# Patient Record
Sex: Female | Born: 1950
Health system: Southern US, Community
[De-identification: ages and names within clinical notes are randomized; demographics above are authoritative.]

## PROBLEM LIST (undated history)

## (undated) DIAGNOSIS — E785 Hyperlipidemia, unspecified: Secondary | ICD-10-CM

## (undated) DIAGNOSIS — R569 Unspecified convulsions: Secondary | ICD-10-CM

## (undated) DIAGNOSIS — I10 Essential (primary) hypertension: Secondary | ICD-10-CM

## (undated) HISTORY — PX: ABDOMINAL HYSTERECTOMY: SHX81

## (undated) HISTORY — PX: BREAST CYST ASPIRATION: SHX578

---
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
## 1997-06-29 ENCOUNTER — Other Ambulatory Visit: Admission: RE | Admit: 1997-06-29 | Discharge: 1997-06-29 | Payer: Self-pay | Admitting: Obstetrics

## 1997-09-03 ENCOUNTER — Other Ambulatory Visit: Admission: RE | Admit: 1997-09-03 | Discharge: 1997-09-03 | Payer: Self-pay | Admitting: Nephrology

## 1998-01-28 ENCOUNTER — Ambulatory Visit (HOSPITAL_COMMUNITY): Admission: RE | Admit: 1998-01-28 | Discharge: 1998-01-28 | Payer: Self-pay | Admitting: Obstetrics

## 1998-01-28 ENCOUNTER — Encounter: Payer: Self-pay | Admitting: Obstetrics

## 1999-01-31 ENCOUNTER — Ambulatory Visit (HOSPITAL_COMMUNITY): Admission: RE | Admit: 1999-01-31 | Discharge: 1999-01-31 | Payer: Self-pay | Admitting: Nephrology

## 1999-02-17 ENCOUNTER — Other Ambulatory Visit: Admission: RE | Admit: 1999-02-17 | Discharge: 1999-02-17 | Payer: Self-pay | Admitting: Nephrology

## 1999-03-24 ENCOUNTER — Encounter: Payer: Self-pay | Admitting: Emergency Medicine

## 1999-03-24 ENCOUNTER — Emergency Department (HOSPITAL_COMMUNITY): Admission: EM | Admit: 1999-03-24 | Discharge: 1999-03-24 | Payer: Self-pay | Admitting: Emergency Medicine

## 1999-04-22 ENCOUNTER — Encounter: Admission: RE | Admit: 1999-04-22 | Discharge: 1999-05-13 | Payer: Self-pay

## 1999-07-28 ENCOUNTER — Other Ambulatory Visit: Admission: RE | Admit: 1999-07-28 | Discharge: 1999-07-28 | Payer: Self-pay | Admitting: Obstetrics

## 2000-02-01 ENCOUNTER — Encounter: Payer: Self-pay | Admitting: Obstetrics

## 2000-02-01 ENCOUNTER — Ambulatory Visit (HOSPITAL_COMMUNITY): Admission: RE | Admit: 2000-02-01 | Discharge: 2000-02-01 | Payer: Self-pay | Admitting: Obstetrics

## 2000-02-24 ENCOUNTER — Encounter: Payer: Self-pay | Admitting: Nephrology

## 2000-02-24 ENCOUNTER — Ambulatory Visit (HOSPITAL_COMMUNITY): Admission: RE | Admit: 2000-02-24 | Discharge: 2000-02-24 | Payer: Self-pay | Admitting: Nephrology

## 2000-02-27 ENCOUNTER — Encounter: Payer: Self-pay | Admitting: Nephrology

## 2000-02-27 ENCOUNTER — Ambulatory Visit (HOSPITAL_COMMUNITY): Admission: RE | Admit: 2000-02-27 | Discharge: 2000-02-27 | Payer: Self-pay | Admitting: Nephrology

## 2000-03-12 ENCOUNTER — Ambulatory Visit (HOSPITAL_COMMUNITY): Admission: RE | Admit: 2000-03-12 | Discharge: 2000-03-12 | Payer: Self-pay | Admitting: Nephrology

## 2000-03-12 ENCOUNTER — Encounter: Payer: Self-pay | Admitting: Nephrology

## 2001-02-01 ENCOUNTER — Ambulatory Visit (HOSPITAL_COMMUNITY): Admission: RE | Admit: 2001-02-01 | Discharge: 2001-02-01 | Payer: Self-pay | Admitting: Obstetrics

## 2001-02-01 ENCOUNTER — Encounter: Payer: Self-pay | Admitting: Obstetrics

## 2002-02-03 ENCOUNTER — Ambulatory Visit (HOSPITAL_COMMUNITY): Admission: RE | Admit: 2002-02-03 | Discharge: 2002-02-03 | Payer: Self-pay | Admitting: Obstetrics

## 2002-02-03 ENCOUNTER — Encounter: Payer: Self-pay | Admitting: Obstetrics

## 2002-09-22 ENCOUNTER — Other Ambulatory Visit: Admission: RE | Admit: 2002-09-22 | Discharge: 2002-09-22 | Payer: Self-pay | Admitting: Cardiology

## 2003-02-01 ENCOUNTER — Emergency Department (HOSPITAL_COMMUNITY): Admission: EM | Admit: 2003-02-01 | Discharge: 2003-02-01 | Payer: Self-pay | Admitting: Emergency Medicine

## 2003-10-13 ENCOUNTER — Other Ambulatory Visit: Admission: RE | Admit: 2003-10-13 | Discharge: 2003-10-13 | Payer: Self-pay | Admitting: Cardiology

## 2003-10-13 ENCOUNTER — Other Ambulatory Visit: Admission: RE | Admit: 2003-10-13 | Discharge: 2003-10-13 | Payer: Self-pay | Admitting: Nephrology

## 2003-12-17 ENCOUNTER — Ambulatory Visit (HOSPITAL_COMMUNITY): Admission: RE | Admit: 2003-12-17 | Discharge: 2003-12-17 | Payer: Self-pay | Admitting: Obstetrics

## 2004-11-11 ENCOUNTER — Other Ambulatory Visit: Admission: RE | Admit: 2004-11-11 | Discharge: 2004-11-11 | Payer: Self-pay | Admitting: Nephrology

## 2004-12-19 ENCOUNTER — Ambulatory Visit (HOSPITAL_COMMUNITY): Admission: RE | Admit: 2004-12-19 | Discharge: 2004-12-19 | Payer: Self-pay | Admitting: Obstetrics

## 2005-08-02 ENCOUNTER — Encounter: Admission: RE | Admit: 2005-08-02 | Discharge: 2005-08-02 | Payer: Self-pay | Admitting: Nephrology

## 2005-09-07 ENCOUNTER — Encounter: Admission: RE | Admit: 2005-09-07 | Discharge: 2005-09-07 | Payer: Self-pay | Admitting: Nephrology

## 2005-12-08 ENCOUNTER — Other Ambulatory Visit: Admission: RE | Admit: 2005-12-08 | Discharge: 2005-12-08 | Payer: Self-pay | Admitting: Cardiology

## 2005-12-18 ENCOUNTER — Ambulatory Visit (HOSPITAL_COMMUNITY): Admission: RE | Admit: 2005-12-18 | Discharge: 2005-12-18 | Payer: Self-pay | Admitting: Obstetrics

## 2006-12-20 ENCOUNTER — Ambulatory Visit (HOSPITAL_COMMUNITY): Admission: RE | Admit: 2006-12-20 | Discharge: 2006-12-20 | Payer: Self-pay | Admitting: Obstetrics

## 2007-12-23 ENCOUNTER — Ambulatory Visit (HOSPITAL_COMMUNITY): Admission: RE | Admit: 2007-12-23 | Discharge: 2007-12-23 | Payer: Self-pay | Admitting: Obstetrics

## 2008-01-21 ENCOUNTER — Encounter: Admission: RE | Admit: 2008-01-21 | Discharge: 2008-01-21 | Payer: Self-pay | Admitting: Nephrology

## 2008-04-09 ENCOUNTER — Encounter: Admission: RE | Admit: 2008-04-09 | Discharge: 2008-04-21 | Payer: Self-pay | Admitting: Otolaryngology

## 2008-12-23 ENCOUNTER — Ambulatory Visit (HOSPITAL_COMMUNITY): Admission: RE | Admit: 2008-12-23 | Discharge: 2008-12-23 | Payer: Self-pay | Admitting: Obstetrics

## 2009-12-27 ENCOUNTER — Ambulatory Visit (HOSPITAL_COMMUNITY): Admission: RE | Admit: 2009-12-27 | Discharge: 2009-12-27 | Payer: Self-pay | Admitting: Obstetrics

## 2010-12-02 ENCOUNTER — Other Ambulatory Visit (HOSPITAL_COMMUNITY): Payer: Self-pay | Admitting: Obstetrics

## 2010-12-02 DIAGNOSIS — Z1231 Encounter for screening mammogram for malignant neoplasm of breast: Secondary | ICD-10-CM

## 2011-01-02 ENCOUNTER — Ambulatory Visit (HOSPITAL_COMMUNITY)
Admission: RE | Admit: 2011-01-02 | Discharge: 2011-01-02 | Disposition: A | Discharge status: Home / Self Care | Payer: Medicare Other | Source: Ambulatory Visit | Attending: Obstetrics | Admitting: Obstetrics

## 2011-01-02 DIAGNOSIS — Z1231 Encounter for screening mammogram for malignant neoplasm of breast: Secondary | ICD-10-CM | POA: Insufficient documentation

## 2011-05-08 ENCOUNTER — Other Ambulatory Visit: Payer: Self-pay | Admitting: Specialist

## 2011-05-08 DIAGNOSIS — H5340 Unspecified visual field defects: Secondary | ICD-10-CM

## 2011-05-08 LAB — CREATININE, SERUM: Creat: 1.4 mg/dL — ABNORMAL HIGH (ref 0.50–1.10)

## 2011-05-08 LAB — BUN: BUN: 27 mg/dL — ABNORMAL HIGH (ref 6–23)

## 2011-05-09 ENCOUNTER — Ambulatory Visit
Admission: RE | Admit: 2011-05-09 | Discharge: 2011-05-09 | Disposition: A | Discharge status: Home / Self Care | Payer: Medicare Other | Source: Ambulatory Visit | Attending: Specialist | Admitting: Specialist

## 2011-05-09 DIAGNOSIS — H5340 Unspecified visual field defects: Secondary | ICD-10-CM

## 2011-05-09 MED ORDER — GADOBENATE DIMEGLUMINE 529 MG/ML IV SOLN
15.0000 mL | Freq: Once | INTRAVENOUS | Status: AC | PRN
  Administered 2011-05-09: 15 mL via INTRAVENOUS

## 2011-12-21 ENCOUNTER — Other Ambulatory Visit (HOSPITAL_COMMUNITY): Payer: Self-pay | Admitting: Obstetrics

## 2011-12-21 DIAGNOSIS — Z1231 Encounter for screening mammogram for malignant neoplasm of breast: Secondary | ICD-10-CM

## 2012-01-23 ENCOUNTER — Ambulatory Visit (HOSPITAL_COMMUNITY)
Admission: RE | Admit: 2012-01-23 | Discharge: 2012-01-23 | Disposition: A | Discharge status: Home / Self Care | Payer: Medicare Other | Source: Ambulatory Visit | Attending: Obstetrics | Admitting: Obstetrics

## 2012-01-23 DIAGNOSIS — Z1231 Encounter for screening mammogram for malignant neoplasm of breast: Secondary | ICD-10-CM | POA: Insufficient documentation

## 2012-12-16 ENCOUNTER — Other Ambulatory Visit (HOSPITAL_COMMUNITY): Payer: Self-pay | Admitting: Obstetrics

## 2012-12-16 DIAGNOSIS — Z1231 Encounter for screening mammogram for malignant neoplasm of breast: Secondary | ICD-10-CM

## 2013-01-23 ENCOUNTER — Ambulatory Visit (HOSPITAL_COMMUNITY)
Admission: RE | Admit: 2013-01-23 | Discharge: 2013-01-23 | Disposition: A | Discharge status: Home / Self Care | Payer: Medicare Other | Source: Ambulatory Visit | Attending: Obstetrics | Admitting: Obstetrics

## 2013-01-23 DIAGNOSIS — Z1231 Encounter for screening mammogram for malignant neoplasm of breast: Secondary | ICD-10-CM

## 2013-02-01 ENCOUNTER — Emergency Department (HOSPITAL_COMMUNITY)
Admission: EM | Admit: 2013-02-01 | Discharge: 2013-02-01 | Disposition: A | Discharge status: Home / Self Care | Payer: Medicare Other | Attending: Emergency Medicine | Admitting: Emergency Medicine

## 2013-02-01 ENCOUNTER — Encounter (HOSPITAL_COMMUNITY): Payer: Self-pay | Admitting: Emergency Medicine

## 2013-02-01 DIAGNOSIS — R569 Unspecified convulsions: Secondary | ICD-10-CM | POA: Insufficient documentation

## 2013-02-01 DIAGNOSIS — Y929 Unspecified place or not applicable: Secondary | ICD-10-CM | POA: Insufficient documentation

## 2013-02-01 DIAGNOSIS — I10 Essential (primary) hypertension: Secondary | ICD-10-CM | POA: Insufficient documentation

## 2013-02-01 DIAGNOSIS — H1132 Conjunctival hemorrhage, left eye: Secondary | ICD-10-CM

## 2013-02-01 DIAGNOSIS — Y939 Activity, unspecified: Secondary | ICD-10-CM | POA: Insufficient documentation

## 2013-02-01 DIAGNOSIS — S058X9A Other injuries of unspecified eye and orbit, initial encounter: Secondary | ICD-10-CM | POA: Insufficient documentation

## 2013-02-01 DIAGNOSIS — S0502XA Injury of conjunctiva and corneal abrasion without foreign body, left eye, initial encounter: Secondary | ICD-10-CM

## 2013-02-01 DIAGNOSIS — IMO0002 Reserved for concepts with insufficient information to code with codable children: Secondary | ICD-10-CM | POA: Insufficient documentation

## 2013-02-01 DIAGNOSIS — H1189 Other specified disorders of conjunctiva: Secondary | ICD-10-CM | POA: Insufficient documentation

## 2013-02-01 DIAGNOSIS — E785 Hyperlipidemia, unspecified: Secondary | ICD-10-CM | POA: Insufficient documentation

## 2013-02-01 HISTORY — DX: Essential (primary) hypertension: I10

## 2013-02-01 HISTORY — DX: Unspecified convulsions: R56.9

## 2013-02-01 HISTORY — DX: Hyperlipidemia, unspecified: E78.5

## 2013-02-01 MED ORDER — CIPROFLOXACIN HCL 0.3 % OP SOLN
2.0000 [drp] | Freq: Once | OPHTHALMIC | Status: AC
  Administered 2013-02-01: 2 [drp] via OPHTHALMIC
  Filled 2013-02-01: qty 2.5

## 2013-02-01 MED ORDER — TETRACAINE HCL 0.5 % OP SOLN
2.0000 [drp] | Freq: Once | OPHTHALMIC | Status: DC
  Filled 2013-02-01: qty 2

## 2013-02-01 MED ORDER — FLUORESCEIN SODIUM 1 MG OP STRP
1.0000 | ORAL_STRIP | Freq: Once | OPHTHALMIC | Status: DC
  Filled 2013-02-01: qty 1

## 2013-02-01 NOTE — ED Provider Notes (Signed)
CSN: [id number]     Arrival date & time 02/01/13  1831 History  This chart was scribed for non-physician practitioner Michele Mcalpine, PA-C, working with Babette Relic, MD by Zettie Pho, ED Scribe. This patient was seen in room TR07C/TR07C and the patient's care was started at 7:23 PM.    Chief Complaint  Patient presents with  . Eye Injury   The history is provided by the patient and the spouse. No language interpreter was used.   HPI Comments: Deborah Webb is a 62 y.o. female with a history of seizures who presents to the Emergency Department complaining of an injury to the left eye that occurred PTA when she states that she was hit by a metal clothes hanger. She reports associated redness to the left eye. She denies pain to the area or visual disturbance. Patient reports that she does currently have an ophthalmologist.   Past Medical History  Diagnosis Date  . Seizures   . Hypertension   . Hyperlipidemia    History reviewed. No pertinent past surgical history. No family history on file. History  Substance Use Topics  . Smoking status: Never Smoker   . Smokeless tobacco: Not on file  . Alcohol Use: No   OB History   Grav Para Term Preterm Abortions TAB SAB Ect Mult Living                 Review of Systems  A complete 10 system review of systems was obtained and all systems are negative except as noted in the HPI and PMH.   Allergies  Review of patient's allergies indicates no known allergies.  Home Medications  No current outpatient prescriptions on file.  Triage Vitals: BP 122/75  Pulse 72  Temp(Src) 97.9 F (36.6 C) (Oral)  Resp 16  SpO2 98%  Physical Exam  Nursing note and vitals reviewed. Constitutional: She is oriented to person, place, and time. She appears well-developed and well-nourished. No distress.  HENT:  Head: Normocephalic and atraumatic.  Mouth/Throat: Oropharynx is clear and moist.  Eyes: Conjunctivae and EOM are normal. Pupils are equal,  round, and reactive to light.  Left eye subconjunctival hemorrhage noted medially. Eye stained, left corneal abrasion seen. No foreign bodies.   Neck: Normal range of motion. Neck supple.  Cardiovascular: Normal rate, regular rhythm and normal heart sounds.   Pulmonary/Chest: Effort normal and breath sounds normal. No respiratory distress.  Musculoskeletal: Normal range of motion. She exhibits no edema.  Neurological: She is alert and oriented to person, place, and time. No sensory deficit.  Skin: Skin is warm and dry.  Psychiatric: She has a normal mood and affect. Her behavior is normal.    ED Course  Procedures (including critical care time)  DIAGNOSTIC STUDIES: Oxygen Saturation is 98% on room air, normal by my interpretation.    COORDINATION OF CARE: 7:24 PM- Ordered and applied tetracaine drops and a fluorescein strip. Discussed that there is a small scratch on the eye. Will discharge patient with antibiotic eye drops. Advised patient to follow up with her ophthalmologist next week. Discussed treatment plan with patient at bedside and patient verbalized agreement.     Labs Review Labs Reviewed - No data to display Imaging Review No results found.  EKG Interpretation   None       MDM   1. Corneal abrasion, left, initial encounter   2. Subconjunctival hematoma, left    L eye stained, corneal abrasion seen. No FB. Also with subconjunctival hemorrhage.  No hyphema. No vision changes. Well appearing, NAD, normal vitals. Stable for discharge. She has an ophthalmologist who she will followup with next week. Return precautions given. Patient states understanding of treatment care plan and is agreeable.   I personally performed the services described in this documentation, which was scribed in my presence. The recorded information has been reviewed and is accurate.     Illene Labrador, PA-C 02/01/13 [phone]

## 2013-02-01 NOTE — ED Notes (Addendum)
Pt was hit on the L eye by a metal clothes rack.  Pt has redness/blood on left eye.  Pt denies pain and any other injury to face.  Pt denies visual disturbance.

## 2013-02-01 NOTE — Discharge Instructions (Signed)
Use antibiotic eye drops 4 times daily for 5 days. Follow up with your eye doctor, call Monday to schedule an appointment.  Corneal Abrasion The cornea is the clear covering at the front and center of the eye. When looking at the colored portion (iris) of the eye, you are looking through that person's cornea.  This very thin tissue is made up of many layers. The surface layer is a single layer of cells called the corneal epithelium. This is one of the most sensitive tissues in the body. If a scratch or injury causes the corneal epithelium to come off, it is called a corneal abrasion. If the injury extends to the tissues below the epithelium, the condition is called a corneal ulcer.  CAUSES   Scratches.  Trauma.  Foreign body in the eye.  Some people have recurrences of abrasions in the area of the original injury even after they heal. This is called recurrent erosion syndrome. Recurrent erosion syndromes generally improve and go away with time. SYMPTOMS   Eye pain.  Difficulty or inability to keep the injured eye open.  The eye becomes very sensitive to light.  Recurrent erosions tend to happen suddenly, first thing in the morning  usually upon awakening and opening the eyes. DIAGNOSIS  Your eye professional can diagnose a corneal abrasion during an eye exam. Dye is usually placed in the eye using a drop or a small paper strip moistened by the patient's tears. When the eye is examined with a special light, the abrasion shows up clearly because of the dye. TREATMENT   Small abrasions may be treated with antibiotic drops or ointment alone.  Usually a pressure patch is specially applied. Pressure patches prevent the eye from blinking, allowing the corneal epithelium to heal. Because blinking is less, a pressure patch also reduces the amount of pain present in the eye during healing. Most corneal abrasions heal within 2-3 days with no effect on vision. WARNING: Do not drive or operate  machinery while your eye is patched. Your ability to judge distances is impaired.  If abrasion becomes infected and spreads to the deeper tissues of the cornea, a corneal ulcer can result. This is serious because it can cause corneal scarring. Corneal scars interfere with light passing through the cornea, and cause a loss of vision in the involved eye.  If your caregiver has given you a follow-up appointment, it is very important to keep that appointment. Not keeping the appointment could result in a severe eye infection or permanent loss of vision. If there is any problem keeping the appointment, you must call back to this facility for assistance. SEEK MEDICAL CARE IF:   You have pain, light sensitivity and a scratchy feeling in one eye (or both).  Your pressure patch keeps loosening up and you can blink your eye under the patch after treatment.  Any kind of discharge develops from the involved eye after treatment or if the lids stick together in the morning.  You have the same symptoms in the morning as you did with the original abrasion days, weeks or months after the abrasion healed. MAKE SURE YOU:   Understand these instructions.  Will watch your condition.  Will get help right away if you are not doing well or get worse. Document Released: 02/25/2000 Document Revised: 05/22/2011 Document Reviewed: 10/03/2007 [facility] Patient Information 2014 [place].  Subconjunctival Hemorrhage A subconjunctival hemorrhage is a bright red patch covering a portion of the white of the eye. The white  part of the eye is called the sclera, and it is covered by a thin membrane called the conjunctiva. This membrane is clear, except for tiny blood vessels that you can see with the naked eye. When your eye is irritated or inflamed and becomes red, it is because the vessels in the conjunctiva are swollen. Sometimes, a blood vessel in the conjunctiva can break and bleed. When this occurs, the blood  builds up between the conjunctiva and the sclera, and spreads out to create a red area. The red spot may be very small at first. It may then spread to cover a larger part of the surface of the eye, or even all of the visible white part of the eye. In almost all cases, the blood will go away and the eye will become white again. Before completely dissolving, however, the red area may spread. It may also become brownish-yellow in color, before going away. If a lot of blood collects under the conjunctiva, it may look like a bulge on the surface of the eye. This looks scary, but it will also eventually flatten out and go away. Subconjunctival hemorrhages do not cause pain, but if swollen, may cause a feeling of irritation. There is no effect on vision.  CAUSES   The most common cause is mild trauma (rubbing the eye, irritation).  Subconjunctival hemorrhages can happen because of coughing or straining (lifting heavy objects), vomiting, or sneezing.  In some cases, your doctor may want to check your blood pressure. High blood pressure can also cause a sunconjunctival hemorrhage.  Severe trauma or blunt injuries.  Diseases that affect blood clotting (hemophilia, leukemia).  Abnormalities of blood vessels behind the eye (carotid cavernous sinus fistula).  Tumors behind the eye.  Certain drugs (aspirin, coumadin, heparin).  Recent eye surgery. HOME CARE INSTRUCTIONS   Do not worry about the appearance of your eye. You may continue your usual activities.  Often, follow-up is not necessary. SEEK MEDICAL CARE IF:   Your eye becomes painful.  The bleeding does not disappear within 3 weeks.  Bleeding occurs elsewhere, for example, under the skin, in the mouth, or in the other eye.  You have recurring subconjunctival hemorrhages. SEEK IMMEDIATE MEDICAL CARE IF:   Your vision changes or you have difficulty seeing.  You develop severe headache, persistent vomiting, confusion, or abnormal  drowsiness (lethargy).  Your eye seems to bulge or protrude from the eye socket.  You notice the sudden appearance of bruises, or have spontaneous bleeding elsewhere on your body. Document Released: 02/27/2005 Document Revised: 05/22/2011 Document Reviewed: 01/25/2009 [facility] Patient Information 2014 [place].

## 2013-02-02 NOTE — ED Provider Notes (Signed)
Medical screening examination/treatment/procedure(s) were performed by non-physician practitioner and as supervising physician I was immediately available for consultation/collaboration.  Babette Relic, MD 02/02/13 [phone]

## 2013-06-07 ENCOUNTER — Encounter (HOSPITAL_COMMUNITY): Payer: Self-pay | Admitting: Emergency Medicine

## 2013-06-07 ENCOUNTER — Emergency Department (HOSPITAL_COMMUNITY)
Admission: EM | Admit: 2013-06-07 | Discharge: 2013-06-08 | Disposition: A | Discharge status: Home / Self Care | Payer: Medicare Other | Attending: Emergency Medicine | Admitting: Emergency Medicine

## 2013-06-07 DIAGNOSIS — E785 Hyperlipidemia, unspecified: Secondary | ICD-10-CM | POA: Insufficient documentation

## 2013-06-07 DIAGNOSIS — M25572 Pain in left ankle and joints of left foot: Secondary | ICD-10-CM

## 2013-06-07 DIAGNOSIS — Y929 Unspecified place or not applicable: Secondary | ICD-10-CM | POA: Insufficient documentation

## 2013-06-07 DIAGNOSIS — I10 Essential (primary) hypertension: Secondary | ICD-10-CM | POA: Insufficient documentation

## 2013-06-07 DIAGNOSIS — Y9389 Activity, other specified: Secondary | ICD-10-CM | POA: Insufficient documentation

## 2013-06-07 DIAGNOSIS — S8990XA Unspecified injury of unspecified lower leg, initial encounter: Secondary | ICD-10-CM | POA: Insufficient documentation

## 2013-06-07 DIAGNOSIS — S99929A Unspecified injury of unspecified foot, initial encounter: Principal | ICD-10-CM

## 2013-06-07 DIAGNOSIS — S99919A Unspecified injury of unspecified ankle, initial encounter: Principal | ICD-10-CM

## 2013-06-07 DIAGNOSIS — X500XXA Overexertion from strenuous movement or load, initial encounter: Secondary | ICD-10-CM | POA: Insufficient documentation

## 2013-06-07 DIAGNOSIS — Z79899 Other long term (current) drug therapy: Secondary | ICD-10-CM | POA: Insufficient documentation

## 2013-06-07 DIAGNOSIS — G40909 Epilepsy, unspecified, not intractable, without status epilepticus: Secondary | ICD-10-CM | POA: Insufficient documentation

## 2013-06-07 NOTE — ED Notes (Signed)
Patient presents stating she tripped and hurt her left ankle.  Ice given to apply to ankle.  Ankle swollen

## 2013-06-07 NOTE — ED Provider Notes (Signed)
CSN: [id number]     Arrival date & time 06/07/13  2251 History  This chart was scribed for non-physician practitioner, Vernie Murders, PA-C working with Dot Lanes, MD by Frederich Balding, ED scribe. This patient was seen in room TR04C/TR04C and the patient's care was started at 11:56 PM.     Chief Complaint  Patient presents with  . Ankle Pain   The history is provided by the patient. No language interpreter was used.   HPI Comments: Deborah Webb is a 63 y.o. female with a PMH of HTN, seizurs and hyperlipidemia who presents to the Emergency Department complaining of left ankle injury that occurred earlier today around 2:30 PM. Pt tripped and twisted her left ankle. She has sudden onset left ankle pain with associated swelling. No other injuries. No head injury or LOC. Bearing weight and certain movements worsen the pain. Pt has not taken any medications for the pain. Denies abdominal pain, hip pain, numbness, tingling, or loss of sensation. Denies history of ankle fractures or dislocations.    Past Medical History  Diagnosis Date  . Seizures   . Hypertension   . Hyperlipidemia    History reviewed. No pertinent past surgical history. History reviewed. No pertinent family history. History  Substance Use Topics  . Smoking status: Never Smoker   . Smokeless tobacco: Not on file  . Alcohol Use: No   OB History   Grav Para Term Preterm Abortions TAB SAB Ect Mult Living                 Review of Systems  Constitutional: Negative for fatigue.  Cardiovascular: Negative for leg swelling.  Gastrointestinal: Negative for nausea, vomiting and abdominal pain.  Musculoskeletal: Positive for arthralgias (left ankle) and joint swelling. Negative for back pain, gait problem, myalgias and neck pain.  Skin: Negative for color change and wound.  Neurological: Negative for weakness, numbness and headaches.  All other systems reviewed and are negative.   Allergies  Review of patient's  allergies indicates no known allergies.  Home Medications   Current Outpatient Rx  Name  Route  Sig  Dispense  Refill  . atorvastatin (LIPITOR) 10 MG tablet   Oral   Take 10 mg by mouth daily.         . Multiple Vitamins-Minerals (MULTIVITAMIN PO)   Oral   Take 1 tablet by mouth daily.         . phenytoin (DILANTIN) 100 MG ER capsule   Oral   Take 100 mg by mouth 3 (three) times daily.         Marland Kitchen triamterene-hydrochlorothiazide (MAXZIDE-25) 37.5-25 MG per tablet   Oral   Take 1 tablet by mouth daily.          BP 164/97  Pulse 68  Temp(Src) 98.2 F (36.8 C) (Oral)  Resp 20  Ht 5\' 11"  (1.803 m)  Wt 164 lb (74.39 kg)  BMI 22.88 kg/m2  SpO2 100%  Filed Vitals:   06/07/13 2259 06/08/13 0046  BP: 164/97 166/84  Pulse: 68 72  Temp: 98.2 F (36.8 C) 98.4 F (36.9 C)  TempSrc: Oral Oral  Resp: 20 16  Height: 5\' 11"  (1.803 m)   Weight: 164 lb (74.39 kg)   SpO2: 100% 98%    Physical Exam  Nursing note and vitals reviewed. Constitutional: She is oriented to person, place, and time. She appears well-developed and well-nourished. No distress.  HENT:  Head: Normocephalic and atraumatic.  Eyes: EOM are normal.  Neck: Neck supple. No tracheal deviation present.  Cardiovascular: Normal rate.   Dorsalis pedis pulses present and equal bilaterally  Pulmonary/Chest: Effort normal. No respiratory distress.  Musculoskeletal: Normal range of motion. She exhibits edema and tenderness.       Feet:  Tenderness to palpation to the left lateral ankle with mild edema. No wounds, ecchymosis or erythema. No tenderness to palpation to the left foot, calf, knee, or hip. Strength 5/5 in the lower extremities bilaterally. Patient able to circumduct left ankle without limitations.   Neurological: She is alert and oriented to person, place, and time.  Sensation intact in the LE  Skin: Skin is warm and dry.  Psychiatric: She has a normal mood and affect. Her behavior is normal.     ED Course  Procedures (including critical care time)  DIAGNOSTIC STUDIES: Oxygen Saturation is 100% on RA, normal by my interpretation.    COORDINATION OF CARE: 12:00 AM-Discussed treatment plan which includes ibuprofen and xray with pt at bedside and pt agreed to plan.   Labs Review Labs Reviewed - No data to display Imaging Review Dg Ankle Complete Left  06/08/2013   CLINICAL DATA:  Lateral ankle pain  EXAM: LEFT ANKLE COMPLETE - 3+ VIEW  COMPARISON:  None.  FINDINGS: There is periarticular soft tissue swelling. No acute fracture or malalignment. Plantar and dorsal calcaneal spurs. Dorsal ossicle at the talonavicular joint.  IMPRESSION: No acute osseous findings.   Electronically Signed   By: Jorje Guild M.D.   On: 06/08/2013 01:31     EKG Interpretation None      MDM   Deborah Webb is a 63 y.o. female is a 63 y.o. female with a PMH of HTN, seizurs and hyperlipidemia who presents to the Emergency Department complaining of left ankle injury that occurred earlier today around 2:30 PM. Etiology of ankle pain possibly due to sprain vs strain. X-rays negative for fracture or malalignment. Patient neurovascularly intact. Ankle brace applied. Discussed RICE method. BP elevated during ED visit (SBP 160). Encouraged to check BP and follow-up with PCP. Return precautions, discharge instructions, and follow-up was discussed with the patient before discharge.     Rechecks  1:45 AM = Patient able to ambulate however requires some assistance. Patient has been taking HTN medications. Has crutches at home. Husband in ED.    Discharge Medication List as of 06/08/2013  1:56 AM    START taking these medications   Details  HYDROcodone-acetaminophen (NORCO/VICODIN) 5-325 MG per tablet Take 1 tablet by mouth every 4 (four) hours as needed., Starting 06/08/2013, Until Discontinued, Print        Final impressions: 1. Left ankle pain   2. HTN (hypertension)      Denman George   I personally performed the services described in this documentation, which was scribed in my presence. The recorded information has been reviewed and is accurate.        Lucila Maine, PA-C 06/10/13 [phone]

## 2013-06-08 ENCOUNTER — Emergency Department (HOSPITAL_COMMUNITY): Payer: Medicare Other

## 2013-06-08 MED ORDER — IBUPROFEN 400 MG PO TABS
800.0000 mg | ORAL_TABLET | Freq: Once | ORAL | Status: AC
  Administered 2013-06-08: 800 mg via ORAL
  Filled 2013-06-08: qty 2

## 2013-06-08 MED ORDER — HYDROCODONE-ACETAMINOPHEN 5-325 MG PO TABS: 1.0000 | ORAL_TABLET | ORAL | Status: DC | PRN

## 2013-06-08 NOTE — Discharge Instructions (Signed)
Take Ibuprofen for mild-moderate pain - take with food  Take Vicodin as needed for severe pain - Please be careful with this medication.  It can cause drowsiness.  Use caution while driving, operating machinery, drinking alcohol, or any other activities that may impair your physical or mental abilities.   Return to the emergency department if you develop any changing/worsening condition, increased swelling, weakness, loss of sensation or any other concerns (please read additional information regarding your condition below)      Ankle Pain Ankle pain is a common symptom. The bones, cartilage, tendons, and muscles of the ankle joint perform a lot of work each day. The ankle joint holds your body weight and allows you to move around. Ankle pain can occur on either side or back of 1 or both ankles. Ankle pain may be sharp and burning or dull and aching. There may be tenderness, stiffness, redness, or warmth around the ankle. The pain occurs more often when a person walks or puts pressure on the ankle. CAUSES  There are many reasons ankle pain can develop. It is important to work with your caregiver to identify the cause since many conditions can impact the bones, cartilage, muscles, and tendons. Causes for ankle pain include:  Injury, including a break (fracture), sprain, or strain often due to a fall, sports, or a high-impact activity.  Swelling (inflammation) of a tendon (tendonitis).  Achilles tendon rupture.  Ankle instability after repeated sprains and strains.  Poor foot alignment.  Pressure on a nerve (tarsal tunnel syndrome).  Arthritis in the ankle or the lining of the ankle.  Crystal formation in the ankle (gout or pseudogout). DIAGNOSIS  A diagnosis is based on your medical history, your symptoms, results of your physical exam, and results of diagnostic tests. Diagnostic tests may include X-ray exams or a computerized magnetic scan (magnetic resonance imaging, MRI). TREATMENT    Treatment will depend on the cause of your ankle pain and may include:  Keeping pressure off the ankle and limiting activities.  Using crutches or other walking support (a cane or brace).  Using rest, ice, compression, and elevation.  Participating in physical therapy or home exercises.  Wearing shoe inserts or special shoes.  Losing weight.  Taking medications to reduce pain or swelling or receiving an injection.  Undergoing surgery. HOME CARE INSTRUCTIONS   Only take over-the-counter or prescription medicines for pain, discomfort, or fever as directed by your caregiver.  Put ice on the injured area.  Put ice in a plastic bag.  Place a towel between your skin and the bag.  Leave the ice on for 15-20 minutes at a time, 03-04 times a day.  Keep your leg raised (elevated) when possible to lessen swelling.  Avoid activities that cause ankle pain.  Follow specific exercises as directed by your caregiver.  Record how often you have ankle pain, the location of the pain, and what it feels like. This information may be helpful to you and your caregiver.  Ask your caregiver about returning to work or sports and whether you should drive.  Follow up with your caregiver for further examination, therapy, or testing as directed. SEEK MEDICAL CARE IF:   Pain or swelling continues or worsens beyond 1 week.  You have an oral temperature above 102 F (38.9 C).  You are feeling unwell or have chills.  You are having an increasingly difficult time with walking.  You have loss of sensation or other new symptoms.  You have questions or  concerns. MAKE SURE YOU:   Understand these instructions.  Will watch your condition.  Will get help right away if you are not doing well or get worse. Document Released: 08/17/2009 Document Revised: 05/22/2011 Document Reviewed: 08/17/2009 [facility] Patient Information 2014 [place].  RICE: Routine Care for Injuries The routine care  of many injuries includes Rest, Ice, Compression, and Elevation (RICE). HOME CARE INSTRUCTIONS  Rest is needed to allow your body to heal. Routine activities can usually be resumed when comfortable. Injured tendons and bones can take up to 6 weeks to heal. Tendons are the cord-like structures that attach muscle to bone.  Ice following an injury helps keep the swelling down and reduces pain.  Put ice in a plastic bag.  Place a towel between your skin and the bag.  Leave the ice on for 15-20 minutes, 03-04 times a day. Do this while awake, for the first 24 to 48 hours. After that, continue as directed by your caregiver.  Compression helps keep swelling down. It also gives support and helps with discomfort. If an elastic bandage has been applied, it should be removed and reapplied every 3 to 4 hours. It should not be applied tightly, but firmly enough to keep swelling down. Watch fingers or toes for swelling, bluish discoloration, coldness, numbness, or excessive pain. If any of these problems occur, remove the bandage and reapply loosely. Contact your caregiver if these problems continue.  Elevation helps reduce swelling and decreases pain. With extremities, such as the arms, hands, legs, and feet, the injured area should be placed near or above the level of the heart, if possible. SEEK IMMEDIATE MEDICAL CARE IF:  You have persistent pain and swelling.  You develop redness, numbness, or unexpected weakness.  Your symptoms are getting worse rather than improving after several days. These symptoms may indicate that further evaluation or further X-rays are needed. Sometimes, X-rays may not show a small broken bone (fracture) until 1 week or 10 days later. Make a follow-up appointment with your caregiver. Ask when your X-ray results will be ready. Make sure you get your X-ray results. Document Released: 06/11/2000 Document Revised: 05/22/2011 Document Reviewed: 07/29/2010 [facility] Patient  Information 2014 [place], [place].  Emergency Department Resource Guide 1) Find a Doctor and Pay Out of Pocket Although you won't have to find out who is covered by your insurance plan, it is a good idea to ask around and get recommendations. You will then need to call the office and see if the doctor you have chosen will accept you as a new patient and what types of options they offer for patients who are self-pay. Some doctors offer discounts or will set up payment plans for their patients who do not have insurance, but you will need to ask so you aren't surprised when you get to your appointment.  2) Contact Your Local Health Department Not all health departments have doctors that can see patients for sick visits, but many do, so it is worth a call to see if yours does. If you don't know where your local health department is, you can check in your phone book. The CDC also has a tool to help you locate your state's health department, and many state websites also have listings of all of their local health departments.  3) Find a [facility] If your illness is not likely to be very severe or complicated, you may want to try a walk in clinic. These are popping up all over the country  in pharmacies, drugstores, and shopping centers. They're usually staffed by nurse practitioners or physician assistants that have been trained to treat common illnesses and complaints. They're usually fairly quick and inexpensive. However, if you have serious medical issues or chronic medical problems, these are probably not your best option.  No Primary Care Doctor: - Call Health Connect at  [phone] - they can help you locate a primary care doctor that  accepts your insurance, provides certain services, etc. - Physician Referral Service- [phone]  Chronic Pain Problems: Organization         Address  Phone   Notes  [facility]  [phone] Patients need to be referred by their primary care  doctor.   Medication Assistance: Organization         Address  Phone   Notes  [facility] Medication [facility] [address] [place], [place] [address] [phone] --Must be a resident of [facility] -- Must have NO insurance coverage whatsoever (no Medicaid/ Medicare, etc.) -- The pt. MUST have a primary care doctor that directs their care regularly and follows them in the community   MedAssist  [phone]   [employer]  [phone]    Agencies that provide inexpensive medical care: Organization         Address  Phone   Notes  [place]  [phone]   [facility]    [phone]   [facility] [place], [place] [address] [phone]   [place] [address] [place] [phone]   Planned Parenthood    [phone]   [facility]    [phone]   [place] and [place] [address], [place] Phone:  [phone], Fax:  [phone] Hours of Operation:  9 am - 6 pm, M-F.  Also accepts Medicaid/Medicare and self-pay.  [facility] for [place], [address], [place] Phone: [phone], Fax: [phone]. Hours of Operation:  8:30 am - 5:30 pm, M-F.  Also accepts Medicaid and self-pay.  [facility] [place] [address], [place] Phone: [phone]   [place], [place], [place] [phone], Ext. [phone] Mondays & Thursdays: 7-9 AM.  First 15 patients are seen on a first come, first serve basis.    [place] Providers:  Organization         Address  Phone   Notes  [facility] [address], [place] [phone] Also accepts self-pay patients.  [facility] [id number] [place], [place]  [phone]   [place], [address], [place] [phone]   [facility] [address] [place] [phone]   Lucianne Lei [address], [place]   [phone] Only accepts [place] Access [place] patients after they have their name applied to their card.   Self-Pay (no insurance) in [facility]:  Organization         Address  Phone   Notes  Sickle Cell Patients, [facility] [place] [phone]   [facility] [place] [phone]   [facility]gent [place]  [place], [address], [place] [phone]   [facility]/Dr. Osei-Bonsu  [address] [place] or [address], [place] [phone] Phone number for both [place] and [place] locations is the same.  [facility] [address] [place] [phone]   [facility] [address] [place] or [address] [phone]   [facility] [address], [place] [phone], phone; [phone], fax Sees patients 1st and 3rd Saturday of every month.  Must not qualify for public or private insurance (i.e. Medicaid, Medicare, [place] Health Choice, Veterans' Benefits)  Household income should be no more than 200% of the poverty level The clinic cannot treat you if you are pregnant or think you are pregnant  Sexually transmitted diseases are not treated at the clinic.    Dental Care: Organization         Address  Phone  Notes  [facility] Department of [facility] [place] [phone] Accepts children up to age 66 who are enrolled in [place] or [place]; pregnant women with a Medicaid card; and children who have applied for Medicaid or [place] Health Choice, but were declined, whose parents can pay a reduced fee at time of  service.  [facility] Department of [facility]  [address], [place] [phone] Accepts children up to age 20 who are enrolled in [place] or [place]; pregnant women with a Medicaid card; and children who have applied for Medicaid or [place] Health Choice, but were declined, whose parents can pay a reduced fee at time of service.  [facility] Adult Dental Access PROGRAM  [place] [phone] Patients are seen by appointment only. Walk-ins are not accepted. [place] will see patients 37 years of age and older. Monday - Tuesday (8am-5pm) Most Wednesdays (8:30-5pm) $30 per visit, cash only  [facility] Adult [facility] Access PRO[facility]  [address], [facility] [phone] Patients are seen by appointment only. Walk-ins are not accepted. [place] will see patients 41 years of age and older. One Wednesday Evening (Monthly: Volunteer Based).  $30 per visit, cash only  [place]  [phone] for adults; Children under age 51, call Graduate Pediatric Dentistry at [phone]. Children aged 37-14, please call [phone] to request a pediatric application.  Dental services are provided in all areas of dental care including fillings, crowns and bridges, complete and partial dentures, implants, gum treatment, root canals, and extractions. Preventive care is also provided. Treatment is provided to both adults and children. Patients are selected via a lottery and there is often a waiting list.   [facility] [address] [place]  [phone] [email or web address]   [facility] [address] [place], [place] [phone], Ext. [phone] Second and Fourth Thursday of each month, opens at 6:30 AM; Clinic ends at 9 AM.  Patients are seen on a first-come first-served basis, and a limited number are seen during each clinic.   [facility]  [address] Hillard Danker [place],  [place] [phone]   Eligibility Requirements You must have lived in [place], [place], or [place] counties for at least the last three months.   You cannot be eligible for state or federal sponsored [employer], including [employer], [place], or [employer].   You generally  cannot be eligible for healthcare insurance through your employer.    How to apply: Eligibility screenings are held every Tuesday and Wednesday afternoon from 1:00 pm until 4:00 pm. You do not need an appointment for the interview!  [facility] [address] [place], [place]   [place]  [place] Department  [place]  [phone]    Behavioral Health Resources in the Community: Intensive Outpatient Programs Organization         Address  Phone  Notes  [place]. [address], [place], [place] [phone]   [facility] Outpatient [address], [place], [place]   ADS: Alcohol & Drug Svcs [address], [place], [place]   [place] [address],  [place], [place] or [phone]   Substance Abuse Resources Organization         Address  Phone  Notes  Alcohol and Drug Services  [phone]   [place]  [phone]   [place]   Chinita Pester  [phone]   Residential & Outpatient Substance Abuse Program  [phone]   Psychological Services Organization         Address  Phone  Notes  [facility]  [place]  [phone]   [place] [address] [place] or [phone]    Mobile Crisis Teams Organization         Address  Phone  Notes  [facility], Mobile Crisis Care Unit  [phone]   [facility]  [address] [place], [place]   [place] Levels [address] [place] [phone]    Self-Help/Support Groups Organization         Address  Phone             Notes  [place] of [place] - variety of support groups  [place] Call for more information  Narcotics Anonymous (NA), [facility] [address], [employer] [place]  2 meetings at this location   Special educational needs teacher         Address  Phone  Notes  ASAP Residential Treatment [place],    [place]  [phone]   [facility]  [address] [place] [phone], [place], [place]   [place], [place] [phone] Admissions: 8am-3pm M-F  Incentives Substance [place] [address],    [place], [place] [phone]   [facility] [address] [place], [place], [place]   [facility] [address],  [place], [place]   [facility] Programs - Intensive Outpatient [address], [place], [place]   [facility] ([place]  [place], [place] [phone] or [phone]   [facility] Services (RTS) [address], [place], [place] Accepts Medicaid  [facility] [place] [address],  [place] [phone] Substance Abuse/Addiction Treatment   [facility] Organization         Address  Phone  Notes  [facility]  [phone]   Domenic Schwab, PhD [address] [place], [place]   [phone] or [phone]   [place]  [place], [place] [phone]   [facility] [address] [place], [place] [phone] Insurance/Medicaid/sponsorship through [facility] and Families [address] [place], [place] [phone]  [place], [place] [phone]    Dr. Adele Schilder  [phone]   [facility] Dept. 1) [address], [place] 2) [place] 3)  [address], [place] [phone]  [phone]   [place] [phone] or [phone] (After Hours)

## 2013-06-08 NOTE — ED Notes (Signed)
Pt to xray at this time.

## 2013-06-14 NOTE — ED Provider Notes (Signed)
Medical screening examination/treatment/procedure(s) were performed by non-physician practitioner and as supervising physician I was immediately available for consultation/collaboration.   Dot Lanes, MD 06/14/13 [phone]

## 2013-09-08 ENCOUNTER — Other Ambulatory Visit (HOSPITAL_COMMUNITY): Payer: Self-pay | Admitting: Pulmonary Disease

## 2013-09-08 ENCOUNTER — Ambulatory Visit (HOSPITAL_COMMUNITY)
Admission: RE | Admit: 2013-09-08 | Discharge: 2013-09-08 | Disposition: A | Discharge status: Home / Self Care | Payer: Medicare Other | Source: Ambulatory Visit | Attending: Pulmonary Disease | Admitting: Pulmonary Disease

## 2013-09-08 DIAGNOSIS — M1710 Unilateral primary osteoarthritis, unspecified knee: Secondary | ICD-10-CM | POA: Insufficient documentation

## 2013-09-08 DIAGNOSIS — IMO0002 Reserved for concepts with insufficient information to code with codable children: Secondary | ICD-10-CM | POA: Insufficient documentation

## 2013-09-08 DIAGNOSIS — R52 Pain, unspecified: Secondary | ICD-10-CM

## 2013-12-29 ENCOUNTER — Other Ambulatory Visit (HOSPITAL_COMMUNITY): Payer: Self-pay | Admitting: Obstetrics

## 2013-12-29 DIAGNOSIS — Z1231 Encounter for screening mammogram for malignant neoplasm of breast: Secondary | ICD-10-CM

## 2014-01-26 ENCOUNTER — Ambulatory Visit (HOSPITAL_COMMUNITY)
Admission: RE | Admit: 2014-01-26 | Discharge: 2014-01-26 | Disposition: A | Discharge status: Home / Self Care | Payer: Medicare Other | Source: Ambulatory Visit | Attending: Obstetrics | Admitting: Obstetrics

## 2014-01-26 DIAGNOSIS — Z1231 Encounter for screening mammogram for malignant neoplasm of breast: Secondary | ICD-10-CM | POA: Insufficient documentation

## 2015-04-27 ENCOUNTER — Other Ambulatory Visit: Payer: Self-pay | Admitting: Pulmonary Disease

## 2015-04-27 DIAGNOSIS — Z1231 Encounter for screening mammogram for malignant neoplasm of breast: Secondary | ICD-10-CM

## 2015-05-13 ENCOUNTER — Ambulatory Visit
Admission: RE | Admit: 2015-05-13 | Discharge: 2015-05-13 | Disposition: A | Discharge status: Home / Self Care | Payer: Medicare Other | Source: Ambulatory Visit | Attending: Pulmonary Disease | Admitting: Pulmonary Disease

## 2015-05-13 DIAGNOSIS — Z1231 Encounter for screening mammogram for malignant neoplasm of breast: Secondary | ICD-10-CM

## 2015-11-21 ENCOUNTER — Emergency Department (HOSPITAL_COMMUNITY)
Admission: EM | Admit: 2015-11-21 | Discharge: 2015-11-21 | Disposition: A | Discharge status: Home / Self Care | Payer: Medicare Other | Attending: Emergency Medicine | Admitting: Emergency Medicine

## 2015-11-21 ENCOUNTER — Emergency Department (HOSPITAL_COMMUNITY): Payer: Medicare Other

## 2015-11-21 ENCOUNTER — Encounter (HOSPITAL_COMMUNITY): Payer: Self-pay

## 2015-11-21 DIAGNOSIS — S6992XA Unspecified injury of left wrist, hand and finger(s), initial encounter: Secondary | ICD-10-CM | POA: Diagnosis present

## 2015-11-21 DIAGNOSIS — Y999 Unspecified external cause status: Secondary | ICD-10-CM | POA: Insufficient documentation

## 2015-11-21 DIAGNOSIS — W108XXA Fall (on) (from) other stairs and steps, initial encounter: Secondary | ICD-10-CM | POA: Diagnosis not present

## 2015-11-21 DIAGNOSIS — Y939 Activity, unspecified: Secondary | ICD-10-CM | POA: Diagnosis not present

## 2015-11-21 DIAGNOSIS — Y929 Unspecified place or not applicable: Secondary | ICD-10-CM | POA: Insufficient documentation

## 2015-11-21 DIAGNOSIS — I10 Essential (primary) hypertension: Secondary | ICD-10-CM | POA: Insufficient documentation

## 2015-11-21 DIAGNOSIS — S52502A Unspecified fracture of the lower end of left radius, initial encounter for closed fracture: Secondary | ICD-10-CM | POA: Diagnosis not present

## 2015-11-21 DIAGNOSIS — W010XXA Fall on same level from slipping, tripping and stumbling without subsequent striking against object, initial encounter: Secondary | ICD-10-CM

## 2015-11-21 MED ORDER — HYDROCODONE-ACETAMINOPHEN 5-325 MG PO TABS: 1.0000 | ORAL_TABLET | Freq: Four times a day (QID) | ORAL | 0 refills | Status: DC | PRN

## 2015-11-21 MED ORDER — HYDROCODONE-ACETAMINOPHEN 5-325 MG PO TABS
1.0000 | ORAL_TABLET | Freq: Once | ORAL | Status: AC
  Administered 2015-11-21: 1 via ORAL
  Filled 2015-11-21: qty 1

## 2015-11-21 NOTE — Discharge Instructions (Signed)
It was our pleasure to provide your ER care today - we hope that you feel better.  Elevate wrist. Ice/coldpack to sore area.   Take motrin or aleve as need for pain. You may also take hydrocodone as need for pain. No driving for the next 6 hours or when taking hydrocodone. Also, do not take tylenol or acetaminophen containing medication when taking hydrocodone.  Follow up with orthopedist in the coming week - call office tomorrow morning to arrange appointment.  Your blood pressure is high - continue your blood pressure medication, and follow up with your doctor in the coming week for recheck.  Return to ER if worse, severe pain, weak/faint, other concern.

## 2015-11-21 NOTE — ED Provider Notes (Signed)
[name] DEPT Provider Note   CSN: [id number] Arrival date & time: 11/21/15  1752     History   Chief Complaint Chief Complaint  Patient presents with  . Hand Injury    HPI Deborah Webb is a 65 y.o. female.  Patient fell onto outstretched left hand/wrist yesterday. States was a trip and fall. No faintness or dizziness. No syncope. Did not hit head. No headache. Has been ambulatory since. C/o left wrist pain, moderate, constant, persistent, worse w palpation. Is right hand dominant. Skin is intact. Denies numbness/weakness. Patient denies other pain or injury, states otherwise feels well, at baseline.  Hx hypertension, compliant w rx, denies cp, headache, sob or other c/o.    The history is provided by the patient and a relative.  Hand Injury   Pertinent negatives include no fever.    Past Medical History:  Diagnosis Date  . Hyperlipidemia   . Hypertension   . Seizures ([place])     There are no active problems to display for this patient.   History reviewed. No pertinent surgical history.  OB History    No data available       Home Medications    Prior to Admission medications   Medication Sig Start Date End Date Taking? Authorizing Provider  atorvastatin (LIPITOR) 10 MG tablet Take 10 mg by mouth daily.    Historical Provider, MD  HYDROcodone-acetaminophen (NORCO/VICODIN) 5-325 MG per tablet Take 1 tablet by mouth every 4 (four) hours as needed. 06/08/13   Lucila Maine, PA-C  Multiple Vitamins-Minerals (MULTIVITAMIN PO) Take 1 tablet by mouth daily.    Historical Provider, MD  phenytoin (DILANTIN) 100 MG ER capsule Take 100 mg by mouth 3 (three) times daily.    Historical Provider, MD  triamterene-hydrochlorothiazide (MAXZIDE-25) 37.5-25 MG per tablet Take 1 tablet by mouth daily.    Historical Provider, MD    Family History No family history on file.  Social History Social History  Substance Use Topics  . Smoking status: Never Smoker  .  Smokeless tobacco: Never Used  . Alcohol use No     Allergies   Review of patient's allergies indicates no known allergies.   Review of Systems Review of Systems  Constitutional: Negative for fever.  Respiratory: Negative for shortness of breath.   Cardiovascular: Negative for chest pain.  Gastrointestinal: Negative for vomiting.  Genitourinary: Negative for flank pain.  Musculoskeletal: Negative for back pain and neck pain.  Skin: Negative for wound.  Neurological: Negative for weakness, numbness and headaches.     Physical Exam Updated Vital Signs BP 179/98   Pulse 62   Temp 98.6 F (37 C) (Oral)   Resp 20   Ht 5\' 11"  (1.803 m)   Wt 74.4 kg   SpO2 99%   BMI 22.87 kg/m   Physical Exam  Constitutional: She appears well-developed and well-nourished. No distress.  HENT:  Head: Atraumatic.  Eyes: Conjunctivae are normal. No scleral icterus.  Neck: Neck supple. No tracheal deviation present.  Cardiovascular: Normal rate, regular rhythm, normal heart sounds and intact distal pulses.   Pulmonary/Chest: Effort normal. No respiratory distress. She exhibits no tenderness.  Abdominal: Normal appearance. She exhibits no distension.  Genitourinary:  Genitourinary Comments: No cva tenderness  Musculoskeletal:  Mild sts and tenderness left wrist. Radial pulse 2+. Skin is intact. Good rom at left shoulder and left elbow without pain. No other focal bony tenderness on bil ext exam. CTLS spine, non tender, aligned, no step off.  Neurological: She is alert.  Skin: Skin is warm and dry. No rash noted.  Psychiatric: She has a normal mood and affect.  Nursing note and vitals reviewed.    ED Treatments / Results  Labs (all labs ordered are listed, but only abnormal results are displayed) Labs Reviewed - No data to display  EKG  EKG Interpretation None       Radiology Dg Wrist Complete Left  Result Date: 11/21/2015 CLINICAL DATA:  Fall from porch last night with left  wrist pain and swelling. Initial encounter. EXAM: LEFT WRIST - COMPLETE 3+ VIEW COMPARISON:  None. FINDINGS: Transverse nondisplaced distal radial metaphysis fracture with mild radial sided buckling/ impaction. There is regional soft tissue swelling. Mild irregularity of the ulnar styloid without convincing fracture. Normal radiocarpal and distal radial ulnar alignment. IMPRESSION: Nondisplaced distal radial metaphysis fracture. Electronically Signed   By: Monte Fantasia M.D.   On: 11/21/2015 19:14   Dg Hand Complete Left  Result Date: 11/21/2015 CLINICAL DATA:  Left wrist pain and swelling after fall from porch last night. Initial encounter. EXAM: LEFT HAND - COMPLETE 3+ VIEW COMPARISON:  None. FINDINGS: Nondisplaced distal radial metaphysis fracture as described on dedicated wrist exam. No fracture or malalignment at the hand. 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 IMPRESSION: Nondisplaced distal radial metaphysis fracture. Electronically Signed   By: Monte Fantasia M.D.   On: 11/21/2015 19:12    Procedures Procedures (including critical care time)  Medications Ordered in ED Medications  HYDROcodone-acetaminophen (NORCO/VICODIN) 5-325 MG per tablet 1 tablet (1 tablet Oral Given 11/21/15 1955)     Initial Impression / Assessment and Plan / ED Course  I have reviewed the triage vital signs and the nursing notes.  Pertinent labs & imaging results that were available during my care of the patient were reviewed by me and considered in my medical decision making (see chart for details).  Clinical Course   Xrays.   Discussed xrays w pt.   Sugar tong splint by ortho tech.  Radial pulse 2+. No numbness.   Patient has ride, no meds pta. Hydrocodone po.  Patient comfortable, and appears stable for d/c.  Hx htn, took meds today, and has adequate at home.  No headache. No cp or sob.     Final Clinical Impressions(s) / ED Diagnoses   Final diagnoses:    None    New Prescriptions New Prescriptions   No medications on file     Lajean Saver, MD 11/21/15 2136

## 2015-11-21 NOTE — ED Triage Notes (Signed)
Patient complains of left hand/wrist pain after falling on step yesterday, swelling noted to hand, no obvious deformity. Positive distal pulse and ROM

## 2015-12-01 LAB — LAB REPORT - SCANNED

## 2016-01-06 ENCOUNTER — Ambulatory Visit (INDEPENDENT_AMBULATORY_CARE_PROVIDER_SITE_OTHER): Payer: Self-pay

## 2016-01-06 ENCOUNTER — Ambulatory Visit (INDEPENDENT_AMBULATORY_CARE_PROVIDER_SITE_OTHER): Payer: Medicare Other | Admitting: Physician Assistant

## 2016-01-06 ENCOUNTER — Ambulatory Visit (INDEPENDENT_AMBULATORY_CARE_PROVIDER_SITE_OTHER): Payer: Medicare Other

## 2016-01-06 ENCOUNTER — Encounter (INDEPENDENT_AMBULATORY_CARE_PROVIDER_SITE_OTHER): Payer: Self-pay | Admitting: Physician Assistant

## 2016-01-06 DIAGNOSIS — S52552D Other extraarticular fracture of lower end of left radius, subsequent encounter for closed fracture with routine healing: Secondary | ICD-10-CM | POA: Diagnosis not present

## 2016-01-06 DIAGNOSIS — S52552A Other extraarticular fracture of lower end of left radius, initial encounter for closed fracture: Secondary | ICD-10-CM

## 2016-01-06 NOTE — Progress Notes (Signed)
   Office Visit Note   Patient: Deborah Webb           Date of Birth: 1950/04/18           MRN: [id number] Visit Date: 01/06/2016              Requested by: Vincente Liberty, MD [address] [place], [place] [address] PCP: Leola Brazil, MD   Assessment & Plan: Visit Diagnoses:  1. Oth extrartic fracture of lower end of left radius, init     Plan: splint for comfort. No heavy lifting for another 2 weeks > 15 lbs.  Follow-Up Instructions: Return if symptoms worsen or fail to improve.   Orders:  Orders Placed This Encounter  Procedures  . XR Wrist Complete Left   No orders of the defined types were placed in this encounter.     Procedures: No procedures performed   Clinical Data: No additional findings.   Subjective: Chief Complaint  Patient presents with  . Left Wrist - Follow-up    Patient here today almost 7 weeks s/p left distal radius fracture. She is wearing velcro wrist splint. She states she is doing better, just still some tenderness.     Review of Systems  All other systems reviewed and are negative.    Objective: Vital Signs: There were no vitals taken for this visit.  Physical Exam  Constitutional: She appears well-developed and well-nourished.    Left Hand Exam   Tenderness  The patient is experiencing tenderness in the radial area.   Range of Motion   Wrist  Extension: normal  Flexion: normal   Other  Erythema: absent Pulse: present  Comments:  Lacks last 5 degrees with supination, has full pronation . Full motor and sensation of hand.       Specialty Comments:  No specialty comments available.  Imaging: Xr Wrist Complete Left  Result Date: 01/06/2016 Left wrist 3 views sow the fracture to be healing well with good consolidation.fracture remains non displaced. No other fractures seen.    PMFS History: There are no active problems to display for this patient.  Past Medical History:    Diagnosis Date  . Hyperlipidemia   . Hypertension   . Seizures ([place])     No family history on file.  No past surgical history on file. Social History   Occupational History  . Not on file.   Social History Main Topics  . Smoking status: Never Smoker  . Smokeless tobacco: Never Used  . Alcohol use No  . Drug use: No  . Sexual activity: Not on file

## 2016-05-15 DIAGNOSIS — M159 Polyosteoarthritis, unspecified: Secondary | ICD-10-CM | POA: Diagnosis not present

## 2016-05-15 DIAGNOSIS — I119 Hypertensive heart disease without heart failure: Secondary | ICD-10-CM | POA: Diagnosis not present

## 2016-05-15 DIAGNOSIS — E7800 Pure hypercholesterolemia, unspecified: Secondary | ICD-10-CM | POA: Diagnosis not present

## 2016-05-15 DIAGNOSIS — R350 Frequency of micturition: Secondary | ICD-10-CM | POA: Diagnosis not present

## 2016-05-15 DIAGNOSIS — R569 Unspecified convulsions: Secondary | ICD-10-CM | POA: Diagnosis not present

## 2016-05-15 DIAGNOSIS — G309 Alzheimer's disease, unspecified: Secondary | ICD-10-CM | POA: Diagnosis not present

## 2016-05-15 DIAGNOSIS — Z6822 Body mass index (BMI) 22.0-22.9, adult: Secondary | ICD-10-CM | POA: Diagnosis not present

## 2016-05-15 DIAGNOSIS — Z9181 History of falling: Secondary | ICD-10-CM | POA: Diagnosis not present

## 2016-05-15 DIAGNOSIS — M2550 Pain in unspecified joint: Secondary | ICD-10-CM | POA: Diagnosis not present

## 2016-06-21 ENCOUNTER — Other Ambulatory Visit: Payer: Self-pay | Admitting: Pulmonary Disease

## 2016-06-21 DIAGNOSIS — Z1231 Encounter for screening mammogram for malignant neoplasm of breast: Secondary | ICD-10-CM

## 2016-07-06 DIAGNOSIS — Z0001 Encounter for general adult medical examination with abnormal findings: Secondary | ICD-10-CM | POA: Diagnosis not present

## 2016-07-06 DIAGNOSIS — E7800 Pure hypercholesterolemia, unspecified: Secondary | ICD-10-CM | POA: Diagnosis not present

## 2016-07-06 DIAGNOSIS — Z6822 Body mass index (BMI) 22.0-22.9, adult: Secondary | ICD-10-CM | POA: Diagnosis not present

## 2016-07-06 DIAGNOSIS — M159 Polyosteoarthritis, unspecified: Secondary | ICD-10-CM | POA: Diagnosis not present

## 2016-07-06 DIAGNOSIS — R350 Frequency of micturition: Secondary | ICD-10-CM | POA: Diagnosis not present

## 2016-07-06 DIAGNOSIS — M2550 Pain in unspecified joint: Secondary | ICD-10-CM | POA: Diagnosis not present

## 2016-07-06 DIAGNOSIS — R569 Unspecified convulsions: Secondary | ICD-10-CM | POA: Diagnosis not present

## 2016-07-06 DIAGNOSIS — Z79899 Other long term (current) drug therapy: Secondary | ICD-10-CM | POA: Diagnosis not present

## 2016-07-06 DIAGNOSIS — G309 Alzheimer's disease, unspecified: Secondary | ICD-10-CM | POA: Diagnosis not present

## 2016-07-06 DIAGNOSIS — Z9181 History of falling: Secondary | ICD-10-CM | POA: Diagnosis not present

## 2016-07-06 DIAGNOSIS — E559 Vitamin D deficiency, unspecified: Secondary | ICD-10-CM | POA: Diagnosis not present

## 2016-07-06 DIAGNOSIS — I119 Hypertensive heart disease without heart failure: Secondary | ICD-10-CM | POA: Diagnosis not present

## 2016-07-12 ENCOUNTER — Ambulatory Visit
Admission: RE | Admit: 2016-07-12 | Discharge: 2016-07-12 | Disposition: A | Discharge status: Home / Self Care | Payer: PPO | Source: Ambulatory Visit | Attending: Pulmonary Disease | Admitting: Pulmonary Disease

## 2016-07-12 DIAGNOSIS — Z1231 Encounter for screening mammogram for malignant neoplasm of breast: Secondary | ICD-10-CM

## 2016-08-19 ENCOUNTER — Observation Stay (HOSPITAL_COMMUNITY)
Admission: EM | Admit: 2016-08-19 | Discharge: 2016-08-21 | Disposition: A | Discharge status: Home / Self Care | Payer: PPO | Attending: Internal Medicine | Admitting: Internal Medicine

## 2016-08-19 ENCOUNTER — Encounter (HOSPITAL_COMMUNITY): Payer: Self-pay | Admitting: *Deleted

## 2016-08-19 ENCOUNTER — Observation Stay (HOSPITAL_COMMUNITY): Payer: PPO

## 2016-08-19 DIAGNOSIS — R262 Difficulty in walking, not elsewhere classified: Secondary | ICD-10-CM | POA: Diagnosis not present

## 2016-08-19 DIAGNOSIS — I6529 Occlusion and stenosis of unspecified carotid artery: Secondary | ICD-10-CM | POA: Diagnosis not present

## 2016-08-19 DIAGNOSIS — G40909 Epilepsy, unspecified, not intractable, without status epilepticus: Secondary | ICD-10-CM | POA: Diagnosis not present

## 2016-08-19 DIAGNOSIS — Z7982 Long term (current) use of aspirin: Secondary | ICD-10-CM | POA: Insufficient documentation

## 2016-08-19 DIAGNOSIS — T420X1A Poisoning by hydantoin derivatives, accidental (unintentional), initial encounter: Secondary | ICD-10-CM | POA: Diagnosis not present

## 2016-08-19 DIAGNOSIS — R011 Cardiac murmur, unspecified: Secondary | ICD-10-CM | POA: Diagnosis not present

## 2016-08-19 DIAGNOSIS — F0390 Unspecified dementia without behavioral disturbance: Secondary | ICD-10-CM | POA: Insufficient documentation

## 2016-08-19 DIAGNOSIS — R531 Weakness: Secondary | ICD-10-CM | POA: Diagnosis not present

## 2016-08-19 DIAGNOSIS — T420X5A Adverse effect of hydantoin derivatives, initial encounter: Secondary | ICD-10-CM | POA: Diagnosis not present

## 2016-08-19 DIAGNOSIS — I10 Essential (primary) hypertension: Secondary | ICD-10-CM | POA: Diagnosis not present

## 2016-08-19 DIAGNOSIS — T540X1A Toxic effect of phenol and phenol homologues, accidental (unintentional), initial encounter: Secondary | ICD-10-CM | POA: Diagnosis not present

## 2016-08-19 DIAGNOSIS — R221 Localized swelling, mass and lump, neck: Secondary | ICD-10-CM

## 2016-08-19 DIAGNOSIS — E785 Hyperlipidemia, unspecified: Secondary | ICD-10-CM | POA: Insufficient documentation

## 2016-08-19 DIAGNOSIS — I712 Thoracic aortic aneurysm, without rupture: Secondary | ICD-10-CM | POA: Insufficient documentation

## 2016-08-19 DIAGNOSIS — I7121 Aneurysm of the ascending aorta, without rupture: Secondary | ICD-10-CM | POA: Diagnosis present

## 2016-08-19 DIAGNOSIS — E876 Hypokalemia: Secondary | ICD-10-CM | POA: Diagnosis not present

## 2016-08-19 DIAGNOSIS — I729 Aneurysm of unspecified site: Secondary | ICD-10-CM | POA: Diagnosis not present

## 2016-08-19 LAB — CBC
HCT: 41.2 % (ref 36.0–46.0)
HEMOGLOBIN: 13.9 g/dL (ref 12.0–15.0)
MCH: 30.9 pg (ref 26.0–34.0)
MCHC: 33.7 g/dL (ref 30.0–36.0)
MCV: 91.6 fL (ref 78.0–100.0)
PLATELETS: 197 10*3/uL (ref 150–400)
RBC: 4.5 MIL/uL (ref 3.87–5.11)
RDW: 13.3 % (ref 11.5–15.5)
WBC: 6.1 10*3/uL (ref 4.0–10.5)

## 2016-08-19 LAB — HEPATIC FUNCTION PANEL
ALK PHOS: 67 U/L (ref 38–126)
ALT: 18 U/L (ref 14–54)
AST: 19 U/L (ref 15–41)
Albumin: 3.8 g/dL (ref 3.5–5.0)
BILIRUBIN DIRECT: 0.1 mg/dL (ref 0.1–0.5)
BILIRUBIN INDIRECT: 0.5 mg/dL (ref 0.3–0.9)
BILIRUBIN TOTAL: 0.6 mg/dL (ref 0.3–1.2)
Total Protein: 6.7 g/dL (ref 6.5–8.1)

## 2016-08-19 LAB — URINALYSIS, ROUTINE W REFLEX MICROSCOPIC
Bilirubin Urine: NEGATIVE
GLUCOSE, UA: NEGATIVE mg/dL
KETONES UR: NEGATIVE mg/dL
LEUKOCYTES UA: NEGATIVE
Nitrite: NEGATIVE
PROTEIN: NEGATIVE mg/dL
Specific Gravity, Urine: 1.01 (ref 1.005–1.030)
pH: 7 (ref 5.0–8.0)

## 2016-08-19 LAB — BASIC METABOLIC PANEL
ANION GAP: 11 (ref 5–15)
BUN: 18 mg/dL (ref 6–20)
CALCIUM: 9.2 mg/dL (ref 8.9–10.3)
CHLORIDE: 95 mmol/L — AB (ref 101–111)
CO2: 29 mmol/L (ref 22–32)
CREATININE: 1.06 mg/dL — AB (ref 0.44–1.00)
GFR calc non Af Amer: 53 mL/min — ABNORMAL LOW (ref >60)
Glucose, Bld: 101 mg/dL — ABNORMAL HIGH (ref 65–99)
Potassium: 2.9 mmol/L — ABNORMAL LOW (ref 3.5–5.1)
SODIUM: 135 mmol/L (ref 135–145)

## 2016-08-19 LAB — PHENYTOIN LEVEL, TOTAL: Phenytoin Lvl: 34.9 ug/mL (ref 10.0–20.0)

## 2016-08-19 LAB — CBG MONITORING, ED: Glucose-Capillary: 121 mg/dL — ABNORMAL HIGH (ref 65–99)

## 2016-08-19 LAB — MAGNESIUM: Magnesium: 2.2 mg/dL (ref 1.7–2.4)

## 2016-08-19 MED ORDER — HYDROCODONE-ACETAMINOPHEN 5-325 MG PO TABS
1.0000 | ORAL_TABLET | ORAL | Status: DC | PRN
Start: 2016-08-19 — End: 2016-08-21

## 2016-08-19 MED ORDER — ONDANSETRON HCL 4 MG/2ML IJ SOLN: 4.0000 mg | Freq: Four times a day (QID) | INTRAMUSCULAR | Status: DC | PRN

## 2016-08-19 MED ORDER — POTASSIUM CHLORIDE CRYS ER 20 MEQ PO TBCR
40.0000 meq | EXTENDED_RELEASE_TABLET | Freq: Once | ORAL | Status: AC
  Administered 2016-08-19: 40 meq via ORAL
  Filled 2016-08-19: qty 2

## 2016-08-19 MED ORDER — ACETAMINOPHEN 650 MG RE SUPP
650.0000 mg | Freq: Four times a day (QID) | RECTAL | Status: DC | PRN
Start: 2016-08-19 — End: 2016-08-21

## 2016-08-19 MED ORDER — TRIAMTERENE-HCTZ 37.5-25 MG PO TABS
1.0000 | ORAL_TABLET | Freq: Once | ORAL | Status: AC
  Administered 2016-08-19: 1 via ORAL
  Filled 2016-08-19: qty 1

## 2016-08-19 MED ORDER — ACETAMINOPHEN 325 MG PO TABS: 650.0000 mg | ORAL_TABLET | Freq: Four times a day (QID) | ORAL | Status: DC | PRN

## 2016-08-19 MED ORDER — DONEPEZIL HCL 5 MG PO TABS
5.0000 mg | ORAL_TABLET | Freq: Once | ORAL | Status: AC
  Administered 2016-08-19: 5 mg via ORAL
  Filled 2016-08-19: qty 1

## 2016-08-19 MED ORDER — DONEPEZIL HCL 5 MG PO TABS: 5.0000 mg | ORAL_TABLET | Freq: Once | ORAL | Status: DC

## 2016-08-19 MED ORDER — LOSARTAN POTASSIUM 50 MG PO TABS
50.0000 mg | ORAL_TABLET | Freq: Once | ORAL | Status: AC
  Administered 2016-08-19: 50 mg via ORAL
  Filled 2016-08-19: qty 1

## 2016-08-19 MED ORDER — ASPIRIN EC 81 MG PO TBEC
81.0000 mg | DELAYED_RELEASE_TABLET | Freq: Every day | ORAL | Status: DC
  Administered 2016-08-20 – 2016-08-21 (×2): 81 mg via ORAL
  Filled 2016-08-19 (×2): qty 1

## 2016-08-19 MED ORDER — SODIUM CHLORIDE 0.9 % IV SOLN
INTRAVENOUS | Status: DC
Start: 2016-08-19 — End: 2016-08-20

## 2016-08-19 MED ORDER — ONDANSETRON HCL 4 MG PO TABS: 4.0000 mg | ORAL_TABLET | Freq: Four times a day (QID) | ORAL | Status: DC | PRN

## 2016-08-19 MED ORDER — DONEPEZIL HCL 5 MG PO TABS: 5.0000 mg | ORAL_TABLET | Freq: Every day | ORAL | Status: DC

## 2016-08-19 MED ORDER — TRIAMTERENE-HCTZ 37.5-25 MG PO TABS: 1.0000 | ORAL_TABLET | Freq: Every day | ORAL | Status: DC

## 2016-08-19 MED ORDER — LOSARTAN POTASSIUM 50 MG PO TABS: 50.0000 mg | ORAL_TABLET | Freq: Every day | ORAL | Status: DC

## 2016-08-19 MED ORDER — SODIUM CHLORIDE 0.9% FLUSH
3.0000 mL | Freq: Two times a day (BID) | INTRAVENOUS | Status: DC
  Administered 2016-08-20: 3 mL via INTRAVENOUS

## 2016-08-19 MED ORDER — IOPAMIDOL (ISOVUE-370) INJECTION 76%
INTRAVENOUS | Status: AC
  Administered 2016-08-19: 50 mL
  Filled 2016-08-19: qty 50

## 2016-08-19 MED ORDER — LOSARTAN POTASSIUM 50 MG PO TABS: 50.0000 mg | ORAL_TABLET | Freq: Once | ORAL | Status: DC

## 2016-08-19 MED ORDER — ATORVASTATIN CALCIUM 10 MG PO TABS
10.0000 mg | ORAL_TABLET | Freq: Every day | ORAL | Status: DC
  Administered 2016-08-20 – 2016-08-21 (×2): 10 mg via ORAL
  Filled 2016-08-19 (×2): qty 1

## 2016-08-19 NOTE — ED Provider Notes (Signed)
[facility] DEPT Provider Note   CSN: [id number] Arrival date & time: 08/19/16  1156     History   Chief Complaint Chief Complaint  Patient presents with  . Weakness    HPI Deborah Webb is a 66 y.o. female.  HPI  Patient is a 66 year old female with past medical history significant for HTN, HLD, seizure disorder on Dilantin, who presents to the emergency department for a one-week history of generalized weakness and fatigue. Denies recent falls or head trauma. Associated with intermittent episodes of lightheadedness with standing. No syncopal episodes. Associated with increased urinary urgency and frequency, denies dysuria. Denies fever, chills, headache, change in vision, slurring of speech, chest pain, shortness of breath, weakness or numbness, vertigo. Nothing improves or worsens her generalized weakness. Increased dose of Dilantin over the past couple of months from 3 times a day to 4 times a day. No other recent medication changes.  Past Medical History:  Diagnosis Date  . Hyperlipidemia   . Hypertension   . Seizures [facility])     Patient Active Problem List   Diagnosis Date Noted  . Dilantin toxicity 08/19/2016  . Hypokalemia 08/19/2016    Past Surgical History:  Procedure Laterality Date  . BREAST CYST ASPIRATION Right     OB History    No data available       Home Medications    Prior to Admission medications   Medication Sig Start Date End Date Taking? Authorizing Provider  aspirin EC 81 MG tablet Take 81 mg by mouth daily.   Yes [provider]  atorvastatin (LIPITOR) 10 MG tablet Take 10 mg by mouth every morning.    Yes [provider]  donepezil (ARICEPT) 5 MG tablet Take 5 mg by mouth at bedtime.   Yes [provider]  losartan (COZAAR) 50 MG tablet Take 50 mg by mouth daily.   Yes [provider]  Multiple Vitamins-Minerals (MULTIVITAMIN PO) Take 1 tablet by mouth every Monday.    Yes [provider]    phenytoin (DILANTIN) 100 MG ER capsule Take 100 mg by mouth as directed. Take 1 tablet 4 times daily on T, Th, and Sat. Take 1 tablet 3 times daily on M, W, and F.   Yes [provider]  triamterene-hydrochlorothiazide (MAXZIDE-25) 37.5-25 MG per tablet Take 1 tablet by mouth daily.   Yes [provider]  HYDROcodone-acetaminophen (NORCO/VICODIN) 5-325 MG tablet Take 1 tablet by mouth every 6 (six) hours as needed for moderate pain. Patient not taking: Reported on 01/06/2016 11/21/15   Lajean Saver, MD    Family History Family History  Problem Relation Age of Onset  . Breast cancer Mother     Social History Social History  Substance Use Topics  . Smoking status: Never Smoker  . Smokeless tobacco: Never Used  . Alcohol use No     Allergies   Patient has no known allergies.   Review of Systems Review of Systems  Constitutional: Positive for fatigue. Negative for appetite change and fever.  HENT: Negative for trouble swallowing.   Eyes: Negative for visual disturbance.  Respiratory: Negative for cough, chest tightness and shortness of breath.   Cardiovascular: Negative for chest pain.  Gastrointestinal: Negative for abdominal pain, blood in stool, diarrhea and vomiting.  Endocrine: Positive for polyuria.  Genitourinary: Positive for frequency and urgency. Negative for dysuria, flank pain and hematuria.  Musculoskeletal: Negative for back pain.  Skin: Negative for rash.  Neurological: Positive for weakness (  Generalized). Negative for dizziness, seizures, speech difficulty, light-headedness and headaches.  Psychiatric/Behavioral: Negative for confusion.     Physical Exam Updated Vital Signs BP (!) 188/101   Pulse (!) 59   Temp 98.1 F (36.7 C) (Oral)   Resp 16   SpO2 95%   Physical Exam  Constitutional: She is oriented to person, place, and time. She appears well-developed and well-nourished. No distress.  HENT:  Head: Atraumatic.  Mouth/Throat:  Oropharynx is clear and moist.  Eyes: Conjunctivae and EOM are normal. Pupils are equal, round, and reactive to light.  Neck: Normal range of motion. Neck supple. No JVD present.  Cardiovascular: Normal rate, regular rhythm, normal heart sounds and intact distal pulses.   No murmur heard. Pulmonary/Chest: Effort normal and breath sounds normal. No respiratory distress.  Abdominal: She exhibits no distension and no mass. There is no tenderness. There is no guarding.  Musculoskeletal: Normal range of motion. She exhibits no edema.  Neurological: She is alert and oriented to person, place, and time.  Skin: Skin is warm. Capillary refill takes less than 2 seconds. No pallor.  Psychiatric: She has a normal mood and affect.     ED Treatments / Results  Labs (all labs ordered are listed, but only abnormal results are displayed) Labs Reviewed  BASIC METABOLIC PANEL - Abnormal; Notable for the following:       Result Value   Potassium 2.9 (*)    Chloride 95 (*)    Glucose, Bld 101 (*)    Creatinine, Ser 1.06 (*)    GFR calc non Af Amer 53 (*)    All other components within normal limits  URINALYSIS, ROUTINE W REFLEX MICROSCOPIC - Abnormal; Notable for the following:    Color, Urine STRAW (*)    Hgb urine dipstick SMALL (*)    Bacteria, UA RARE (*)    Squamous Epithelial / LPF 0-5 (*)    All other components within normal limits  PHENYTOIN LEVEL, TOTAL - Abnormal; Notable for the following:    Phenytoin Lvl 34.9 (*)    All other components within normal limits  CBG MONITORING, ED - Abnormal; Notable for the following:    Glucose-Capillary 121 (*)    All other components within normal limits  CBC  HEPATIC FUNCTION PANEL  MAGNESIUM    EKG  EKG Interpretation None       Radiology No results found.  Procedures Procedures (including critical care time)  Medications Ordered in ED Medications  potassium chloride SA (K-DUR,KLOR-CON) CR tablet 40 mEq (not administered)    donepezil (ARICEPT) tablet 5 mg (not administered)  losartan (COZAAR) tablet 50 mg (not administered)  triamterene-hydrochlorothiazide (MAXZIDE-25) 37.5-25 MG per tablet 1 tablet (not administered)     Initial Impression / Assessment and Plan / ED Course  I have reviewed the triage vital signs and the nursing notes.  Pertinent labs & imaging results that were available during my care of the patient were reviewed by me and considered in my medical decision making (see chart for details).    Patient is a 66 year old female with past medical history significant for seizure disorder on Dilantin, who presents to the emergency department with a one-week history of generalized weakness. No longer able to angulate secondary to weakness. Arrival to distress, not ill appearing. Afebrile. Nonfocal neurologic exam.  Differential includes Dilantin toxicity, infectious process, electrolyte abnormality.  Lab work remarkable for no leukocytosis. Potassium 2.9, likely secondary to HCTZ use. Creatinine at baseline. Hemoglobin at baseline. Glucose 101.  UA shows no signs of infectious process. EKG showed normal sinus rhythm, normal intervals, no signs of acute ischemia, no chamber enlargement. Dilantin level 35.  Weakness most likely secondary to Dilantin toxicity. Patient admitted for further management. Patient stable for the floor. We'll hold Dilantin.    Final Clinical Impressions(s) / ED Diagnoses   Final diagnoses:  Accidental phenytoin poisoning, initial encounter    New Prescriptions New Prescriptions   No medications on file     Nathaniel Man, MD 08/19/16 Jeananne Rama    Pattricia Boss, MD 08/20/16 [phone]

## 2016-08-19 NOTE — ED Notes (Signed)
Speaking with husband, she has only taken Lipitor and Dilantin today.

## 2016-08-19 NOTE — ED Notes (Signed)
Patient to CT.

## 2016-08-19 NOTE — ED Notes (Signed)
Lab called with critical lab: Dilantin 34.9.  Nathaniel Man, MD made aware.

## 2016-08-19 NOTE — ED Notes (Signed)
Spoke to Pharmacy, Aricept and Cozaar given already today.

## 2016-08-19 NOTE — ED Triage Notes (Signed)
Pt reports generalized weakness and not feeling well x 1 week. Denies n/v or any specific pain. No acute distress is noted at triage. No neuro deficits noted.

## 2016-08-19 NOTE — H&P (Signed)
Deborah Webb KPT:[id number] DOB: December 26, 1950 DOA: 08/19/2016     PCP: Vincente Liberty, MD   Outpatient Specialists: none Patient coming from:  home Lives   With family   Chief Complaint: Unable to walk  HPI: Deborah Webb is a 66 y.o. female with medical history significant of seizure disorder, HLD, HTN, dementia    Presented with generalized feeling weak and unable to ambulate for past one week having no nausea and vomiting or any specific discomfort. Family reports decreased PO intake. She is a picky eater, poor appetite. He has not been following no head trauma she's been having occasional episodes of lightheadedness when she stands up but has not syncopized. No fevers or chills no headache no chest pain or shortness of breath denies a neurological is complains patient reports that her Dilantin has been titrated up from 3-4 times a day her primary care provider. Family thinks it was last adjusted in May when she was told to go up from 3 to 4 tablets a day. Patient have been taking her own medication and is uncertain how many times she took I. Husband and paietn both worry it is possible she took it too many times She has not had a seizure for the past 2 years.    Regarding pertinent Chronic problems: hx of dementia on Aricept.  No hx of DM or CAD  IN ER:  Temp (24hrs), Avg:98.1 F (36.7 C), Min:98.1 F (36.7 C), Max:98.1 F (36.7 C)      on arrival  ED Triage Vitals [08/19/16 1204]  Enc Vitals Group     BP (!) 160/113     Pulse Rate 71     Resp 18     Temp 98.1 F (36.7 C)     Temp Source Oral     SpO2 100 %     Weight      Height      Head Circumference      Peak Flow      Pain Score      Pain Loc      Pain Edu?      Excl. in [place]?    sat 99% HR 61 BP 190/99 Na 135 K 2.9 Cr 1.06 WBC 6.1 Hg 13.9  Dilantin 34.9  Following Medications were ordered in ER: Medications  potassium chloride SA (K-DUR,KLOR-CON) CR tablet 40 mEq (not administered)      Hospitalist was called for admission for dilantin Toxicity  Review of Systems:    Pertinent positives include: fatigue,   Constitutional:  No weight loss, night sweats, Fevers, chills, weight loss  HEENT:  No headaches, Difficulty swallowing,Tooth/dental problems,Sore throat,  No sneezing, itching, ear ache, nasal congestion, post nasal drip,  Cardio-vascular:  No chest pain, Orthopnea, PND, anasarca, dizziness, palpitations.no Bilateral lower extremity swelling  GI:  No heartburn, indigestion, abdominal pain, nausea, vomiting, diarrhea, change in bowel habits, loss of appetite, melena, blood in stool, hematemesis Resp:  no shortness of breath at rest. No dyspnea on exertion, No excess mucus, no productive cough, No non-productive cough, No coughing up of blood.No change in color of mucus.No wheezing. Skin:  no rash or lesions. No jaundice GU:  no dysuria, change in color of urine, no urgency or frequency. No straining to urinate.  No flank pain.  Musculoskeletal:  No joint pain or no joint swelling. No decreased range of motion. No back pain.  Psych:  No change in mood or affect. No depression or anxiety. No memory  loss.  Neuro: no localizing neurological complaints, no tingling, no weakness, no double vision, no gait abnormality, no slurred speech, no confusion  As per HPI otherwise 10 point review of systems negative.   Past Medical History: Past Medical History:  Diagnosis Date  . Hyperlipidemia   . Hypertension   . Seizures ([place])    Past Surgical History:  Procedure Laterality Date  . BREAST CYST ASPIRATION Right      Social History:  Ambulatory   independently      reports that she has never smoked. She has never used smokeless tobacco. She reports that she does not drink alcohol or use drugs.  Allergies:  No Known Allergies     Family History:  Family History  Problem Relation Age of Onset  . Breast cancer Mother     Medications: Prior to  Admission medications   Medication Sig Start Date End Date Taking? Authorizing Provider  aspirin EC 81 MG tablet Take 81 mg by mouth daily.   Yes [provider]  atorvastatin (LIPITOR) 10 MG tablet Take 10 mg by mouth every morning.    Yes [provider]  donepezil (ARICEPT) 5 MG tablet Take 5 mg by mouth at bedtime.   Yes [provider]  losartan (COZAAR) 50 MG tablet Take 50 mg by mouth daily.   Yes [provider]  Multiple Vitamins-Minerals (MULTIVITAMIN PO) Take 1 tablet by mouth every Monday.    Yes [provider]  phenytoin (DILANTIN) 100 MG ER capsule Take 100 mg by mouth as directed. Take 1 tablet 4 times daily on T, Th, and Sat. Take 1 tablet 3 times daily on M, W, and F.   Yes [provider]  triamterene-hydrochlorothiazide (MAXZIDE-25) 37.5-25 MG per tablet Take 1 tablet by mouth daily.   Yes [provider]  HYDROcodone-acetaminophen (NORCO/VICODIN) 5-325 MG tablet Take 1 tablet by mouth every 6 (six) hours as needed for moderate pain. Patient not taking: Reported on 01/06/2016 11/21/15   Lajean Saver, MD    Physical Exam: Patient Vitals for the past 24 hrs:  BP Temp Temp src Pulse Resp SpO2  08/19/16 1745 (!) 190/99 - - 61 - 99 %  08/19/16 1715 (!) 177/98 - - (!) 59 - 96 %  08/19/16 1645 (!) 185/96 - - (!) 57 - 100 %  08/19/16 1619 (!) 181/96 - - (!) 58 16 98 %  08/19/16 1600 (!) 145/90 - - (!) 59 - 100 %  08/19/16 1204 (!) 160/113 98.1 F (36.7 C) Oral 71 18 100 %    1. General:  in No Acute distress 2. Psychological: Alert and Oriented 3. Head/ENT:    Dry Mucous Membranes                          Head Non traumatic, neck supple                            Poor Dentition                          Pulsating mass of the neck on the right measuring 2 cm 4. SKIN:   decreased Skin turgor,  Skin clean Dry and intact no rash 5. Heart: Regular rate and rhythm systolic Murmur, Rub or gallop 6. Lungs:  no wheezes  or crackles   7. Abdomen: Soft, non-tender, Non distended 8.  Lower extremities: no clubbing, cyanosis, or edema 9. Neurologically Grossly intact, moving all 4 extremities equally  10. MSK: Normal range of motion   body mass index is unknown because there is no height or weight on file.  Labs on Admission:   Labs on Admission: I have personally reviewed following labs and imaging studies  CBC:  Recent Labs Lab 08/19/16 1205  WBC 6.1  HGB 13.9  HCT 41.2  MCV 91.6  PLT 191   Basic Metabolic Panel:  Recent Labs Lab 08/19/16 1205  NA 135  K 2.9*  CL 95*  CO2 29  GLUCOSE 101*  BUN 18  CREATININE 1.06*  CALCIUM 9.2   GFR: CrCl cannot be calculated (Unknown ideal weight.). Liver Function Tests: No results for input(s): AST, ALT, ALKPHOS, BILITOT, PROT, ALBUMIN in the last 168 hours. No results for input(s): LIPASE, AMYLASE in the last 168 hours. No results for input(s): AMMONIA in the last 168 hours. Coagulation Profile: No results for input(s): INR, PROTIME in the last 168 hours. Cardiac Enzymes: No results for input(s): CKTOTAL, CKMB, CKMBINDEX, TROPONINI in the last 168 hours. BNP (last 3 results) No results for input(s): PROBNP in the last 8760 hours. HbA1C: No results for input(s): HGBA1C in the last 72 hours. CBG:  Recent Labs Lab 08/19/16 1214  GLUCAP 121*   Lipid Profile: No results for input(s): CHOL, HDL, LDLCALC, TRIG, CHOLHDL, LDLDIRECT in the last 72 hours. Thyroid Function Tests: No results for input(s): TSH, T4TOTAL, FREET4, T3FREE, THYROIDAB in the last 72 hours. Anemia Panel: No results for input(s): VITAMINB12, FOLATE, FERRITIN, TIBC, IRON, RETICCTPCT in the last 72 hours. Urine analysis:    Component Value Date/Time   COLORURINE STRAW (A) 08/19/2016 1615   APPEARANCEUR CLEAR 08/19/2016 1615   LABSPEC 1.010 08/19/2016 1615   PHURINE 7.0 08/19/2016 1615   GLUCOSEU NEGATIVE 08/19/2016 1615   HGBUR SMALL (A) 08/19/2016 1615    BILIRUBINUR NEGATIVE 08/19/2016 1615   KETONESUR NEGATIVE 08/19/2016 1615   PROTEINUR NEGATIVE 08/19/2016 1615   NITRITE NEGATIVE 08/19/2016 1615   LEUKOCYTESUR NEGATIVE 08/19/2016 1615   Sepsis Labs: @LABRCNTIP (procalcitonin:4,lacticidven:4) )No results found for this or any previous visit (from the past 240 hour(s)).     UA no evidence of UTI     No results found for: HGBA1C  CrCl cannot be calculated (Unknown ideal weight.).  BNP (last 3 results) No results for input(s): PROBNP in the last 8760 hours.   ECG REPORT  Independently reviewed Rate: 72  Rhythm:  ST&T Change: No acute ischemic changes   QTC 446 QRS 84  There were no vitals filed for this visit.   Cultures: No results found for: [place], [place] [address], [place], REPTSTATUS   Radiological Exams on Admission: No results found.  Chart has been reviewed    Assessment/Plan  66 y.o. female with medical history significant of seizure disorder, HLD, HTN  Admitted for suspected dilantin toxicity incidentally found to have pulsating right neck mass    Present on Admission: . Dilantin toxicity - discussed with neurology per their recomendations  hold dilantin dose per pharmacy repeat level in AM   . Dementia - expect some degree of sundowning while hospitalized Cardiac murmur - obtain cho Neck pulsating mass - obtain CTA of the neck to evaluate for aneurysm or thrombosis.  Hypokalemia - replace and check magnesium level  Other plan as per orders.  DVT prophylaxis:  SCD   Code Status:  FULL CODE   as per patient    Family Communication:  Family at  Bedside  plan of care was discussed with   Husband,    Disposition Plan:   To home once workup is complete and patient is stable                      Would benefit from PT/OT eval prior to DC  ordered                       Nutrition  consulted                          Consults called: none    Admission status:    obs   Level of care    tele          I  have spent a total of 66 min on this admission  extra time was spent to discuss case with consultants (neurology)  [place] 08/19/2016, 8:43 PM    [facility]  Pager [phone]   after 2 AM please page floor coverage PA If 7AM-7PM, please contact the day team taking care of the patient  [email or web address]  Password [email or web address]

## 2016-08-20 ENCOUNTER — Observation Stay (HOSPITAL_BASED_OUTPATIENT_CLINIC_OR_DEPARTMENT_OTHER): Payer: PPO

## 2016-08-20 DIAGNOSIS — I360 Nonrheumatic tricuspid (valve) stenosis: Secondary | ICD-10-CM

## 2016-08-20 DIAGNOSIS — T420X1A Poisoning by hydantoin derivatives, accidental (unintentional), initial encounter: Secondary | ICD-10-CM

## 2016-08-20 LAB — ECHOCARDIOGRAM COMPLETE
AOASC: 34 cm
CHL CUP DOP CALC LVOT VTI: 18.7 cm
CHL CUP MV DEC (S): 215
E/e' ratio: 4.3
EWDT: 215 ms
FS: 23 % — AB (ref 28–44)
IV/PV OW: 1.08
LA ID, A-P, ES: 35 mm
LA vol index: 18 mL/m2
LADIAMINDEX: 1.77 cm/m2
LAVOL: 35.7 mL
LAVOLA4C: 29.4 mL
LEFT ATRIUM END SYS DIAM: 35 mm
LV E/e' medial: 4.3
LV E/e'average: 4.3
LV PW d: 12 mm — AB (ref 0.6–1.1)
LV e' LATERAL: 9.14 cm/s
LVOT SV: 65 mL
LVOT area: 3.46 cm2
LVOT peak grad rest: 5 mmHg
LVOT peak vel: 117 cm/s
LVOTD: 21 mm
MV pk E vel: 39.3 m/s
MVPKAVEL: 67.4 m/s
RV LATERAL S' VELOCITY: 10.9 cm/s
RV sys press: 26 mmHg
Reg peak vel: 242 cm/s
TAPSE: 14.2 mm
TDI e' lateral: 9.14
TDI e' medial: 4.03
TRMAXVEL: 242 cm/s
Weight: 2753.6 oz

## 2016-08-20 LAB — COMPREHENSIVE METABOLIC PANEL
ALT: 20 U/L (ref 14–54)
AST: 21 U/L (ref 15–41)
Albumin: 3.8 g/dL (ref 3.5–5.0)
Alkaline Phosphatase: 65 U/L (ref 38–126)
Anion gap: 8 (ref 5–15)
BUN: 12 mg/dL (ref 6–20)
CHLORIDE: 98 mmol/L — AB (ref 101–111)
CO2: 32 mmol/L (ref 22–32)
CREATININE: 0.95 mg/dL (ref 0.44–1.00)
Calcium: 8.8 mg/dL — ABNORMAL LOW (ref 8.9–10.3)
GFR calc Af Amer: >60 mL/min (ref >60)
Glucose, Bld: 106 mg/dL — ABNORMAL HIGH (ref 65–99)
Potassium: 2.8 mmol/L — ABNORMAL LOW (ref 3.5–5.1)
Sodium: 138 mmol/L (ref 135–145)
Total Bilirubin: 0.7 mg/dL (ref 0.3–1.2)
Total Protein: 6.6 g/dL (ref 6.5–8.1)

## 2016-08-20 LAB — CBC
HCT: 39.5 % (ref 36.0–46.0)
Hemoglobin: 13.4 g/dL (ref 12.0–15.0)
MCH: 30.9 pg (ref 26.0–34.0)
MCHC: 33.9 g/dL (ref 30.0–36.0)
MCV: 91.2 fL (ref 78.0–100.0)
PLATELETS: 186 10*3/uL (ref 150–400)
RBC: 4.33 MIL/uL (ref 3.87–5.11)
RDW: 13.2 % (ref 11.5–15.5)
WBC: 6.9 10*3/uL (ref 4.0–10.5)

## 2016-08-20 LAB — TSH: TSH: 3.616 u[IU]/mL (ref 0.350–4.500)

## 2016-08-20 LAB — MAGNESIUM: Magnesium: 2.2 mg/dL (ref 1.7–2.4)

## 2016-08-20 LAB — PREALBUMIN: Prealbumin: 22.2 mg/dL (ref 18–38)

## 2016-08-20 LAB — PHENYTOIN LEVEL, TOTAL: Phenytoin Lvl: 31.3 ug/mL (ref 10.0–20.0)

## 2016-08-20 LAB — PHOSPHORUS: Phosphorus: 3.9 mg/dL (ref 2.5–4.6)

## 2016-08-20 MED ORDER — POTASSIUM CHLORIDE CRYS ER 20 MEQ PO TBCR
40.0000 meq | EXTENDED_RELEASE_TABLET | ORAL | Status: AC
  Administered 2016-08-20 (×2): 40 meq via ORAL

## 2016-08-20 NOTE — Progress Notes (Signed)
MEDICATION RELATED CONSULT NOTE - INITIAL   Pharmacy Consult for Dilantin Indication: seizures  No Known Allergies  Patient Measurements: Weight: 172 lb 1.6 oz (78.1 kg)  Vital Signs: Temp: 98 F (36.7 C) (06/10 0623) Temp Source: Oral (06/10 0623) BP: 180/99 (06/10 0623) Pulse Rate: 65 (06/10 0623) Intake/Output from previous day: No intake/output data recorded. Intake/Output from this shift: No intake/output data recorded.  Labs:  Recent Labs  08/19/16 1205 08/19/16 1918 08/20/16 0327  WBC 6.1  --  6.9  HGB 13.9  --  13.4  HCT 41.2  --  39.5  PLT 197  --  186  CREATININE 1.06*  --  0.95  MG  --  2.2 2.2  PHOS  --   --  3.9  ALBUMIN  --  3.8 3.8  PROT  --  6.7 6.6  AST  --  19 21  ALT  --  18 20  ALKPHOS  --  67 65  BILITOT  --  0.6 0.7  BILIDIR  --  0.1  --   IBILI  --  0.5  --    CrCl cannot be calculated (Unknown ideal weight.).   Microbiology: No results found for this or any previous visit (from the past 720 hour(s)).  Medical History: Past Medical History:  Diagnosis Date  . Hyperlipidemia   . Hypertension   . Seizures ([place])     Medications:  Prescriptions Prior to Admission  Medication Sig Dispense Refill Last Dose  . aspirin EC 81 MG tablet Take 81 mg by mouth daily.   08/18/2016 at Unknown time  . atorvastatin (LIPITOR) 10 MG tablet Take 10 mg by mouth every morning.    08/19/2016 at Unknown time  . donepezil (ARICEPT) 5 MG tablet Take 5 mg by mouth at bedtime.   08/18/2016 at Unknown time  . losartan (COZAAR) 50 MG tablet Take 50 mg by mouth daily.   08/18/2016 at Unknown time  . Multiple Vitamins-Minerals (MULTIVITAMIN PO) Take 1 tablet by mouth every Monday.    08/14/2016  . phenytoin (DILANTIN) 100 MG ER capsule Take 100 mg by mouth as directed. Take 1 tablet 4 times daily on T, Th, and Sat. Take 1 tablet 3 times daily on M, W, and F.   08/19/2016 at Unknown time  . triamterene-hydrochlorothiazide (MAXZIDE-25) 37.5-25 MG per tablet Take 1 tablet by  mouth daily.   08/18/2016 at Unknown time  . HYDROcodone-acetaminophen (NORCO/VICODIN) 5-325 MG tablet Take 1 tablet by mouth every 6 (six) hours as needed for moderate pain. (Patient not taking: Reported on 01/06/2016) 20 tablet 0 Not Taking    Assessment: 66 y.o. female admitted with weakness, found to have elevated phenytoin level.  Adjusted total phenytoin level 40.5  Patient on a very complicated Dilantin regimen, taking 1 tab TID on MWF,  1 tab QID on TTSat, and none on Sundays, per medication history.   This morning's phenytoin level was 31.3 (supratherapeutic) and albumin was 3.8 (no need for level adjustment).   Goal of Therapy:  Total phenytoin level 10-20  Plan:  F/U AM Dilantin level and CMet  Once level back within therapeutic range, will resume Dilantin 300 mg once daily to avoid confusion and for ease of administration, as Dilantin doses up to 400 mg can be given as a single daily dose.  Suggest rechecking level in 5-7 days.  If total phenytoin level low (when adjusted for albumin level) and a dose increase warranted, recommend increasing dose by no more than  50 mg/day due to phenytoin's nonlinear kinetics.  Belia Heman, PharmD PGY1 Pharmacy Resident [phone] (Pager) 08/20/2016 7:56 AM

## 2016-08-20 NOTE — Progress Notes (Signed)
[facility] Progress Note  Patient: Deborah Webb TDV:[id number]   PCP: Vincente Liberty, MD DOB: 07/07/1950   DOA: 08/19/2016   DOS: 08/20/2016   Date of Service: the patient was seen and examined on 08/20/2016  Subjective: Continues to have generalized weakness. No nausea no vomiting. No dizziness reported. Slow to respond. No chest pain and abdominal pain. Tolerating oral diet.  Brief hospital course: Pt. with PMH of seizure, HLD, HTN, dementia; admitted on 08/19/2016, presented with complaint of difficulty walking, was found to have Dilantin toxicity. Currently further plan is close monitoring.  Assessment and Plan: 1. Phenantoin toxicity. Patient presented with difficulty walking as well as dizziness and lightheadedness. Also appears to have more confused on the baseline. Further workup showed that she has a significantly elevated phenytoin level. Level consistent with presentation with ataxia. Currently going down. No evidence of suicidal ideation or intention drug overdose. Recently 1 month ago her dose was increased as her levels were low. Last seizure was reported 2 years ago. Plan is to monitor her Dilantin level and initiate at a single daily dose once stable.  2. History of seizure. Patient has been taking Dilantin for many years. Patient reports that she has never been tried on any other seizure medication. Recommend neurologist to consider other medication to avoid potential overdose situation like this. Last seizure was reported 2 years ago, may not need adjustment of Dilantin level if remains asymptomatic.  3. History of dementia. Continue to monitor.  4. Positive neck mass. Superficial carotid artery no evidence of aneurysm or thrombosis.  5. Severe hypokalemia. Potassium 2.8. Likely contributing to patient's generalized weakness. No evidence of GI loss. We will replace and recheck.  6. Cardiac murmur. Echo performed. Preserved EF, no acute  abnormalities. No further workup.  Diet: Cardiac diet DVT Prophylaxis: subcutaneous Heparin  Advance goals of care discussion: Full code  Family Communication: family was present at bedside, at the time of interview. The pt provided permission to discuss medical plan with the family. Opportunity was given to ask question and all questions were answered satisfactorily.   Disposition:  Discharge to home with home health.  Consultants: none Procedures: none  Antibiotics: Anti-infectives    None       Objective: Physical Exam: Vitals:   08/20/16 0108 08/20/16 0623 08/20/16 0900 08/20/16 1500  BP: (!) 156/83 (!) 180/99 (!) 177/99 (!) 190/94  Pulse: (!) 58 65 68 70  Resp: 16 16 18 18   Temp: 97.5 F (36.4 C) 98 F (36.7 C) 98.4 F (36.9 C) 98.6 F (37 C)  TempSrc: Oral Oral Oral   SpO2: 98% 98% 98% 100%  Weight:        Intake/Output Summary (Last 24 hours) at 08/20/16 1801 Last data filed at 08/20/16 1736  Gross per 24 hour  Intake              240 ml  Output                0 ml  Net              240 ml   Filed Weights   08/19/16 2153  Weight: 78.1 kg (172 lb 1.6 oz)   General: Alert, Awake and Oriented to Time, Place and Person. Appear in mild distress, affect appropriate Eyes: PERRL, Conjunctiva normal ENT: Oral Mucosa clear moist. Neck: no JVD, no Abnormal Mass Or lumps Cardiovascular: S1 and S2 Present, no Murmur, Peripheral Pulses Present Respiratory: normal respiratory effort, Bilateral Air  entry equal and Decreased, no use of accessory muscle, Clear to Auscultation, no Crackles, no wheezes Abdomen: Bowel Sound present, Soft and no tenderness, no hernia Skin: no redness, no Rash, no induration Extremities: no Pedal edema, no calf tenderness Neurologic: Grossly no focal neuro deficit. Bilaterally Equal motor strength  Data Reviewed: CBC:  Recent Labs Lab 08/19/16 1205 08/20/16 0327  WBC 6.1 6.9  HGB 13.9 13.4  HCT 41.2 39.5  MCV 91.6 91.2  PLT 197  694   Basic Metabolic Panel:  Recent Labs Lab 08/19/16 1205 08/19/16 1918 08/20/16 0327  NA 135  --  138  K 2.9*  --  2.8*  CL 95*  --  98*  CO2 29  --  32  GLUCOSE 101*  --  106*  BUN 18  --  12  CREATININE 1.06*  --  0.95  CALCIUM 9.2  --  8.8*  MG  --  2.2 2.2  PHOS  --   --  3.9    Liver Function Tests:  Recent Labs Lab 08/19/16 1918 08/20/16 0327  AST 19 21  ALT 18 20  ALKPHOS 67 65  BILITOT 0.6 0.7  PROT 6.7 6.6  ALBUMIN 3.8 3.8   No results for input(s): LIPASE, AMYLASE in the last 168 hours. No results for input(s): AMMONIA in the last 168 hours. Coagulation Profile: No results for input(s): INR, PROTIME in the last 168 hours. Cardiac Enzymes: No results for input(s): CKTOTAL, CKMB, CKMBINDEX, TROPONINI in the last 168 hours. BNP (last 3 results) No results for input(s): PROBNP in the last 8760 hours. CBG:  Recent Labs Lab 08/19/16 1214  GLUCAP 121*   Studies: Ct Angio Neck W Or Wo Contrast  Result Date: 08/19/2016 CLINICAL DATA:  Initial evaluation for generalized weakness with fatigue, inability to walk. Pulsatile right neck mass. EXAM: CT ANGIOGRAPHY NECK TECHNIQUE: Multidetector CT imaging of the neck was performed using the standard protocol during bolus administration of intravenous contrast. Multiplanar CT image reconstructions and MIPs were obtained to evaluate the vascular anatomy. Carotid stenosis measurements (when applicable) are obtained utilizing NASCET criteria, using the distal internal carotid diameter as the denominator. CONTRAST:  50 cc of Isovue 370. COMPARISON:  None available. FINDINGS: Aortic arch: Mild aneurysmal dilatation of the visualized ascending aorta up to 4 cm in diameter. Mild plaque at the undersurface of the aortic arch. Visualized aorta otherwise unremarkable. No significant atheromatous plaque or flow-limiting stenosis about the origin of the great vessels. Visualized subclavian artery use are widely patent. Right carotid  system: Proximal right common carotid artery is tortuous, coursing superficially with a sharp bend, positioned approximately 7 mm deep to the skin (series 7, image 165). Query this as source of the pulsatile neck mass. Right common carotid artery widely patent distally to the bifurcation. No significant atheromatous plaque about the right bifurcation. Right ICA widely patent distally to the skullbase without stenosis, dissection, aneurysm, or other abnormality. Right external carotid artery and its branches within normal limits. Left carotid system: Left common carotid artery widely patent from its origin to the bifurcation without flow-limiting stenosis. Mild eccentric calcified plaque about the proximal left ICA without stenosis. Left ICA widely patent distally to the skullbase without stenosis, dissection, aneurysm, or other vascular abnormality. Left external carotid artery and its branches within normal limits. Vertebral arteries: Both of the vertebral arteries arise from the subclavian arteries. Right vertebral artery slightly dominant. Vertebral arteries widely patent within the neck without stenosis, dissection, or occlusion. Smooth atheromatous plaque noted  within the left vertebral artery as it crosses the dural margin with relatively mild narrowing. Atheromatous irregularity noted within the left V4 segment distally. Skeleton: No acute osseus abnormality. Mild height loss at the T4 vertebral body appears chronic in nature. No worrisome lytic or blastic osseous lesions. Moderate multilevel degenerative spondylolysis noted within cervical spine, greatest at C4-5 and C5-6. Other neck: No acute soft tissue abnormality within the neck. No adenopathy. Salivary glands are normal. 12 mm hypodense left thyroid nodule noted, of doubtful significance. Thyroid otherwise unremarkable. Upper chest: Visualized upper chest within normal limits. Partially visualized lungs are clear. IMPRESSION: 1. No aneurysm or other  vascular abnormality identified within the neck. The proximal right common carotid artery is tortuous, coursing superficially to lie 7 mm deep to the skin at the anterior aspect of the lower right neck. Query this as palpable abnormality of concern. 2. No hemodynamically significant or critical stenosis within the neck. 3. Mild diffuse vascular tortuosity, suggestive of underlying hypertension. 4. Aneurysmal dilatation of the ascending aorta up to 4 cm in maximal diameter. Recommend annual imaging followup by CTA or MRA. This recommendation follows 2010 ACCF/AHA/AATS/ACR/ASA/SCA/SCAI/SIR/STS/SVM Guidelines for the Diagnosis and Management of Patients with Thoracic Aortic Disease. Circulation. 2010; 121: Y637-C588 Electronically Signed   By: Jeannine Boga M.D.   On: 08/19/2016 22:04    Scheduled Meds: . aspirin EC  81 mg Oral Daily  . atorvastatin  10 mg Oral Daily  . sodium chloride flush  3 mL Intravenous Q12H   Continuous Infusions: PRN Meds: acetaminophen **OR** acetaminophen, HYDROcodone-acetaminophen, ondansetron **OR** ondansetron (ZOFRAN) IV  Time spent: 35 minutes  Author: Berle Mull, MD [facility] Pager: [phone] 08/20/2016 6:01 PM  If 7PM-7AM, please contact night-coverage at [email or web address], password [facility]

## 2016-08-20 NOTE — Progress Notes (Signed)
  Echocardiogram 2D Echocardiogram has been performed.  Deborah Webb 08/20/2016, 2:53 PM

## 2016-08-20 NOTE — Progress Notes (Signed)
MEDICATION RELATED CONSULT NOTE - INITIAL   Pharmacy Consult for Dilantin Indication: seizures  No Known Allergies  Patient Measurements: Weight: 172 lb 1.6 oz (78.1 kg)  Vital Signs: Temp: 97.5 F (36.4 C) (06/10 0108) Temp Source: Oral (06/10 0108) BP: 156/83 (06/10 0108) Pulse Rate: 58 (06/10 0108) Intake/Output from previous day: No intake/output data recorded. Intake/Output from this shift: No intake/output data recorded.  Labs:  Recent Labs  08/19/16 1205 08/19/16 1918  WBC 6.1  --   HGB 13.9  --   HCT 41.2  --   PLT 197  --   CREATININE 1.06*  --   MG  --  2.2  ALBUMIN  --  3.8  PROT  --  6.7  AST  --  19  ALT  --  18  ALKPHOS  --  67  BILITOT  --  0.6  BILIDIR  --  0.1  IBILI  --  0.5   CrCl cannot be calculated (Unknown ideal weight.).   Microbiology: No results found for this or any previous visit (from the past 720 hour(s)).  Medical History: Past Medical History:  Diagnosis Date  . Hyperlipidemia   . Hypertension   . Seizures ([place])     Medications:  Prescriptions Prior to Admission  Medication Sig Dispense Refill Last Dose  . aspirin EC 81 MG tablet Take 81 mg by mouth daily.   08/18/2016 at Unknown time  . atorvastatin (LIPITOR) 10 MG tablet Take 10 mg by mouth every morning.    08/19/2016 at Unknown time  . donepezil (ARICEPT) 5 MG tablet Take 5 mg by mouth at bedtime.   08/18/2016 at Unknown time  . losartan (COZAAR) 50 MG tablet Take 50 mg by mouth daily.   08/18/2016 at Unknown time  . Multiple Vitamins-Minerals (MULTIVITAMIN PO) Take 1 tablet by mouth every Monday.    08/14/2016  . phenytoin (DILANTIN) 100 MG ER capsule Take 100 mg by mouth as directed. Take 1 tablet 4 times daily on T, Th, and Sat. Take 1 tablet 3 times daily on M, W, and F.   08/19/2016 at Unknown time  . triamterene-hydrochlorothiazide (MAXZIDE-25) 37.5-25 MG per tablet Take 1 tablet by mouth daily.   08/18/2016 at Unknown time  . HYDROcodone-acetaminophen (NORCO/VICODIN) 5-325  MG tablet Take 1 tablet by mouth every 6 (six) hours as needed for moderate pain. (Patient not taking: Reported on 01/06/2016) 20 tablet 0 Not Taking    Assessment: 66 y.o. female admitted with weakness, found to have elevated phenytoin level.  Adjusted total phenytoin level 40.5  Patient on a very complicated Dilantin regimen, taking 1 tab TID on MWF,  1 tab QID on TTSat, and none on Sundays, per medication history.   Goal of Therapy:  Total phenytoin level 10-20  Plan:  F/U AM Dilantin level and CMet  Once level back within therapeutic range, will resume Dilantin 300 mg once daily to avoid confusion and for ease of administration, as Dilantin doses up to 400 mg can be given as a single daily dose.  Suggest rechecking level in 5-7 days.  If total phenytoin level low (when adjusted for albumin level) and a dose increase warranted, recommend increasing dose by no more than 50 mg/day due to phenytoin's nonlinear kinetics.   Deborah Webb, [place] 08/20/2016,1:12 AM

## 2016-08-20 NOTE — Progress Notes (Signed)
OT Cancellation Note  Patient Details Name: Deborah Webb MRN: [id number] DOB: 1951/03/11   Cancelled Treatment:    Reason Eval/Treat Not Completed: Patient at procedure or test/ unavailable. Pt off unit for testing on OT arrival. Will check back as able for OT evaluation. Thank you for this referral!  Deborah Herrlich, MS OTR/L  Pager: Deborah Webb 08/20/2016, 2:36 PM

## 2016-08-20 NOTE — Evaluation (Signed)
Physical Therapy Evaluation Patient Details Name: Deborah Webb MRN: [id number] DOB: 11-20-1950 Today's Date: 08/20/2016   History of Present Illness  Deborah Webb is a 66 y.o. female with medical history significant of seizure disorder, HLD, HTN, dementia.   Patient was admitted 08/19/16 with fatigue, weakness, and inability to walk.  Patient with Dilantin toxicity.  Clinical Impression  Patient presents with problems listed below.  Will benefit from acute PT to maximize functional mobility prior to discharge home with husband.  Patient with general weakness and decreased balance impacting gait.  Recommend HHPT for f/u at d/c.  Recommend patient use RW for balance/safety at home.    Follow Up Recommendations Home health PT;Supervision/Assistance - 24 hour    Equipment Recommendations  Rolling walker with 5" wheels;3in1 (PT)    Recommendations for Other Services       Precautions / Restrictions Precautions Precautions: Fall Restrictions Weight Bearing Restrictions: No      Mobility  Bed Mobility Overal bed mobility: Modified Independent             General bed mobility comments: Increased time required.  Transfers Overall transfer level: Needs assistance Equipment used: Rolling walker (2 wheeled) Transfers: Sit to/from Stand Sit to Stand: Min guard         General transfer comment: Verbal cues for hand placement.  Assist for safety.  Ambulation/Gait Ambulation/Gait assistance: Min guard Ambulation Distance (Feet): 84 Feet Assistive device: Rolling walker (2 wheeled) Gait Pattern/deviations: Step-to pattern;Decreased step length - right;Decreased stride length;Drifts right/left Gait velocity: decreased Gait velocity interpretation: Below normal speed for age/gender General Gait Details: Verbal cues for safe use of RW.  Patient uses step-to pattern with short step with RLE.  Attempted to have patient take longer step with Rt LE - unable to change.   Patient reports she has walked "like that" since she fractured her ankle and had surgery years ago.  Patient standing too far from RW - cues to step into RW.  Stairs            Wheelchair Mobility    Modified Rankin (Stroke Patients Only)       Balance Overall balance assessment: Needs assistance Sitting-balance support: No upper extremity supported;Feet supported Sitting balance-Leahy Scale: Good     Standing balance support: No upper extremity supported Standing balance-Leahy Scale: Fair Standing balance comment: Patient able to maintain static standing balance.  Requires UE support during dynamic activities.                             Pertinent Vitals/Pain Pain Assessment: No/denies pain    Home Living Family/patient expects to be discharged to:: Private residence Living Arrangements: Spouse/significant other Available Help at Discharge: Family;Available 24 hours/day Type of Home: House Home Access: Stairs to enter Entrance Stairs-Rails: Psychiatric nurse of Steps: 3 Home Layout: One level Home Equipment: None      Prior Function Level of Independence: Independent         Comments: Does not drive     Hand Dominance        Extremity/Trunk Assessment   Upper Extremity Assessment Upper Extremity Assessment: Overall WFL for tasks assessed    Lower Extremity Assessment Lower Extremity Assessment: Generalized weakness       Communication   Communication: No difficulties  Cognition Arousal/Alertness: Awake/alert Behavior During Therapy: Restless;Anxious Overall Cognitive Status: History of cognitive impairments - at baseline  General Comments: Patient very talkative - tangential.  Easily distracted from task.  Difficulty providing orientation information, requiring increased time.  Required assist from husband to provide Home Living information.      General Comments       Exercises     Assessment/Plan    PT Assessment Patient needs continued PT services  PT Problem List Decreased strength;Decreased balance;Decreased mobility;Decreased coordination;Decreased cognition;Decreased knowledge of use of DME;Decreased safety awareness       PT Treatment Interventions DME instruction;Gait training;Stair training;Functional mobility training;Therapeutic activities;Therapeutic exercise;Balance training;Cognitive remediation;Patient/family education    PT Goals (Current goals can be found in the Care Plan section)  Acute Rehab PT Goals Patient Stated Goal: To go home PT Goal Formulation: With patient/family Time For Goal Achievement: 08/27/16 Potential to Achieve Goals: Good    Frequency Min 3X/week   Barriers to discharge        Co-evaluation               AM-PAC PT "6 Clicks" Daily Activity  Outcome Measure Difficulty turning over in bed (including adjusting bedclothes, sheets and blankets)?: None Difficulty moving from lying on back to sitting on the side of the bed? : A Little Difficulty sitting down on and standing up from a chair with arms (e.g., wheelchair, bedside commode, etc,.)?: None Help needed moving to and from a bed to chair (including a wheelchair)?: A Little Help needed walking in hospital room?: A Little Help needed climbing 3-5 steps with a railing? : A Lot 6 Click Score: 19    End of Session Equipment Utilized During Treatment: Gait belt Activity Tolerance: Patient tolerated treatment well Patient left: in chair;with call bell/phone within reach;with family/visitor present;with chair alarm set (Chair alarm pad, but no box. Will talk with RN) Nurse Communication: Mobility status (Chair alarm needs box.) PT Visit Diagnosis: Unsteadiness on feet (R26.81);Other abnormalities of gait and mobility (R26.89);Muscle weakness (generalized) (M62.81)    Time: 5300-5110 PT Time Calculation (min) (ACUTE ONLY): 37 min   Charges:    PT Evaluation $PT Eval Moderate Complexity: 1 Procedure PT Treatments $Gait Training: 8-22 mins   PT G Codes:   PT G-Codes **NOT FOR INPATIENT CLASS** Functional Assessment Tool Used: AM-PAC 6 Clicks Basic Mobility Functional Limitation: Mobility: Walking and moving around Mobility: Walking and Moving Around Current Status (Y1117): At least 20 percent but less than 40 percent impaired, limited or restricted Mobility: Walking and Moving Around Goal Status [id number]): At least 1 percent but less than 20 percent impaired, limited or restricted    Carita Pian. Sanjuana Kava, [facility] Acute Rehab Services Pager [place] 08/20/2016, 6:49 PM

## 2016-08-21 DIAGNOSIS — T420X1A Poisoning by hydantoin derivatives, accidental (unintentional), initial encounter: Secondary | ICD-10-CM | POA: Diagnosis not present

## 2016-08-21 LAB — BASIC METABOLIC PANEL
Anion gap: 10 (ref 5–15)
BUN: 20 mg/dL (ref 6–20)
CALCIUM: 8.5 mg/dL — AB (ref 8.9–10.3)
CO2: 26 mmol/L (ref 22–32)
CREATININE: 1.15 mg/dL — AB (ref 0.44–1.00)
Chloride: 98 mmol/L — ABNORMAL LOW (ref 101–111)
GFR, EST AFRICAN AMERICAN: 56 mL/min — AB (ref >60)
GFR, EST NON AFRICAN AMERICAN: 48 mL/min — AB (ref >60)
Glucose, Bld: 100 mg/dL — ABNORMAL HIGH (ref 65–99)
Potassium: 4.1 mmol/L (ref 3.5–5.1)
SODIUM: 134 mmol/L — AB (ref 135–145)

## 2016-08-21 LAB — HEMOGLOBIN A1C
HEMOGLOBIN A1C: 5.4 % (ref 4.8–5.6)
MEAN PLASMA GLUCOSE: 108 mg/dL

## 2016-08-21 LAB — COMPREHENSIVE METABOLIC PANEL
ALT: 18 U/L (ref 14–54)
ANION GAP: 9 (ref 5–15)
AST: 17 U/L (ref 15–41)
Albumin: 3.4 g/dL — ABNORMAL LOW (ref 3.5–5.0)
Alkaline Phosphatase: 62 U/L (ref 38–126)
BUN: 21 mg/dL — ABNORMAL HIGH (ref 6–20)
CHLORIDE: 98 mmol/L — AB (ref 101–111)
CO2: 30 mmol/L (ref 22–32)
Calcium: 8.5 mg/dL — ABNORMAL LOW (ref 8.9–10.3)
Creatinine, Ser: 1.01 mg/dL — ABNORMAL HIGH (ref 0.44–1.00)
GFR, EST NON AFRICAN AMERICAN: 57 mL/min — AB (ref >60)
Glucose, Bld: 96 mg/dL (ref 65–99)
POTASSIUM: 2.7 mmol/L — AB (ref 3.5–5.1)
SODIUM: 137 mmol/L (ref 135–145)
Total Bilirubin: 0.4 mg/dL (ref 0.3–1.2)
Total Protein: 6.1 g/dL — ABNORMAL LOW (ref 6.5–8.1)

## 2016-08-21 LAB — PHENYTOIN LEVEL, TOTAL: PHENYTOIN LVL: 26.5 ug/mL — AB (ref 10.0–20.0)

## 2016-08-21 MED ORDER — POTASSIUM CHLORIDE CRYS ER 20 MEQ PO TBCR
40.0000 meq | EXTENDED_RELEASE_TABLET | Freq: Once | ORAL | Status: AC
  Administered 2016-08-21: 40 meq via ORAL
  Filled 2016-08-21: qty 2

## 2016-08-21 MED ORDER — POTASSIUM CHLORIDE CRYS ER 20 MEQ PO TBCR
40.0000 meq | EXTENDED_RELEASE_TABLET | ORAL | Status: AC
  Administered 2016-08-21 (×2): 40 meq via ORAL
  Filled 2016-08-21: qty 2

## 2016-08-21 MED ORDER — POTASSIUM PHOSPHATES 15 MMOLE/5ML IV SOLN
30.0000 meq | Freq: Once | INTRAVENOUS | Status: AC
  Administered 2016-08-21: 30 meq via INTRAVENOUS
  Filled 2016-08-21: qty 6.82

## 2016-08-21 MED ORDER — PHENYTOIN SODIUM EXTENDED 300 MG PO CAPS: 300.0000 mg | ORAL_CAPSULE | Freq: Every day | ORAL | 0 refills | Status: DC

## 2016-08-21 NOTE — Progress Notes (Signed)
OT Cancellation Note  Patient Details Name: Deborah Webb MRN: [id number] DOB: Sep 19, 1950   Cancelled Treatment:    Reason Eval/Treat Not Completed: Medical issues which prohibited therapy. Low potassium. K+ repletion began. Pt currently eating. Will return later.   [place], OT/L  [phone] 08/21/2016 08/21/2016, 8:59 AM

## 2016-08-21 NOTE — Progress Notes (Signed)
Nutrition Brief Note  RD consulted to assess nutritional status/ needs.   Wt Readings from Last 15 Encounters:  08/20/16 172 lb 1.6 oz (78.1 kg)  11/21/15 164 lb (74.4 kg)  06/07/13 164 lb (74.4 kg)   66 y.o. female with medical history significant of seizure disorder, HLD, HTN  Admitted for suspected dilantin toxicity incidentally found to have pulsating right neck mass  Spoke with pt and husband, who report that pt's appetite has returned. Pt generally consumes 2-3 meals per day, however, often requires encouragement to eat. Per pt husband, pt is very task oriented and often forgets to eat when undertaking other tasks such as household chores. He estimates pt has lost about 10# over the past 6 months, but this is not consistent with wt hx.   Pt husband's primary concern today is ensuring pt will eat due to intermittent pain from teeth related to dental caries. Pt was consuming lunch tray of vegetable blend, mashed potatoes, and pot roast without difficulty. Pt reports it is painful if she consumes hard textured, crispy foods. Discussed with pt husband and pt to consume cooked vegetables as well as tender meats (chopped or mined as needed) with extra sauces and gravies.   Nutrition-Focused physical exam completed. Findings are no fat depletion, no muscle depletion, and no edema.   Body mass index is 24 kg/m. Patient meets criteria for normal weight range based on current BMI.   Current diet order is Heart Healthy, patient is consuming approximately 100% of meals at this time. Labs and medications reviewed.   No nutrition interventions warranted at this time. If nutrition issues arise, please consult RD.   Analucia Hush A. Jimmye Norman, RD, LDN, CDE Pager: [phone] After hours Pager: [phone]

## 2016-08-21 NOTE — Progress Notes (Signed)
Physical Therapy Treatment Patient Details Name: Deborah Webb MRN: [id number] DOB: 1950-07-14 Today's Date: 08/21/2016    History of Present Illness Deborah Webb is a 66 y.o. female with medical history significant of seizure disorder, HLD, HTN, dementia.   Patient was admitted 08/19/16 with fatigue, weakness, and inability to walk.  Patient with Dilantin toxicity.    PT Comments    Pt remains to have to cognitive and functional impairments. Spouse present for stair negotiation and ambulation due to poor carryover from patient with sequencing. Pt functioning at min guard/minA level. Pt remains at increased falls risk and con't to demo impaired sequencing, memory, and delayed processing. Acute PT to con't to follow.    Follow Up Recommendations  Home health PT;Supervision/Assistance - 24 hour     Equipment Recommendations  Rolling walker with 5" wheels;3in1 (PT)    Recommendations for Other Services       Precautions / Restrictions Precautions Precautions: Fall Precaution Comments: delayed processing Restrictions Weight Bearing Restrictions: No    Mobility  Bed Mobility               General bed mobility comments: pt up in chair  Transfers Overall transfer level: Needs assistance Equipment used: Rolling walker (2 wheeled) Transfers: Sit to/from [facility] Sit to Stand: Min assist Stand pivot transfers: Min assist       General transfer comment: pt completed std pvt transfer with minA to/from [facility]  Ambulation/Gait Ambulation/Gait assistance: Min assist Ambulation Distance (Feet): 120 Feet Assistive device: Rolling walker (2 wheeled) Gait Pattern/deviations: Step-through pattern;Decreased stride length;Scissoring;Decreased stance time - left Gait velocity: dec Gait velocity interpretation: Below normal speed for age/gender General Gait Details: tactile cues at R hip to increase step length, tactile cues at walker to maintain forward  momentum to improve fluidity of gait pattern. mild ataxia   Stairs Stairs: Yes   Stair Management: One rail Right (and left HHA) Number of Stairs: 2 (x2 trials) General stair comments: max directional v/c's for safe sequencing. "up with the good, down with the bad"  pt with poor caryover. spouse present and good recall  Wheelchair Mobility    Modified Rankin (Stroke Patients Only)       Balance           Standing balance support: No upper extremity supported Standing balance-Leahy Scale: Fair Standing balance comment: pt able to complete pericare in standing without LOB                            Cognition Arousal/Alertness: Awake/alert Behavior During Therapy: WFL for tasks assessed/performed Overall Cognitive Status: Impaired/Different from baseline Area of Impairment: Attention;Memory;Following commands;Safety/judgement;Awareness;Problem solving                   Current Attention Level: Sustained Memory: Decreased short-term memory (husband suspects onset of dementia) Following Commands: Follows one step commands with increased time Safety/Judgement: Decreased awareness of safety;Decreased awareness of deficits Awareness: Emergent Problem Solving: Slow processing;Difficulty sequencing;Requires verbal cues;Requires tactile cues General Comments: requires max verbal cues for sequencing transfers, walker, and stairs      Exercises      General Comments        Pertinent Vitals/Pain Pain Assessment: No/denies pain    Home Living                      Prior Function  PT Goals (current goals can now be found in the care plan section) Acute Rehab PT Goals Patient Stated Goal: go home Progress towards PT goals: Progressing toward goals    Frequency    Min 3X/week      PT Plan Current plan remains appropriate    Co-evaluation              AM-PAC PT "6 Clicks" Daily Activity  Outcome Measure  Difficulty  turning over in bed (including adjusting bedclothes, sheets and blankets)?: None Difficulty moving from lying on back to sitting on the side of the bed? : A Little Difficulty sitting down on and standing up from a chair with arms (e.g., wheelchair, bedside commode, etc,.)?: A Little Help needed moving to and from a bed to chair (including a wheelchair)?: A Little Help needed walking in hospital room?: A Little Help needed climbing 3-5 steps with a railing? : A Lot 6 Click Score: 18    End of Session Equipment Utilized During Treatment: Gait belt Activity Tolerance: Patient tolerated treatment well Patient left: in chair;with call bell/phone within reach;with family/visitor present Nurse Communication: Mobility status PT Visit Diagnosis: Unsteadiness on feet (R26.81);Other abnormalities of gait and mobility (R26.89);Muscle weakness (generalized) (M62.81)     Time: 1610-9604 PT Time Calculation (min) (ACUTE ONLY): 18 min  Charges:  $Gait Training: 8-22 mins                    G Codes:      Kittie Plater, PT, DPT Pager #: [phone] Office #: [phone]    [place] 08/21/2016, 4:14 PM

## 2016-08-21 NOTE — Significant Event (Signed)
CRITICAL VALUE ALERT  Critical Value:  2.7 potassium  Date & Time Notied:  08/21/16 0510  Provider Notified: T. Opyd MD [phone]  Orders Received/Actions taken: administer po potassium and administer iv piggyback

## 2016-08-21 NOTE — Progress Notes (Signed)
Occupational Therapy Evaluation Patient Details Name: DEVINA BEZOLD MRN: [id number] DOB: [birth year] Today's Date: 08/21/2016    History of Present Illness Othel CHARLIEE KRENZ is a 66 y.o. female with medical history significant of seizure disorder, HLD, HTN, dementia.   Patient was admitted 08/19/16 with fatigue, weakness, and inability to walk.  Patient with Dilantin toxicity.   Clinical Impression   PTA, pt independent with mobility and ADL without an assistive device. Pt currently requires min A with mobility and ADL and feel pt will benefit form HHOT to return to PLOF. Husband present at end of session and will be able to provide 24/7 S. Will follow acutely to address established goals and facilitate safe DC home.    Follow Up Recommendations  Home health OT;Supervision/Assistance - 24 hour    Equipment Recommendations  3 in 1 bedside commode;Other (comment) (RW)    Recommendations for Other Services       Precautions / Restrictions Precautions Precautions: Fall Restrictions Weight Bearing Restrictions: No      Mobility Bed Mobility Overal bed mobility: Modified Independent             General bed mobility comments: Increased time required.  Transfers Overall transfer level: Needs assistance Equipment used: Rolling walker (2 wheeled) Transfers: Sit to/from Stand Sit to Stand: Min guard         General transfer comment: Verbal cues for hand placement.  Assist for safety.    Balance Overall balance assessment: Needs assistance Sitting-balance support: No upper extremity supported;Feet supported Sitting balance-Leahy Scale: Good     Standing balance support: No upper extremity supported Standing balance-Leahy Scale: Fair Standing balance comment: Patient able to maintain static standing balance.  Requires UE support during ADL                           ADL either performed or assessed with clinical judgement   ADL Overall ADL's : Needs  assistance/impaired Eating/Feeding: Set up   Grooming: Set up;Standing   Upper Body Bathing: Set up;Standing   Lower Body Bathing: Set up;Supervison/ safety;Sit to/from stand   Upper Body Dressing : Set up;Sitting   Lower Body Dressing: Set up;Supervision/safety;Sit to/from stand   Toilet Transfer: Minimal Therapist, art Details (indicate cue type and reason): uncontrolled descent Toileting- Clothing Manipulation and Hygiene: Set up;Supervision/safety;Sit to/from stand       Functional mobility during ADLs: Min guard;Cueing for safety;Rolling walker (uncontrolled descent) General ADL Comments: Will benefit from 3 in 1; discussed home safety and equipment recommendations with husband     Vision Baseline Vision/History: Wears glasses       Perception     Praxis      Pertinent Vitals/Pain Pain Assessment: No/denies pain     Hand Dominance Right   Extremity/Trunk Assessment Upper Extremity Assessment Upper Extremity Assessment: Overall WFL for tasks assessed   Lower Extremity Assessment Lower Extremity Assessment: Defer to PT evaluation   Cervical / Trunk Assessment Cervical / Trunk Assessment: Normal   Communication Communication Communication: No difficulties   Cognition Arousal/Alertness: Awake/alert Behavior During Therapy: Restless;Anxious Overall Cognitive Status: History of cognitive impairments - at baseline                                     General Comments       Exercises     Shoulder Instructions  Home Living Family/patient expects to be discharged to:: Private residence Living Arrangements: Spouse/significant other Available Help at Discharge: Family;Available 24 hours/day Type of Home: House Home Access: Stairs to enter [employer] of Steps: 3 Entrance Stairs-Rails: Right;Left Home Layout: One level     Bathroom Shower/Tub: Corporate investment banker:  Standard Bathroom Accessibility: Yes How Accessible: Accessible via walker Home Equipment: None          Prior Functioning/Environment Level of Independence: Independent        Comments: Does not drive        OT Problem List: Decreased strength;Decreased activity tolerance;Impaired balance (sitting and/or standing);Decreased cognition;Decreased safety awareness;Decreased knowledge of use of DME or AE      OT Treatment/Interventions: Self-care/ADL training;Therapeutic exercise;DME and/or AE instruction;Therapeutic activities;Cognitive remediation/compensation;Patient/family education;Balance training    OT Goals(Current goals can be found in the care plan section) Acute Rehab OT Goals Patient Stated Goal: To go home OT Goal Formulation: With patient Time For Goal Achievement: 09/04/16 Potential to Achieve Goals: Good ADL Goals Pt Will Perform Lower Body Bathing: with modified independence;sit to/from stand Pt Will Perform Lower Body Dressing: with modified independence;sit to/from stand Pt Will Transfer to Toilet: with modified independence;ambulating;regular height toilet Pt Will Perform Toileting - Clothing Manipulation and hygiene: with modified independence;sit to/from stand Pt Will Perform Tub/Shower Transfer: Tub transfer;with supervision;ambulating;3 in 1;with caregiver independent in assisting  OT Frequency: Min 2X/week   Barriers to D/C:            Co-evaluation              AM-PAC PT "6 Clicks" Daily Activity     Outcome Measure Help from another person eating meals?: A Little Help from another person taking care of personal grooming?: None Help from another person toileting, which includes using toliet, bedpan, or urinal?: A Little Help from another person bathing (including washing, rinsing, drying)?: A Little Help from another person to put on and taking off regular upper body clothing?: None Help from another person to put on and taking off regular  lower body clothing?: A Little 6 Click Score: 20   End of Session Equipment Utilized During Treatment: Gait belt;Rolling walker Nurse Communication: Mobility status  Activity Tolerance: Patient tolerated treatment well Patient left: in chair;with call bell/phone within reach;with family/visitor present  OT Visit Diagnosis: Unsteadiness on feet (R26.81);Muscle weakness (generalized) (M62.81);Other symptoms and signs involving cognitive function                Time: 0935-1009 OT Time Calculation (min): 34 min Charges:  OT General Charges $OT Visit: 1 Procedure OT Evaluation $OT Eval Moderate Complexity: 1 Procedure OT Treatments $Self Care/Home Management : 8-22 mins G-Codes: OT G-codes **NOT FOR INPATIENT CLASS** Functional Assessment Tool Used: Clinical judgement Functional Limitation: Self care Self Care Current Status (W2956): At least 1 percent but less than 20 percent impaired, limited or restricted Self Care Goal Status (O1308): At least 1 percent but less than 20 percent impaired, limited or restricted   [facility], OT/L  [phone] 08/21/2016  Tyress Loden,[place] 08/21/2016, 10:19 AM

## 2016-08-21 NOTE — Care Management Note (Signed)
Case Management Note  Patient Details  Name: Deborah Webb MRN: [id number] Date of Birth: April 22, 1950  Subjective/Objective:   Pt admitted with Dilantin toxicity. She is from home with her spouse.                  Action/Plan: Plan is for patient to d/c home with orders for [facility] services. CM met with the patient and her husband and provided a list of [place] agencies. She selected [facility]. Santiago Glad with [facility] notified and accepted the referral. Pt has PCP that sometimes does not sign HH orders. CM called Dr Kilpatrick's office and he is willing to sign HH orders for the patient. Santiago Glad updated.  Pt with orders for rolling walker and 3 in 1. Pt does not want the 3 in 1. Santiago Glad with [facility] will have the walker delivered to the patients room. Patients husband to provide transportation home.   Expected Discharge Date:                  Expected Discharge Plan:  [place]  In-House Referral:     Discharge planning Services  CM Consult  Post Acute Care Choice:  Durable Medical Equipment, Home Health Choice offered to:  Patient, Spouse  DME Arranged:  3-N-1, Walker rolling (pt refused 3 in 1) DME Agency:  [place]:  RN, PT, OT [facility] Agency:  [place]  Status of Service:  Completed, signed off  If discussed at [place] of Stay Meetings, dates discussed:    Additional Comments:  Pollie Friar, RN 08/21/2016, 1:23 PM

## 2016-08-22 DIAGNOSIS — R269 Unspecified abnormalities of gait and mobility: Secondary | ICD-10-CM | POA: Diagnosis not present

## 2016-08-23 ENCOUNTER — Other Ambulatory Visit (HOSPITAL_COMMUNITY)
Admission: AD | Admit: 2016-08-23 | Discharge: 2016-08-23 | Disposition: A | Discharge status: Home / Self Care | Payer: PPO | Source: Skilled Nursing Facility | Attending: Pulmonary Disease | Admitting: Pulmonary Disease

## 2016-08-23 DIAGNOSIS — G40909 Epilepsy, unspecified, not intractable, without status epilepticus: Secondary | ICD-10-CM | POA: Insufficient documentation

## 2016-08-23 DIAGNOSIS — E785 Hyperlipidemia, unspecified: Secondary | ICD-10-CM | POA: Diagnosis not present

## 2016-08-23 DIAGNOSIS — T420X1D Poisoning by hydantoin derivatives, accidental (unintentional), subsequent encounter: Secondary | ICD-10-CM | POA: Diagnosis not present

## 2016-08-23 DIAGNOSIS — R531 Weakness: Secondary | ICD-10-CM | POA: Diagnosis not present

## 2016-08-23 DIAGNOSIS — F0390 Unspecified dementia without behavioral disturbance: Secondary | ICD-10-CM | POA: Diagnosis not present

## 2016-08-23 DIAGNOSIS — I10 Essential (primary) hypertension: Secondary | ICD-10-CM | POA: Diagnosis not present

## 2016-08-23 DIAGNOSIS — Z7982 Long term (current) use of aspirin: Secondary | ICD-10-CM | POA: Diagnosis not present

## 2016-08-23 LAB — PHENYTOIN LEVEL, TOTAL: Phenytoin Lvl: 14.3 ug/mL (ref 10.0–20.0)

## 2016-08-25 ENCOUNTER — Encounter (HOSPITAL_COMMUNITY): Payer: Self-pay

## 2016-08-25 ENCOUNTER — Emergency Department (HOSPITAL_COMMUNITY)
Admission: EM | Admit: 2016-08-25 | Discharge: 2016-08-25 | Disposition: A | Discharge status: Home / Self Care | Payer: PPO | Attending: Emergency Medicine | Admitting: Emergency Medicine

## 2016-08-25 DIAGNOSIS — Z79899 Other long term (current) drug therapy: Secondary | ICD-10-CM | POA: Diagnosis not present

## 2016-08-25 DIAGNOSIS — I10 Essential (primary) hypertension: Secondary | ICD-10-CM | POA: Diagnosis not present

## 2016-08-25 DIAGNOSIS — E876 Hypokalemia: Secondary | ICD-10-CM | POA: Insufficient documentation

## 2016-08-25 DIAGNOSIS — Z7982 Long term (current) use of aspirin: Secondary | ICD-10-CM | POA: Diagnosis not present

## 2016-08-25 LAB — I-STAT CHEM 8, ED
BUN: 25 mg/dL — ABNORMAL HIGH (ref 6–20)
CREATININE: 0.9 mg/dL (ref 0.44–1.00)
Calcium, Ion: 1.03 mmol/L — ABNORMAL LOW (ref 1.15–1.40)
Chloride: 101 mmol/L (ref 101–111)
Glucose, Bld: 87 mg/dL (ref 65–99)
HEMATOCRIT: 35 % — AB (ref 36.0–46.0)
HEMOGLOBIN: 11.9 g/dL — AB (ref 12.0–15.0)
Potassium: 3 mmol/L — ABNORMAL LOW (ref 3.5–5.1)
SODIUM: 137 mmol/L (ref 135–145)
TCO2: 26 mmol/L (ref 0–100)

## 2016-08-25 LAB — I-STAT BETA HCG BLOOD, ED (MC, WL, AP ONLY)

## 2016-08-25 MED ORDER — POTASSIUM CHLORIDE CRYS ER 20 MEQ PO TBCR
40.0000 meq | EXTENDED_RELEASE_TABLET | Freq: Once | ORAL | Status: AC
  Administered 2016-08-25: 40 meq via ORAL
  Filled 2016-08-25: qty 2

## 2016-08-25 NOTE — Discharge Instructions (Signed)
Take your medications as prescribed. Keep your scheduled appointment with Dr. Katherine Roan next week. Your blood potassium today was slightly low at 3.0. You're given potassium while here. Your blood pressure was high at 188/86. It should be rechecked at Dr. Oralia Rud office next week

## 2016-08-25 NOTE — ED Triage Notes (Signed)
Pt reports her home health nurse told her to come to the ER today due to high blood pressure. Pts BP was 155/100 and 152/102 at home. Pt states she feels fine, denies any pain. A&OX4, speaking clear complete sentences. BP at triage 174/101

## 2016-08-25 NOTE — ED Provider Notes (Signed)
[facility] DEPT Provider Note   CSN: [id number] Arrival date & time: 08/25/16  1239    Level V caveat dementia History   Chief Complaint Chief Complaint  Patient presents with  . Hypertension    HPI DEBORRA PHEGLEY is a 66 y.o. female.Patient was noted to have elevated blood pressure taken by home health nurse at home today. Patient denies any complaint and is asymptomatic. Looks normal to her husband. No treatment prior to coming here. She had recently missed several doses of losartan however had started losartan back 3 days ago.  HPI  Past Medical History:  Diagnosis Date  . Hyperlipidemia   . Hypertension   . Seizures [facility])     Patient Active Problem List   Diagnosis Date Noted  . Dilantin toxicity 08/19/2016  . Hypokalemia 08/19/2016  . Dementia 08/19/2016  . Aneurysm of ascending aorta ([place]) 08/19/2016    Past Surgical History:  Procedure Laterality Date  . BREAST CYST ASPIRATION Right     OB History    No data available       Home Medications    Prior to Admission medications   Medication Sig Start Date End Date Taking? Authorizing Provider  aspirin EC 81 MG tablet Take 81 mg by mouth daily.    [provider]  atorvastatin (LIPITOR) 10 MG tablet Take 10 mg by mouth every morning.     [provider]  donepezil (ARICEPT) 5 MG tablet Take 5 mg by mouth at bedtime.    [provider]  HYDROcodone-acetaminophen (NORCO/VICODIN) 5-325 MG tablet Take 1 tablet by mouth every 6 (six) hours as needed for moderate pain. Patient not taking: Reported on 01/06/2016 11/21/15   Lajean Saver, MD  losartan (COZAAR) 50 MG tablet Take 50 mg by mouth daily.    [provider]  Multiple Vitamins-Minerals (MULTIVITAMIN PO) Take 1 tablet by mouth every Monday.     [provider]  phenytoin (DILANTIN) 300 MG ER capsule Take 1 capsule (300 mg total) by mouth daily. Only start taking from Thursday, 08/24/2016 or per physician.  08/24/16   Lavina Hamman, MD  triamterene-hydrochlorothiazide (MAXZIDE-25) 37.5-25 MG per tablet Take 1 tablet by mouth daily.    [provider]    Family History Family History  Problem Relation Age of Onset  . Breast cancer Mother     Social History Social History  Substance Use Topics  . Smoking status: Never Smoker  . Smokeless tobacco: Never Used  . Alcohol use No     Allergies   Patient has no known allergies.   Review of Systems Review of Systems  Unable to perform ROS: Dementia     Physical Exam Updated Vital Signs BP (!) 184/100 (BP Location: Left Arm)   Pulse (!) 56   Temp 98.5 F (36.9 C) (Oral)   Resp 16   SpO2 100%   Physical Exam  Constitutional: She appears well-developed and well-nourished. No distress.  HENT:  Head: Normocephalic and atraumatic.  Eyes: Conjunctivae are normal. Pupils are equal, round, and reactive to light.  Neck: Neck supple. No tracheal deviation present. No thyromegaly present.  Cardiovascular: Regular rhythm.   No murmur heard. Mildly bradycardic  Pulmonary/Chest: Effort normal and breath sounds normal.  Abdominal: Soft. Bowel sounds are normal. She exhibits no distension. There is no tenderness.  Musculoskeletal: Normal range of motion. She exhibits no edema or tenderness.  Neurological: She is alert. Coordination normal.  Follow some commands moves all extremities  Skin:  Skin is warm and dry. No rash noted.  Psychiatric: She has a normal mood and affect.  Nursing note and vitals reviewed.    ED Treatments / Results  Labs (all labs ordered are listed, but only abnormal results are displayed) Labs Reviewed  I-STAT CHEM 8, ED    EKG  EKG Interpretation None       Radiology No results found.  Procedures Procedures (including critical care time)  Medications Ordered in ED Medications - No data to display  Results for orders placed or performed during the hospital encounter of 08/25/16    I-Stat Beta hCG blood, ED ([facility], [facility], [facility] only)  Result Value Ref Range   I-stat hCG, quantitative <5.0 <5 mIU/mL   Comment 3          I-stat chem 8, ed  Result Value Ref Range   Sodium 137 135 - 145 mmol/L   Potassium 3.0 (L) 3.5 - 5.1 mmol/L   Chloride 101 101 - 111 mmol/L   BUN 25 (H) 6 - 20 mg/dL   Creatinine, Ser 0.90 0.44 - 1.00 mg/dL   Glucose, Bld 87 65 - 99 mg/dL   Calcium, Ion 1.03 (L) 1.15 - 1.40 mmol/L   TCO2 26 0 - 100 mmol/L   Hemoglobin 11.9 (L) 12.0 - 15.0 g/dL   HCT 35.0 (L) 36.0 - 46.0 %   Ct Angio Neck W Or Wo Contrast  Result Date: 08/19/2016 CLINICAL DATA:  Initial evaluation for generalized weakness with fatigue, inability to walk. Pulsatile right neck mass. EXAM: CT ANGIOGRAPHY NECK TECHNIQUE: Multidetector CT imaging of the neck was performed using the standard protocol during bolus administration of intravenous contrast. Multiplanar CT image reconstructions and MIPs were obtained to evaluate the vascular anatomy. Carotid stenosis measurements (when applicable) are obtained utilizing NASCET criteria, using the distal internal carotid diameter as the denominator. CONTRAST:  50 cc of Isovue 370. COMPARISON:  None available. FINDINGS: Aortic arch: Mild aneurysmal dilatation of the visualized ascending aorta up to 4 cm in diameter. Mild plaque at the undersurface of the aortic arch. Visualized aorta otherwise unremarkable. No significant atheromatous plaque or flow-limiting stenosis about the origin of the great vessels. Visualized subclavian artery use are widely patent. Right carotid system: Proximal right common carotid artery is tortuous, coursing superficially with a sharp bend, positioned approximately 7 mm deep to the skin (series 7, image 165). Query this as source of the pulsatile neck mass. Right common carotid artery widely patent distally to the bifurcation. No significant atheromatous plaque about the right bifurcation. Right ICA widely patent distally to the  skullbase without stenosis, dissection, aneurysm, or other abnormality. Right external carotid artery and its branches within normal limits. Left carotid system: Left common carotid artery widely patent from its origin to the bifurcation without flow-limiting stenosis. Mild eccentric calcified plaque about the proximal left ICA without stenosis. Left ICA widely patent distally to the skullbase without stenosis, dissection, aneurysm, or other vascular abnormality. Left external carotid artery and its branches within normal limits. Vertebral arteries: Both of the vertebral arteries arise from the subclavian arteries. Right vertebral artery slightly dominant. Vertebral arteries widely patent within the neck without stenosis, dissection, or occlusion. Smooth atheromatous plaque noted within the left vertebral artery as it crosses the dural margin with relatively mild narrowing. Atheromatous irregularity noted within the left V4 segment distally. Skeleton: No acute osseus abnormality. Mild height loss at the T4 vertebral body appears chronic in nature. No worrisome lytic or blastic osseous lesions. Moderate multilevel  degenerative spondylolysis noted within cervical spine, greatest at C4-5 and C5-6. Other neck: No acute soft tissue abnormality within the neck. No adenopathy. Salivary glands are normal. 12 mm hypodense left thyroid nodule noted, of doubtful significance. Thyroid otherwise unremarkable. Upper chest: Visualized upper chest within normal limits. Partially visualized lungs are clear. IMPRESSION: 1. No aneurysm or other vascular abnormality identified within the neck. The proximal right common carotid artery is tortuous, coursing superficially to lie 7 mm deep to the skin at the anterior aspect of the lower right neck. Query this as palpable abnormality of concern. 2. No hemodynamically significant or critical stenosis within the neck. 3. Mild diffuse vascular tortuosity, suggestive of underlying hypertension.  4. Aneurysmal dilatation of the ascending aorta up to 4 cm in maximal diameter. Recommend annual imaging followup by CTA or MRA. This recommendation follows 2010 ACCF/AHA/AATS/ACR/ASA/SCA/SCAI/SIR/STS/SVM Guidelines for the Diagnosis and Management of Patients with Thoracic Aortic Disease. Circulation. 2010; 121: T771-H657 Electronically Signed   By: Jeannine Boga M.D.   On: 08/19/2016 22:04   Initial Impression / Assessment and Plan / ED Course  I have reviewed the triage vital signs and the nursing notes.  Pertinent labs & imaging results that were available during my care of the patient were reviewed by me and considered in my medical decision making (see chart for details).     6:20 PM patient remains asymptomatic. Looks well. Plan she'll receive potassium chloride 40 mEq by mouth prior to discharge. She is encouraged to keep her scheduled plan with her PMD Dr. Katherine Roan next week your blood pressure recheck . No further intervention needed Final Clinical Impressions(s) / ED Diagnoses  Diagnosis #1 hypertension #2 hypokalemia Final diagnoses:  None    New Prescriptions New Prescriptions   No medications on file     Orlie Dakin, MD 08/25/16 [id number]

## 2016-08-27 NOTE — Discharge Summary (Signed)
[facility] Discharge Summary   Patient: Deborah Webb OXB:[id number]   PCP: Vincente Liberty, MD DOB: 1950/04/07   Date of admission: 08/19/2016   Date of discharge: 08/21/2016     Discharge Diagnoses:  Active Problems:   Dilantin toxicity   Hypokalemia   Dementia   Aneurysm of ascending aorta ([place])   Admitted From: home Disposition:  Home with home health  Recommendations for Outpatient Follow-up:  1. Please follow up with PCP in 1-2 weeks   Follow-up Information    Vincente Liberty, MD. Schedule an appointment as soon as possible for a visit in 1 week(s).   Specialty:  Pulmonary Disease Why:  need to draw dilantin level on wednesday Contact information: [place] [address] [phone]        neurology. Schedule an appointment as soon as possible for a visit in 1 month(s).   Why:  need follow up with neurologist in 1 month         Diet recommendation: cardiac diet  Activity: The patient is advised to gradually reintroduce usual activities.  Discharge Condition: good  Code Status: full code  History of present illness: As per the H and P dictated on admission, "Deborah Webb is a 66 y.o. female with medical history significant of seizure disorder, HLD, HTN, dementia    Presented with generalized feeling weak and unable to ambulate for past one week having no nausea and vomiting or any specific discomfort. Family reports decreased PO intake. She is a picky eater, poor appetite. He has not been following no head trauma she's been having occasional episodes of lightheadedness when she stands up but has not syncopized. No fevers or chills no headache no chest pain or shortness of breath denies a neurological is complains patient reports that her Dilantin has been titrated up from 3-4 times a day her primary care provider. Family thinks it was last adjusted in May when she was told to go up from 3 to 4 tablets a day. Patient  have been taking her own medication and is uncertain how many times she took I. Husband and paietn both worry it is possible she took it too many times She has not had a seizure for the past 2 years. "  Hospital Course:  Summary of her active problems in the hospital is as following. 1. Phenantoin toxicity. Patient presented with difficulty walking as well as dizziness and lightheadedness. Also appears to have more confused on the baseline. Further workup showed that she has a significantly elevated phenytoin level. Level consistent with presentation with ataxia. Currently going down. No evidence of suicidal ideation or intention drug overdose. Recently 1 month ago her dose was increased as her levels were low. Last seizure was reported 2 years ago. Plan is to hold dilantin till Thursday and resume at simplified level of 300 mg once a day and recheck with PCP. Recommended to recheck levels on Wednesday. .  2. History of seizure. Patient has been taking Dilantin for many years. Patient reports that she has never been tried on any other seizure medication. Recommend neurologist to consider other medication to avoid potential overdose situation like this. Last seizure was reported 2 years ago, may not need adjustment of Dilantin level if remains asymptomatic.  3. History of dementia. Continue to monitor.  4. Positive neck mass. Superficial carotid artery no evidence of aneurysm or thrombosis.  5. Severe hypokalemia. Replaced   6. Cardiac murmur. Echo performed. Preserved EF, no acute abnormalities.  No further workup.  All other chronic medical condition were stable during the hospitalization.  Patient was seen by physical therapy, who recommended home health, which was arranged by Education officer, museum and case Freight forwarder. On the day of the discharge the patient's vitals were stable , and no other acute medical condition were reported by patient. the patient was felt safe to be  discharge at home with family.  Procedures and Results:  none   Consultations:  none  DISCHARGE MEDICATION: Discharge Medication List as of 08/21/2016  3:50 PM    CONTINUE these medications which have CHANGED   Details  phenytoin (DILANTIN) 300 MG ER capsule Take 1 capsule (300 mg total) by mouth daily. Only start taking from Thursday, 08/24/2016 or per physician., Starting Thu 08/24/2016, Normal      CONTINUE these medications which have NOT CHANGED   Details  aspirin EC 81 MG tablet Take 81 mg by mouth daily., Historical Med    atorvastatin (LIPITOR) 10 MG tablet Take 10 mg by mouth every morning. , Historical Med    donepezil (ARICEPT) 5 MG tablet Take 5 mg by mouth at bedtime., Historical Med    HYDROcodone-acetaminophen (NORCO/VICODIN) 5-325 MG tablet Take 1 tablet by mouth every 6 (six) hours as needed for moderate pain., Starting Sun 11/21/2015, Print    losartan (COZAAR) 50 MG tablet Take 50 mg by mouth daily., Historical Med    Multiple Vitamins-Minerals (MULTIVITAMIN PO) Take 1 tablet by mouth every Monday. , Historical Med    triamterene-hydrochlorothiazide (MAXZIDE-25) 37.5-25 MG per tablet Take 1 tablet by mouth daily., Historical Med       No Known Allergies Discharge Instructions    Diet - low sodium heart healthy    Complete by:  As directed    Discharge instructions    Complete by:  As directed    It is important that you read following instructions as well as go over your medication list with RN to help you understand your care after this hospitalization.  Discharge Instructions: Please follow-up with PCP in one week  Please request your primary care physician to go over all Hospital Tests and Procedure/Radiological results at the follow up,  Please get all Hospital records sent to your PCP by signing hospital release before you go home.   Do not drive, operating heavy machinery, perform activities at heights, swimming or participation in water  activities or provide baby sitting services; until you have been seen by Primary Care Physician or a Neurologist and advised to do so again. Do not take more than prescribed Pain, Sleep and Anxiety Medications. You were cared for by a hospitalist during your hospital stay. If you have any questions about your discharge medications or the care you received while you were in the hospital after you are discharged, you can call the unit and ask to speak with the hospitalist on call if the hospitalist that took care of you is not available.  Once you are discharged, your primary care physician will handle any further medical issues. Please note that NO REFILLS for any discharge medications will be authorized once you are discharged, as it is imperative that you return to your primary care physician (or establish a relationship with a primary care physician if you do not have one) for your aftercare needs so that they can reassess your need for medications and monitor your lab values. You Must read complete instructions/literature along with all the possible adverse reactions/side effects for all the Medicines you take  and that have been prescribed to you. Take any new Medicines after you have completely understood and accept all the possible adverse reactions/side effects. Wear Seat belts while driving. If you have smoked or chewed Tobacco in the last 2 yrs please stop smoking and/or stop any Recreational drug use.   Increase activity slowly    Complete by:  As directed      Discharge Exam: Filed Weights   08/19/16 2153 08/20/16 2100  Weight: 78.1 kg (172 lb 1.6 oz) 78.1 kg (172 lb 1.6 oz)   Vitals:   08/21/16 1019 08/21/16 1438  BP: (!) 163/100 (!) 160/80  Pulse: 73 65  Resp: 18 18  Temp: 98.7 F (37.1 C) 98.3 F (36.8 C)   General: Appear in no distress, no Rash; Oral Mucosa moist. Cardiovascular: S1 and S2 Present, no Murmur, no JVD Respiratory: Bilateral Air entry present and Clear to  Auscultation, no Crackles, no wheezes Abdomen: Bowel Sound present, Soft and no tenderness Extremities: no Pedal edema, no calf tenderness Neurology: Grossly no focal neuro deficit.  The results of significant diagnostics from this hospitalization (including imaging, microbiology, ancillary and laboratory) are listed below for reference.    Significant Diagnostic Studies: Ct Angio Neck W Or Wo Contrast  Result Date: 08/19/2016 CLINICAL DATA:  Initial evaluation for generalized weakness with fatigue, inability to walk. Pulsatile right neck mass. EXAM: CT ANGIOGRAPHY NECK TECHNIQUE: Multidetector CT imaging of the neck was performed using the standard protocol during bolus administration of intravenous contrast. Multiplanar CT image reconstructions and MIPs were obtained to evaluate the vascular anatomy. Carotid stenosis measurements (when applicable) are obtained utilizing NASCET criteria, using the distal internal carotid diameter as the denominator. CONTRAST:  50 cc of Isovue 370. COMPARISON:  None available. FINDINGS: Aortic arch: Mild aneurysmal dilatation of the visualized ascending aorta up to 4 cm in diameter. Mild plaque at the undersurface of the aortic arch. Visualized aorta otherwise unremarkable. No significant atheromatous plaque or flow-limiting stenosis about the origin of the great vessels. Visualized subclavian artery use are widely patent. Right carotid system: Proximal right common carotid artery is tortuous, coursing superficially with a sharp bend, positioned approximately 7 mm deep to the skin (series 7, image 165). Query this as source of the pulsatile neck mass. Right common carotid artery widely patent distally to the bifurcation. No significant atheromatous plaque about the right bifurcation. Right ICA widely patent distally to the skullbase without stenosis, dissection, aneurysm, or other abnormality. Right external carotid artery and its branches within normal limits. Left carotid  system: Left common carotid artery widely patent from its origin to the bifurcation without flow-limiting stenosis. Mild eccentric calcified plaque about the proximal left ICA without stenosis. Left ICA widely patent distally to the skullbase without stenosis, dissection, aneurysm, or other vascular abnormality. Left external carotid artery and its branches within normal limits. Vertebral arteries: Both of the vertebral arteries arise from the subclavian arteries. Right vertebral artery slightly dominant. Vertebral arteries widely patent within the neck without stenosis, dissection, or occlusion. Smooth atheromatous plaque noted within the left vertebral artery as it crosses the dural margin with relatively mild narrowing. Atheromatous irregularity noted within the left V4 segment distally. Skeleton: No acute osseus abnormality. Mild height loss at the T4 vertebral body appears chronic in nature. No worrisome lytic or blastic osseous lesions. Moderate multilevel degenerative spondylolysis noted within cervical spine, greatest at C4-5 and C5-6. Other neck: No acute soft tissue abnormality within the neck. No adenopathy. Salivary glands are normal. 12 mm  hypodense left thyroid nodule noted, of doubtful significance. Thyroid otherwise unremarkable. Upper chest: Visualized upper chest within normal limits. Partially visualized lungs are clear. IMPRESSION: 1. No aneurysm or other vascular abnormality identified within the neck. The proximal right common carotid artery is tortuous, coursing superficially to lie 7 mm deep to the skin at the anterior aspect of the lower right neck. Query this as palpable abnormality of concern. 2. No hemodynamically significant or critical stenosis within the neck. 3. Mild diffuse vascular tortuosity, suggestive of underlying hypertension. 4. Aneurysmal dilatation of the ascending aorta up to 4 cm in maximal diameter. Recommend annual imaging followup by CTA or MRA. This recommendation  follows 2010 ACCF/AHA/AATS/ACR/ASA/SCA/SCAI/SIR/STS/SVM Guidelines for the Diagnosis and Management of Patients with Thoracic Aortic Disease. Circulation. 2010; 121: O878-M767 Electronically Signed   By: Jeannine Boga M.D.   On: 08/19/2016 22:04    Microbiology: No results found for this or any previous visit (from the past 240 hour(s)).   Labs: CBC:  Recent Labs Lab 08/25/16 1811  HGB 11.9*  HCT 20.9*   Basic Metabolic Panel:  Recent Labs Lab 08/21/16 0417 08/21/16 1410 08/25/16 1811  NA 137 134* 137  K 2.7* 4.1 3.0*  CL 98* 98* 101  CO2 30 26  --   GLUCOSE 96 100* 87  BUN 21* 20 25*  CREATININE 1.01* 1.15* 0.90  CALCIUM 8.5* 8.5*  --    Liver Function Tests:  Recent Labs Lab 08/21/16 0417  AST 17  ALT 18  ALKPHOS 62  BILITOT 0.4  PROT 6.1*  ALBUMIN 3.4*   No results for input(s): LIPASE, AMYLASE in the last 168 hours. No results for input(s): AMMONIA in the last 168 hours. Cardiac Enzymes: No results for input(s): CKTOTAL, CKMB, CKMBINDEX, TROPONINI in the last 168 hours. BNP (last 3 results) No results for input(s): BNP in the last 8760 hours. CBG: No results for input(s): GLUCAP in the last 168 hours. Time spent: 35 minutes  Signed:  Berle Mull  [facility] 08/21/2016 , 5:19 PM

## 2016-08-29 ENCOUNTER — Telehealth: Payer: Self-pay | Admitting: Neurology

## 2016-08-29 ENCOUNTER — Encounter: Payer: Self-pay | Admitting: Neurology

## 2016-08-29 ENCOUNTER — Ambulatory Visit (INDEPENDENT_AMBULATORY_CARE_PROVIDER_SITE_OTHER): Payer: PPO | Admitting: Neurology

## 2016-08-29 VITALS — BP 146/99 | HR 88 | Ht 71.0 in | Wt 165.8 lb

## 2016-08-29 DIAGNOSIS — I7121 Aneurysm of the ascending aorta, without rupture: Secondary | ICD-10-CM

## 2016-08-29 DIAGNOSIS — I712 Thoracic aortic aneurysm, without rupture: Secondary | ICD-10-CM

## 2016-08-29 DIAGNOSIS — R569 Unspecified convulsions: Secondary | ICD-10-CM

## 2016-08-29 DIAGNOSIS — F0390 Unspecified dementia without behavioral disturbance: Secondary | ICD-10-CM | POA: Diagnosis not present

## 2016-08-29 DIAGNOSIS — T420X1A Poisoning by hydantoin derivatives, accidental (unintentional), initial encounter: Secondary | ICD-10-CM

## 2016-08-29 MED ORDER — LAMOTRIGINE 100 MG PO TABS: 100.0000 mg | ORAL_TABLET | Freq: Every day | ORAL | 4 refills | Status: DC

## 2016-08-29 MED ORDER — LAMOTRIGINE 25 MG PO TABS: ORAL_TABLET | ORAL | 0 refills | Status: DC

## 2016-08-29 NOTE — Telephone Encounter (Signed)
Left patient a message notifying her that the MRI has been canceled.  She does need to proceed with the EEG and start the new medication sent to the pharmacy.  Left our number to call back with any questions.

## 2016-08-29 NOTE — Telephone Encounter (Signed)
I ordered MRI during her office visit, I was able to find her MRI films and report in 2013, extensive left encephalomalacia, will cancel MRI brain
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
## 2016-08-29 NOTE — Patient Instructions (Addendum)
Tapering down Dilantin  Titrating lamotrigine to 100 mg twice a day

## 2016-08-29 NOTE — Telephone Encounter (Signed)
Noted, thank you

## 2016-08-30 ENCOUNTER — Telehealth: Payer: Self-pay | Admitting: Neurology

## 2016-08-30 NOTE — Telephone Encounter (Signed)
Called lamotrigine 25mg , with titration instructions, to [facility] (spoke to [place]).  They will get the prescription ready for the patient.  Returned call to patient to let them know this has been handled.

## 2016-08-30 NOTE — Telephone Encounter (Signed)
Pt.'s  Husband stopped by the lobby to state that his wife's 25 mg Lamictal was not at [facility] the 100 mg dose was. Best call back is [phone]

## 2016-08-31 DIAGNOSIS — R350 Frequency of micturition: Secondary | ICD-10-CM | POA: Diagnosis not present

## 2016-08-31 DIAGNOSIS — E559 Vitamin D deficiency, unspecified: Secondary | ICD-10-CM | POA: Diagnosis not present

## 2016-08-31 DIAGNOSIS — Z79899 Other long term (current) drug therapy: Secondary | ICD-10-CM | POA: Diagnosis not present

## 2016-08-31 DIAGNOSIS — G309 Alzheimer's disease, unspecified: Secondary | ICD-10-CM | POA: Diagnosis not present

## 2016-08-31 DIAGNOSIS — R569 Unspecified convulsions: Secondary | ICD-10-CM | POA: Diagnosis not present

## 2016-08-31 DIAGNOSIS — Z9181 History of falling: Secondary | ICD-10-CM | POA: Diagnosis not present

## 2016-08-31 DIAGNOSIS — I119 Hypertensive heart disease without heart failure: Secondary | ICD-10-CM | POA: Diagnosis not present

## 2016-08-31 DIAGNOSIS — M2550 Pain in unspecified joint: Secondary | ICD-10-CM | POA: Diagnosis not present

## 2016-08-31 DIAGNOSIS — Z6823 Body mass index (BMI) 23.0-23.9, adult: Secondary | ICD-10-CM | POA: Diagnosis not present

## 2016-08-31 DIAGNOSIS — T420X1A Poisoning by hydantoin derivatives, accidental (unintentional), initial encounter: Secondary | ICD-10-CM | POA: Diagnosis not present

## 2016-08-31 DIAGNOSIS — E7800 Pure hypercholesterolemia, unspecified: Secondary | ICD-10-CM | POA: Diagnosis not present

## 2016-08-31 DIAGNOSIS — M159 Polyosteoarthritis, unspecified: Secondary | ICD-10-CM | POA: Diagnosis not present

## 2016-09-01 DIAGNOSIS — G40909 Epilepsy, unspecified, not intractable, without status epilepticus: Secondary | ICD-10-CM | POA: Diagnosis not present

## 2016-09-01 DIAGNOSIS — Z7982 Long term (current) use of aspirin: Secondary | ICD-10-CM | POA: Diagnosis not present

## 2016-09-01 DIAGNOSIS — F0390 Unspecified dementia without behavioral disturbance: Secondary | ICD-10-CM | POA: Diagnosis not present

## 2016-09-01 DIAGNOSIS — R531 Weakness: Secondary | ICD-10-CM | POA: Diagnosis not present

## 2016-09-01 DIAGNOSIS — T420X1D Poisoning by hydantoin derivatives, accidental (unintentional), subsequent encounter: Secondary | ICD-10-CM | POA: Diagnosis not present

## 2016-09-01 DIAGNOSIS — E785 Hyperlipidemia, unspecified: Secondary | ICD-10-CM | POA: Diagnosis not present

## 2016-09-01 DIAGNOSIS — I10 Essential (primary) hypertension: Secondary | ICD-10-CM | POA: Diagnosis not present

## 2016-09-04 ENCOUNTER — Encounter (HOSPITAL_COMMUNITY): Payer: Self-pay

## 2016-09-04 ENCOUNTER — Emergency Department (HOSPITAL_COMMUNITY)
Admission: EM | Admit: 2016-09-04 | Discharge: 2016-09-04 | Disposition: A | Discharge status: Home / Self Care | Payer: PPO | Attending: Emergency Medicine | Admitting: Emergency Medicine

## 2016-09-04 DIAGNOSIS — T420X1D Poisoning by hydantoin derivatives, accidental (unintentional), subsequent encounter: Secondary | ICD-10-CM | POA: Diagnosis not present

## 2016-09-04 DIAGNOSIS — R531 Weakness: Secondary | ICD-10-CM | POA: Diagnosis not present

## 2016-09-04 DIAGNOSIS — G40909 Epilepsy, unspecified, not intractable, without status epilepticus: Secondary | ICD-10-CM | POA: Diagnosis not present

## 2016-09-04 DIAGNOSIS — E785 Hyperlipidemia, unspecified: Secondary | ICD-10-CM | POA: Diagnosis not present

## 2016-09-04 DIAGNOSIS — I10 Essential (primary) hypertension: Secondary | ICD-10-CM | POA: Diagnosis not present

## 2016-09-04 DIAGNOSIS — F0390 Unspecified dementia without behavioral disturbance: Secondary | ICD-10-CM | POA: Diagnosis not present

## 2016-09-04 DIAGNOSIS — Z7982 Long term (current) use of aspirin: Secondary | ICD-10-CM | POA: Insufficient documentation

## 2016-09-04 DIAGNOSIS — Z79899 Other long term (current) drug therapy: Secondary | ICD-10-CM | POA: Diagnosis not present

## 2016-09-04 NOTE — Discharge Instructions (Signed)
Please call your PCP to discuss any further medications changes Measure your blood pressure once daily at the same time and write it down. You will then review this with your primary care doctor. Return for worsening symptoms, including chest pain, difficulty breathing, confusion, intractable vomiting or any other symptoms concerning to you.

## 2016-09-04 NOTE — ED Triage Notes (Signed)
Pt reports her home health nurse told her to come to the ED for continued HTN. Pt seen here last week for same. Pt has no complaints, denies headaches. Alert and oriented.

## 2016-09-04 NOTE — ED Provider Notes (Signed)
[facility] DEPT Provider Note   CSN: [id number] Arrival date & time: 09/04/16  1503   By signing my name below, I, Eunice Blase, attest that this documentation has been prepared under the direction and in the presence of Oleta Mouse, Eugenia Mcalpine, MD. Electronically signed, Eunice Blase, ED Scribe. 09/04/16. 7:08 PM.   History   Chief Complaint Chief Complaint  Patient presents with  . Hypertension   The history is provided by the patient, medical records and the spouse. No language interpreter was used.  Hypertension  This is a chronic problem. The current episode started 6 to 12 hours ago. The problem occurs rarely. The problem has been gradually improving. Pertinent negatives include no chest pain, no abdominal pain, no headaches and no shortness of breath.    Deborah Webb is a 66 y.o. female with h/o HTN and HLD presenting to the Emergency Department asymptomatically concerning high blood pressure readings measured at home earlier today. Some increased urinary frequency reported by husband. Pt unsure what the abnormal blood pressure reading was; she states a nurse measured it for her and recommended that she report to [facility] ED. She states she is compliant with with all prescribed medications at home. She was evaluated by her PCP 4 days ago for HTN with no abnormal findings or medication changes on that evaluation. Pt recently seen in [facility] ED for similar with no medication changes noted during the evaluation. Pt prescribed a new seizure medication recently. No headache, confusion, N/V, chest pain, SOB, dysuria, numbness, weakness or pain anywhere.  Past Medical History:  Diagnosis Date  . Hyperlipidemia   . Hypertension   . Seizures [facility])     Patient Active Problem List   Diagnosis Date Noted  . Dilantin toxicity 08/19/2016  . Hypokalemia 08/19/2016  . Dementia 08/19/2016  . Aneurysm of ascending aorta ([place]) 08/19/2016    Past Surgical History:  Procedure Laterality Date  .  BRAIN SURGERY  1968  . BREAST CYST ASPIRATION Right     OB History    No data available       Home Medications    Prior to Admission medications   Medication Sig Start Date End Date Taking? Authorizing Provider  aspirin EC 81 MG tablet Take 81 mg by mouth daily.    [provider]  atorvastatin (LIPITOR) 10 MG tablet Take 10 mg by mouth every morning.     [provider]  donepezil (ARICEPT) 5 MG tablet Take 5 mg by mouth at bedtime.    [provider]  HYDROcodone-acetaminophen (NORCO/VICODIN) 5-325 MG tablet Take 1 tablet by mouth every 6 (six) hours as needed for moderate pain. 11/21/15   Lajean Saver, MD  lamoTRIgine (LAMICTAL) 100 MG tablet Take 1 tablet (100 mg total) by mouth daily. 08/29/16   Marcial Pacas, MD  lamoTRIgine (LAMICTAL) 25 MG tablet 1 tablet twice a day for the first week 2 tablets twice a day for the second week 3 tablets twice a day for the third week 4 tablets twice a day for the fourth week  For total of 140 tablets  After finish titration with small dose of lamotrigine 25 mg, change to lamotrigine 100 mg twice a day 08/29/16   Marcial Pacas, MD  losartan (COZAAR) 50 MG tablet Take 50 mg by mouth daily.    [provider]  Multiple Vitamins-Minerals (MULTIVITAMIN PO) Take 1 tablet by mouth every Monday.     [provider]  phenytoin (DILANTIN) 300 MG ER capsule Take  1 capsule (300 mg total) by mouth daily. Only start taking from Thursday, 08/24/2016 or per physician. 08/24/16   Lavina Hamman, MD  triamterene-hydrochlorothiazide (MAXZIDE-25) 37.5-25 MG per tablet Take 1 tablet by mouth daily.    [provider]    Family History Family History  Problem Relation Age of Onset  . Breast cancer Mother   . Other Father        unsure of medical history    Social History Social History  Substance Use Topics  . Smoking status: Never Smoker  . Smokeless tobacco: Never Used  . Alcohol use No      Allergies   Patient has no known allergies.   Review of Systems Review of Systems  Respiratory: Negative for shortness of breath.   Cardiovascular: Negative for chest pain.  Gastrointestinal: Negative for abdominal pain.  Neurological: Negative for dizziness and headaches.  Psychiatric/Behavioral: Negative for confusion.  All other systems reviewed and are negative.    Physical Exam Updated Vital Signs BP (!) 181/110 (BP Location: Left Arm)   Pulse (!) 54   Temp 98 F (36.7 C) (Oral)   Resp 16   SpO2 100%   Physical Exam Physical Exam  Nursing note and vitals reviewed. Constitutional: Well developed, well nourished, non-toxic, and in no acute distress Head: Normocephalic and atraumatic.  Mouth/Throat: Oropharynx is clear and moist.  Neck: Normal range of motion. Neck supple.  Cardiovascular: Normal rate and regular rhythm.   Pulmonary/Chest: Effort normal and breath sounds normal.  Abdominal: Soft. There is no tenderness. There is no rebound and no guarding.  Musculoskeletal: Normal range of motion.  Skin: Skin is warm and dry.  Psychiatric: Cooperative  Neurological:  Alert, oriented to person. Memory grossly in tact. Fluent speech. No dysarthria or aphasia.  Cranial nerves: Pupils are symmetric, and reactive to light. EOMI without nystagmus. No gaze deviation. Facial muscles symmetric with activation. Sensation to light touch over face in tact bilaterally. Hearing grossly in tact. Palate elevates symmetrically. Head turn and shoulder shrug are intact. Tongue midline.  Reflexes defered.  Muscle bulk and tone normal. No pronator drift. Moves all extremities symmetrically. Sensation to light touch is in tact throughout in bilateral upper and lower extremities. Coordination reveals no dysmetria with finger to nose. Gait is narrow-based and steady. Non-ataxic.   ED Treatments / Results  DIAGNOSTIC STUDIES: Oxygen Saturation is 100% on RA, NL by my interpretation.     COORDINATION OF CARE: 7:03 PM-Discussed next steps with pt. Pt verbalized understanding and is agreeable with the plan. Will order repeat blood pressure check and reassess.   Labs (all labs ordered are listed, but only abnormal results are displayed) Labs Reviewed - No data to display  EKG  EKG Interpretation None       Radiology No results found.  Procedures Procedures (including critical care time)  Medications Ordered in ED Medications - No data to display   Initial Impression / Assessment and Plan / ED Course  I have reviewed the triage vital signs and the nursing notes.  Pertinent labs & imaging results that were available during my care of the patient were reviewed by me and considered in my medical decision making (see chart for details).     66 year old female who presents with elevated blood pressures. On record review, has had multiple elevated blood pressures this month, and on follow-up appointment last week with PCP no medication changes were made. She is asymptomatic, well-appearing. Exam is nonfocal. Discussed with patient  and husband to call primary care doctor regarding elevated blood pressures discussed medication management. I do not feel that there is an urgency to change her medications currently through the emergency department. Strict return and follow-up instructions reviewed. Patient and husband expressed understanding of all discharge instructions and felt comfortable with the plan of care.   Final Clinical Impressions(s) / ED Diagnoses   Final diagnoses:  Hypertension, unspecified type    New Prescriptions New Prescriptions   No medications on file   I personally performed the services described in this documentation, which was scribed in my presence. The recorded information has been reviewed and is accurate.    Forde Dandy, MD 09/04/16 [phone]

## 2016-09-05 DIAGNOSIS — T420X1D Poisoning by hydantoin derivatives, accidental (unintentional), subsequent encounter: Secondary | ICD-10-CM | POA: Diagnosis not present

## 2016-09-05 DIAGNOSIS — E785 Hyperlipidemia, unspecified: Secondary | ICD-10-CM | POA: Diagnosis not present

## 2016-09-05 DIAGNOSIS — G40909 Epilepsy, unspecified, not intractable, without status epilepticus: Secondary | ICD-10-CM | POA: Diagnosis not present

## 2016-09-05 DIAGNOSIS — R531 Weakness: Secondary | ICD-10-CM | POA: Diagnosis not present

## 2016-09-05 DIAGNOSIS — I10 Essential (primary) hypertension: Secondary | ICD-10-CM | POA: Diagnosis not present

## 2016-09-05 DIAGNOSIS — Z7982 Long term (current) use of aspirin: Secondary | ICD-10-CM | POA: Diagnosis not present

## 2016-09-05 DIAGNOSIS — F0390 Unspecified dementia without behavioral disturbance: Secondary | ICD-10-CM | POA: Diagnosis not present

## 2016-09-07 DIAGNOSIS — E785 Hyperlipidemia, unspecified: Secondary | ICD-10-CM | POA: Diagnosis not present

## 2016-09-07 DIAGNOSIS — R531 Weakness: Secondary | ICD-10-CM | POA: Diagnosis not present

## 2016-09-07 DIAGNOSIS — I10 Essential (primary) hypertension: Secondary | ICD-10-CM | POA: Diagnosis not present

## 2016-09-07 DIAGNOSIS — G40909 Epilepsy, unspecified, not intractable, without status epilepticus: Secondary | ICD-10-CM | POA: Diagnosis not present

## 2016-09-07 DIAGNOSIS — F0390 Unspecified dementia without behavioral disturbance: Secondary | ICD-10-CM | POA: Diagnosis not present

## 2016-09-07 DIAGNOSIS — T420X1D Poisoning by hydantoin derivatives, accidental (unintentional), subsequent encounter: Secondary | ICD-10-CM | POA: Diagnosis not present

## 2016-09-07 DIAGNOSIS — Z7982 Long term (current) use of aspirin: Secondary | ICD-10-CM | POA: Diagnosis not present

## 2016-09-10 DIAGNOSIS — Z7982 Long term (current) use of aspirin: Secondary | ICD-10-CM | POA: Diagnosis not present

## 2016-09-10 DIAGNOSIS — G40909 Epilepsy, unspecified, not intractable, without status epilepticus: Secondary | ICD-10-CM | POA: Diagnosis not present

## 2016-09-10 DIAGNOSIS — T420X1D Poisoning by hydantoin derivatives, accidental (unintentional), subsequent encounter: Secondary | ICD-10-CM | POA: Diagnosis not present

## 2016-09-10 DIAGNOSIS — I10 Essential (primary) hypertension: Secondary | ICD-10-CM | POA: Diagnosis not present

## 2016-09-10 DIAGNOSIS — F0390 Unspecified dementia without behavioral disturbance: Secondary | ICD-10-CM | POA: Diagnosis not present

## 2016-09-10 DIAGNOSIS — E785 Hyperlipidemia, unspecified: Secondary | ICD-10-CM | POA: Diagnosis not present

## 2016-09-10 DIAGNOSIS — R531 Weakness: Secondary | ICD-10-CM | POA: Diagnosis not present

## 2016-09-11 DIAGNOSIS — R531 Weakness: Secondary | ICD-10-CM | POA: Diagnosis not present

## 2016-09-11 DIAGNOSIS — G40909 Epilepsy, unspecified, not intractable, without status epilepticus: Secondary | ICD-10-CM | POA: Diagnosis not present

## 2016-09-11 DIAGNOSIS — E785 Hyperlipidemia, unspecified: Secondary | ICD-10-CM | POA: Diagnosis not present

## 2016-09-11 DIAGNOSIS — T420X1D Poisoning by hydantoin derivatives, accidental (unintentional), subsequent encounter: Secondary | ICD-10-CM | POA: Diagnosis not present

## 2016-09-11 DIAGNOSIS — F0390 Unspecified dementia without behavioral disturbance: Secondary | ICD-10-CM | POA: Diagnosis not present

## 2016-09-11 DIAGNOSIS — Z7982 Long term (current) use of aspirin: Secondary | ICD-10-CM | POA: Diagnosis not present

## 2016-09-11 DIAGNOSIS — I10 Essential (primary) hypertension: Secondary | ICD-10-CM | POA: Diagnosis not present

## 2016-09-15 DIAGNOSIS — Z7982 Long term (current) use of aspirin: Secondary | ICD-10-CM | POA: Diagnosis not present

## 2016-09-15 DIAGNOSIS — E785 Hyperlipidemia, unspecified: Secondary | ICD-10-CM | POA: Diagnosis not present

## 2016-09-15 DIAGNOSIS — F0390 Unspecified dementia without behavioral disturbance: Secondary | ICD-10-CM | POA: Diagnosis not present

## 2016-09-15 DIAGNOSIS — I10 Essential (primary) hypertension: Secondary | ICD-10-CM | POA: Diagnosis not present

## 2016-09-15 DIAGNOSIS — T420X1D Poisoning by hydantoin derivatives, accidental (unintentional), subsequent encounter: Secondary | ICD-10-CM | POA: Diagnosis not present

## 2016-09-15 DIAGNOSIS — R531 Weakness: Secondary | ICD-10-CM | POA: Diagnosis not present

## 2016-09-15 DIAGNOSIS — G40909 Epilepsy, unspecified, not intractable, without status epilepticus: Secondary | ICD-10-CM | POA: Diagnosis not present

## 2016-09-18 ENCOUNTER — Telehealth: Payer: Self-pay | Admitting: Neurology

## 2016-09-18 DIAGNOSIS — E785 Hyperlipidemia, unspecified: Secondary | ICD-10-CM | POA: Diagnosis not present

## 2016-09-18 DIAGNOSIS — I10 Essential (primary) hypertension: Secondary | ICD-10-CM | POA: Diagnosis not present

## 2016-09-18 DIAGNOSIS — T420X1D Poisoning by hydantoin derivatives, accidental (unintentional), subsequent encounter: Secondary | ICD-10-CM | POA: Diagnosis not present

## 2016-09-18 DIAGNOSIS — Z7982 Long term (current) use of aspirin: Secondary | ICD-10-CM | POA: Diagnosis not present

## 2016-09-18 DIAGNOSIS — R531 Weakness: Secondary | ICD-10-CM | POA: Diagnosis not present

## 2016-09-18 DIAGNOSIS — G40909 Epilepsy, unspecified, not intractable, without status epilepticus: Secondary | ICD-10-CM | POA: Diagnosis not present

## 2016-09-18 DIAGNOSIS — F0390 Unspecified dementia without behavioral disturbance: Secondary | ICD-10-CM | POA: Diagnosis not present

## 2016-09-18 NOTE — Telephone Encounter (Signed)
Patient's husband in the lobby with a written Rx that he has questions about. He can wait 30 to 45 minutes. If not, best call back is [phone]

## 2016-09-18 NOTE — Telephone Encounter (Signed)
Spoke to patient and clarified Lamical instructions.

## 2016-09-28 ENCOUNTER — Ambulatory Visit (INDEPENDENT_AMBULATORY_CARE_PROVIDER_SITE_OTHER): Payer: PPO | Admitting: Neurology

## 2016-09-28 DIAGNOSIS — R569 Unspecified convulsions: Secondary | ICD-10-CM

## 2016-09-28 DIAGNOSIS — I712 Thoracic aortic aneurysm, without rupture: Secondary | ICD-10-CM

## 2016-09-28 DIAGNOSIS — F0390 Unspecified dementia without behavioral disturbance: Secondary | ICD-10-CM

## 2016-09-28 DIAGNOSIS — I7121 Aneurysm of the ascending aorta, without rupture: Secondary | ICD-10-CM

## 2016-09-28 DIAGNOSIS — T420X1A Poisoning by hydantoin derivatives, accidental (unintentional), initial encounter: Secondary | ICD-10-CM

## 2016-10-06 NOTE — Procedures (Signed)
   HISTORY: 66 year old female, history of seizure, traumatic brain injury  TECHNIQUE:  16 channel EEG was performed based on standard 10-16 international system. One channel was dedicated to EKG, which has demonstrates normal sinus rhythm of 66 beats per minutes.  Upon awakening, there is apparent asymmetry of the background activity, left side has higher amplitude irregular high amplitude theta range activity with frequent T3, C3 sharp transient. Right side has low amplitude, mildly irregular mixed alpha and slower frequency activity  There was no evidence of epileptiform discharge.  Photic stimulation was performed, there is continued asymmetry of the background activity, there was no epileptiform discharge noted.  Hyperventilation was performed, there was no abnormality elicit.  No sleep was achieved.  CONCLUSION: This is an abnormal EEG, there is diffuse slowing, focal irritability noted at the left hemisphere, mainly involving the left at T3, C3 leads, indicate focal irritability, consistent with her history of traumatic brain injury, extensive left parietal region encephalomalacia.    Marcial Pacas, M.D. Ph.D.  [facility] [place], [place] [address] Phone: [phone] Fax:      [phone]

## 2016-11-07 DIAGNOSIS — I119 Hypertensive heart disease without heart failure: Secondary | ICD-10-CM | POA: Diagnosis not present

## 2016-11-07 DIAGNOSIS — G40909 Epilepsy, unspecified, not intractable, without status epilepticus: Secondary | ICD-10-CM | POA: Diagnosis not present

## 2016-11-07 DIAGNOSIS — M159 Polyosteoarthritis, unspecified: Secondary | ICD-10-CM | POA: Diagnosis not present

## 2016-11-07 DIAGNOSIS — R351 Nocturia: Secondary | ICD-10-CM | POA: Diagnosis not present

## 2016-11-07 DIAGNOSIS — Z6823 Body mass index (BMI) 23.0-23.9, adult: Secondary | ICD-10-CM | POA: Diagnosis not present

## 2016-11-07 DIAGNOSIS — Z9181 History of falling: Secondary | ICD-10-CM | POA: Diagnosis not present

## 2016-11-07 DIAGNOSIS — M2550 Pain in unspecified joint: Secondary | ICD-10-CM | POA: Diagnosis not present

## 2016-11-07 DIAGNOSIS — G309 Alzheimer's disease, unspecified: Secondary | ICD-10-CM | POA: Diagnosis not present

## 2016-11-07 DIAGNOSIS — E7800 Pure hypercholesterolemia, unspecified: Secondary | ICD-10-CM | POA: Diagnosis not present

## 2016-11-07 DIAGNOSIS — E559 Vitamin D deficiency, unspecified: Secondary | ICD-10-CM | POA: Diagnosis not present

## 2016-11-07 DIAGNOSIS — Z79899 Other long term (current) drug therapy: Secondary | ICD-10-CM | POA: Diagnosis not present

## 2016-11-07 DIAGNOSIS — R569 Unspecified convulsions: Secondary | ICD-10-CM | POA: Diagnosis not present

## 2016-12-05 ENCOUNTER — Ambulatory Visit (INDEPENDENT_AMBULATORY_CARE_PROVIDER_SITE_OTHER): Payer: PPO | Admitting: Neurology

## 2016-12-05 ENCOUNTER — Encounter: Payer: Self-pay | Admitting: Neurology

## 2016-12-05 VITALS — BP 143/92 | HR 70 | Ht 71.0 in | Wt 166.0 lb

## 2016-12-05 DIAGNOSIS — R569 Unspecified convulsions: Secondary | ICD-10-CM

## 2016-12-05 DIAGNOSIS — F0390 Unspecified dementia without behavioral disturbance: Secondary | ICD-10-CM | POA: Diagnosis not present

## 2016-12-05 MED ORDER — DONEPEZIL HCL 10 MG PO TABS: 10.0000 mg | ORAL_TABLET | Freq: Every day | ORAL | 4 refills | Status: DC

## 2016-12-05 MED ORDER — MEMANTINE HCL 10 MG PO TABS: 10.0000 mg | ORAL_TABLET | Freq: Two times a day (BID) | ORAL | 11 refills | Status: DC
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
## 2017-01-01 ENCOUNTER — Telehealth: Payer: Self-pay | Admitting: Neurology

## 2017-01-01 MED ORDER — LAMOTRIGINE 100 MG PO TABS: 100.0000 mg | ORAL_TABLET | Freq: Two times a day (BID) | ORAL | 3 refills | Status: DC

## 2017-01-01 NOTE — Telephone Encounter (Signed)
LMOM.  Per last ov note, pt. should be taking Lamictal bid.  New rx. escribed to [facility].  Pt./husband do not need to return this call unless they have other questions/fim

## 2017-01-01 NOTE — Telephone Encounter (Signed)
Husband presented to the lobby today stating wife has been taking the lamotrigine twice daily, not once as instructed and they would not give them a refill as it's not due. Please call to advise [phone].

## 2017-01-08 DIAGNOSIS — Z79899 Other long term (current) drug therapy: Secondary | ICD-10-CM | POA: Diagnosis not present

## 2017-01-08 DIAGNOSIS — G309 Alzheimer's disease, unspecified: Secondary | ICD-10-CM | POA: Diagnosis not present

## 2017-01-08 DIAGNOSIS — Z6823 Body mass index (BMI) 23.0-23.9, adult: Secondary | ICD-10-CM | POA: Diagnosis not present

## 2017-01-08 DIAGNOSIS — Z23 Encounter for immunization: Secondary | ICD-10-CM | POA: Diagnosis not present

## 2017-01-08 DIAGNOSIS — M159 Polyosteoarthritis, unspecified: Secondary | ICD-10-CM | POA: Diagnosis not present

## 2017-01-08 DIAGNOSIS — R569 Unspecified convulsions: Secondary | ICD-10-CM | POA: Diagnosis not present

## 2017-01-08 DIAGNOSIS — M2550 Pain in unspecified joint: Secondary | ICD-10-CM | POA: Diagnosis not present

## 2017-01-08 DIAGNOSIS — N183 Chronic kidney disease, stage 3 (moderate): Secondary | ICD-10-CM | POA: Diagnosis not present

## 2017-01-08 DIAGNOSIS — I119 Hypertensive heart disease without heart failure: Secondary | ICD-10-CM | POA: Diagnosis not present

## 2017-01-08 DIAGNOSIS — E559 Vitamin D deficiency, unspecified: Secondary | ICD-10-CM | POA: Diagnosis not present

## 2017-01-08 DIAGNOSIS — Z9181 History of falling: Secondary | ICD-10-CM | POA: Diagnosis not present

## 2017-01-08 DIAGNOSIS — E7800 Pure hypercholesterolemia, unspecified: Secondary | ICD-10-CM | POA: Diagnosis not present

## 2017-01-22 DIAGNOSIS — H5211 Myopia, right eye: Secondary | ICD-10-CM | POA: Diagnosis not present

## 2017-01-22 DIAGNOSIS — H04123 Dry eye syndrome of bilateral lacrimal glands: Secondary | ICD-10-CM | POA: Diagnosis not present

## 2017-01-22 DIAGNOSIS — H524 Presbyopia: Secondary | ICD-10-CM | POA: Diagnosis not present

## 2017-01-22 DIAGNOSIS — H52222 Regular astigmatism, left eye: Secondary | ICD-10-CM | POA: Diagnosis not present

## 2017-01-22 DIAGNOSIS — H53483 Generalized contraction of visual field, bilateral: Secondary | ICD-10-CM | POA: Diagnosis not present

## 2017-01-22 DIAGNOSIS — H353131 Nonexudative age-related macular degeneration, bilateral, early dry stage: Secondary | ICD-10-CM | POA: Diagnosis not present

## 2017-01-22 DIAGNOSIS — H52221 Regular astigmatism, right eye: Secondary | ICD-10-CM | POA: Diagnosis not present

## 2017-03-29 DIAGNOSIS — G309 Alzheimer's disease, unspecified: Secondary | ICD-10-CM | POA: Diagnosis not present

## 2017-03-29 DIAGNOSIS — I119 Hypertensive heart disease without heart failure: Secondary | ICD-10-CM | POA: Diagnosis not present

## 2017-03-29 DIAGNOSIS — E7800 Pure hypercholesterolemia, unspecified: Secondary | ICD-10-CM | POA: Diagnosis not present

## 2017-03-29 DIAGNOSIS — M2550 Pain in unspecified joint: Secondary | ICD-10-CM | POA: Diagnosis not present

## 2017-03-29 DIAGNOSIS — G40909 Epilepsy, unspecified, not intractable, without status epilepticus: Secondary | ICD-10-CM | POA: Diagnosis not present

## 2017-03-29 DIAGNOSIS — R350 Frequency of micturition: Secondary | ICD-10-CM | POA: Diagnosis not present

## 2017-03-29 DIAGNOSIS — Z6827 Body mass index (BMI) 27.0-27.9, adult: Secondary | ICD-10-CM | POA: Diagnosis not present

## 2017-03-29 DIAGNOSIS — E559 Vitamin D deficiency, unspecified: Secondary | ICD-10-CM | POA: Diagnosis not present

## 2017-03-29 DIAGNOSIS — R569 Unspecified convulsions: Secondary | ICD-10-CM | POA: Diagnosis not present

## 2017-03-29 DIAGNOSIS — N183 Chronic kidney disease, stage 3 (moderate): Secondary | ICD-10-CM | POA: Diagnosis not present

## 2017-03-29 DIAGNOSIS — M159 Polyosteoarthritis, unspecified: Secondary | ICD-10-CM | POA: Diagnosis not present

## 2017-03-29 DIAGNOSIS — Z79899 Other long term (current) drug therapy: Secondary | ICD-10-CM | POA: Diagnosis not present

## 2017-06-11 ENCOUNTER — Other Ambulatory Visit: Payer: Self-pay | Admitting: Pulmonary Disease

## 2017-06-11 DIAGNOSIS — Z1231 Encounter for screening mammogram for malignant neoplasm of breast: Secondary | ICD-10-CM

## 2017-07-13 ENCOUNTER — Ambulatory Visit
Admission: RE | Admit: 2017-07-13 | Discharge: 2017-07-13 | Disposition: A | Discharge status: Home / Self Care | Payer: PPO | Source: Ambulatory Visit | Attending: Pulmonary Disease | Admitting: Pulmonary Disease

## 2017-07-13 DIAGNOSIS — Z1231 Encounter for screening mammogram for malignant neoplasm of breast: Secondary | ICD-10-CM

## 2017-07-19 DIAGNOSIS — I119 Hypertensive heart disease without heart failure: Secondary | ICD-10-CM | POA: Diagnosis not present

## 2017-07-19 DIAGNOSIS — G309 Alzheimer's disease, unspecified: Secondary | ICD-10-CM | POA: Diagnosis not present

## 2017-07-19 DIAGNOSIS — M159 Polyosteoarthritis, unspecified: Secondary | ICD-10-CM | POA: Diagnosis not present

## 2017-07-19 DIAGNOSIS — E559 Vitamin D deficiency, unspecified: Secondary | ICD-10-CM | POA: Diagnosis not present

## 2017-07-19 DIAGNOSIS — N183 Chronic kidney disease, stage 3 (moderate): Secondary | ICD-10-CM | POA: Diagnosis not present

## 2017-07-19 DIAGNOSIS — R569 Unspecified convulsions: Secondary | ICD-10-CM | POA: Diagnosis not present

## 2017-07-19 DIAGNOSIS — Z6823 Body mass index (BMI) 23.0-23.9, adult: Secondary | ICD-10-CM | POA: Diagnosis not present

## 2017-07-19 DIAGNOSIS — Z79899 Other long term (current) drug therapy: Secondary | ICD-10-CM | POA: Diagnosis not present

## 2017-07-19 DIAGNOSIS — M2550 Pain in unspecified joint: Secondary | ICD-10-CM | POA: Diagnosis not present

## 2017-07-19 DIAGNOSIS — E7800 Pure hypercholesterolemia, unspecified: Secondary | ICD-10-CM | POA: Diagnosis not present

## 2017-12-06 ENCOUNTER — Ambulatory Visit: Payer: PPO | Admitting: Nurse Practitioner

## 2017-12-11 ENCOUNTER — Other Ambulatory Visit: Payer: Self-pay | Admitting: Neurology
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
## 2017-12-12 ENCOUNTER — Ambulatory Visit: Payer: PPO | Admitting: Nurse Practitioner

## 2017-12-13 ENCOUNTER — Encounter: Payer: Self-pay | Admitting: Nurse Practitioner

## 2017-12-14 ENCOUNTER — Telehealth: Payer: Self-pay | Admitting: Neurology

## 2017-12-14 NOTE — Telephone Encounter (Signed)
I have called in namenda 10mg  bid x60 with 6 refills.
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
## 2017-12-21 ENCOUNTER — Other Ambulatory Visit: Payer: Self-pay | Admitting: Neurology

## 2017-12-24 ENCOUNTER — Telehealth: Payer: Self-pay | Admitting: Nurse Practitioner

## 2017-12-24 ENCOUNTER — Encounter: Payer: Self-pay | Admitting: Nurse Practitioner

## 2017-12-24 ENCOUNTER — Ambulatory Visit (INDEPENDENT_AMBULATORY_CARE_PROVIDER_SITE_OTHER): Payer: PPO | Admitting: Nurse Practitioner

## 2017-12-24 VITALS — BP 166/96 | HR 46 | Ht 71.0 in | Wt 175.2 lb

## 2017-12-24 DIAGNOSIS — F0390 Unspecified dementia without behavioral disturbance: Secondary | ICD-10-CM

## 2017-12-24 DIAGNOSIS — R569 Unspecified convulsions: Secondary | ICD-10-CM | POA: Diagnosis not present

## 2017-12-24 MED ORDER — DONEPEZIL HCL 10 MG PO TABS: 10.0000 mg | ORAL_TABLET | Freq: Every day | ORAL | 11 refills | Status: DC

## 2017-12-24 MED ORDER — LAMOTRIGINE 100 MG PO TABS: 100.0000 mg | ORAL_TABLET | Freq: Two times a day (BID) | ORAL | 3 refills | Status: DC

## 2017-12-24 MED ORDER — MEMANTINE HCL 10 MG PO TABS: 10.0000 mg | ORAL_TABLET | Freq: Two times a day (BID) | ORAL | 11 refills | Status: DC

## 2017-12-24 NOTE — Telephone Encounter (Signed)
Patient needs 1 year f/u w/ Dr. Krista Blue per Hoyle Sauer, NP. Patient states she will call back to make this appt.

## 2017-12-24 NOTE — Patient Instructions (Signed)
Continue Lamictal at current dose for seizure activity Continue Aricept and Namenda for memory Exercise by walking  Word search crossword etc. Follow-up with primary care regarding blood pressure, make sure you are taking a blood pressure medication Follow-up yearly.

## 2018-01-01 NOTE — Progress Notes (Signed)
I have reviewed and agreed above plan. 

## 2018-01-03 ENCOUNTER — Encounter (HOSPITAL_COMMUNITY): Payer: Self-pay | Admitting: Emergency Medicine

## 2018-01-03 ENCOUNTER — Emergency Department (HOSPITAL_COMMUNITY)
Admission: EM | Admit: 2018-01-03 | Discharge: 2018-01-03 | Disposition: A | Discharge status: Home / Self Care | Payer: PPO | Attending: Emergency Medicine | Admitting: Emergency Medicine

## 2018-01-03 ENCOUNTER — Other Ambulatory Visit: Payer: Self-pay

## 2018-01-03 DIAGNOSIS — I10 Essential (primary) hypertension: Secondary | ICD-10-CM

## 2018-01-03 DIAGNOSIS — F0390 Unspecified dementia without behavioral disturbance: Secondary | ICD-10-CM | POA: Diagnosis not present

## 2018-01-03 DIAGNOSIS — Z7982 Long term (current) use of aspirin: Secondary | ICD-10-CM | POA: Diagnosis not present

## 2018-01-03 DIAGNOSIS — Z79899 Other long term (current) drug therapy: Secondary | ICD-10-CM | POA: Diagnosis not present

## 2018-01-03 NOTE — Discharge Instructions (Addendum)
You are seen in the emergency department today for elevated blood pressure.  Your blood pressure is high here in the emergency department.  Please continue to log your blood pressures at home and take your medications as prescribed.  We would like you to follow-up closely with your primary care provider within the next 3 to 5 days for a recheck of your blood pressure and likely change in your blood pressure medication management regimen.  Return to the ER for new or worsening symptoms including but not limited to headache, change in vision, numbness, weakness, chest pain, trouble breathing, or any other concerns that you may have.

## 2018-01-03 NOTE — ED Triage Notes (Signed)
Pt reports that her BP is higher today than normal. Pt took BP medicines today already.

## 2018-01-03 NOTE — ED Provider Notes (Signed)
[facility] EMERGENCY DEPARTMENT Provider Note   CSN: [id number] Arrival date & time: 01/03/18  1550     History   Chief Complaint Chief Complaint  Patient presents with  . Hypertension    HPI Deborah Webb is a 67 y.o. female with a hx of HTN, hyperlipidemia, and seizures who presents to the ED due to concern for elevated BP for the past 2 weeks.  Patient states she has a long-standing history of hypertension she reports that she takes losartan as well as triamterene-hydrochlorothiazide combination pill.  States she has been compliant with her medications.  She relates that her blood pressure has been elevated in the 222L to 798X systolic for the past 2 weeks.  States her checks her blood pressure daily.  She relates that she called her primary care provider who could not see her until early next week therefore she came to the ER.  She is completely asymptomatic.  She denies any headache, change in vision, numbness, weakness, paresthesias, chest pain, or difficulty breathing.  She has no specific alleviating or aggravating factors to her current condition.  She states she has not had recent blood pressure medication changes.  HPI  Past Medical History:  Diagnosis Date  . Hyperlipidemia   . Hypertension   . Seizures [facility])     Patient Active Problem List   Diagnosis Date Noted  . Partial seizure ([place]) 12/05/2016  . Dilantin toxicity 08/19/2016  . Hypokalemia 08/19/2016  . Dementia ([place]) 08/19/2016  . Aneurysm of ascending aorta ([place]) 08/19/2016    Past Surgical History:  Procedure Laterality Date  . BRAIN SURGERY  1968  . BREAST CYST ASPIRATION Right      OB History   None      Home Medications    Prior to Admission medications   Medication Sig Start Date End Date Taking? Authorizing Provider  aspirin EC 81 MG tablet Take 81 mg by mouth daily.    [provider]  atorvastatin (LIPITOR) 10 MG tablet Take 10 mg by mouth every  morning.     [provider]  donepezil (ARICEPT) 10 MG tablet Take 1 tablet (10 mg total) by mouth at bedtime. 12/24/17   Dennie Bible, NP  lamoTRIgine (LAMICTAL) 100 MG tablet Take 1 tablet (100 mg total) by mouth 2 (two) times daily. 12/24/17   Dennie Bible, NP  losartan (COZAAR) 50 MG tablet Take 50 mg by mouth daily.    [provider]  memantine (NAMENDA) 10 MG tablet Take 1 tablet (10 mg total) by mouth 2 (two) times daily. 12/24/17   Dennie Bible, NP  Multiple Vitamins-Minerals (MULTIVITAMIN PO) Take 1 tablet by mouth every Monday.     [provider]  triamterene-hydrochlorothiazide (MAXZIDE-25) 37.5-25 MG per tablet Take 1 tablet by mouth daily.    [provider]    Family History Family History  Problem Relation Age of Onset  . Breast cancer Mother   . Other Father        unsure of medical history    Social History Social History   Tobacco Use  . Smoking status: Never Smoker  . Smokeless tobacco: Never Used  Substance Use Topics  . Alcohol use: No  . Drug use: No     Allergies   Patient has no known allergies.   Review of Systems Review of Systems  Eyes: Negative for visual disturbance.  Respiratory: Negative for shortness of breath.   Cardiovascular: Negative for  chest pain.  Gastrointestinal: Negative for nausea and vomiting.  Neurological: Negative for dizziness, seizures, syncope, facial asymmetry, speech difficulty, weakness, light-headedness, numbness and headaches.     Physical Exam Updated Vital Signs BP (!) 178/98 (BP Location: Left Arm)   Pulse 65   Temp 97.8 F (36.6 C) (Oral)   Resp 16   SpO2 100%   Physical Exam  Constitutional: She appears well-developed and well-nourished.  Non-toxic appearance. No distress.  HENT:  Head: Normocephalic and atraumatic.  Eyes: Pupils are equal, round, and reactive to light. Conjunctivae and EOM are normal. Right eye exhibits no discharge.  Left eye exhibits no discharge.  Neck: Neck supple.  Cardiovascular: Normal rate and regular rhythm.  Pulmonary/Chest: Effort normal and breath sounds normal. No respiratory distress. She has no wheezes. She has no rhonchi. She has no rales.  Respiration even and unlabored  Abdominal: Soft. She exhibits no distension. There is no tenderness.  Neurological: She is alert.  Clear speech.  CN III through XII grossly intact.  Sensation grossly intact bilateral upper and lower extremities.  5 out of 5 symmetric grip strength.  5 out of 5 strength plantar dorsiflexion bilaterally.  Ambulatory.  Negative pronator drift.  Normal finger-nose.  Skin: Skin is warm and dry. No rash noted.  Psychiatric: She has a normal mood and affect. Her behavior is normal.  Nursing note and vitals reviewed.   ED Treatments / Results  Labs (all labs ordered are listed, but only abnormal results are displayed) Labs Reviewed - No data to display  EKG None  Radiology No results found.  Procedures Procedures (including critical care time)  Medications Ordered in ED Medications - No data to display   Initial Impression / Assessment and Plan / ED Course  I have reviewed the triage vital signs and the nursing notes.  Pertinent labs & imaging results that were available during my care of the patient were reviewed by me and considered in my medical decision making (see chart for details).   Patient presents to the emergency department due to concern for elevated blood pressure readings at home.  In the emergency department today she is hypertensive, however upon chart review it appears that her blood pressure has been consistently elevated similarly over the past 1 year at ER/office visits.  She is completely asymptomatic without chest pain, dyspnea, headache, change in vision, numbness, weakness, paresthesias, dizziness, or any other complaints.  She has a benign physical exam.  I do not suspect hypertensive  emergency.  Will discharge her with recommendations for continued med compliance and blood pressure logging with close PCP follow-up next week for likely adjustment of her blood pressure medications.  I discussed  plan, need for PCP follow-up, and return precautions with the patient and her husband at bedside. Provided opportunity for questions, patient and her husband confirmed understanding and are in agreement with plan.   Findings and plan of care discussed with supervising physician Dr. Sherry Ruffing- in agreement.   Final Clinical Impressions(s) / ED Diagnoses   Final diagnoses:  Hypertension, unspecified type    ED Discharge Orders    None       Amaryllis Dyke, PA-C 01/03/18 1845    Tegeler, Gwenyth Allegra, MD 01/04/18 [phone]

## 2018-01-08 DIAGNOSIS — M159 Polyosteoarthritis, unspecified: Secondary | ICD-10-CM | POA: Diagnosis not present

## 2018-01-08 DIAGNOSIS — E7800 Pure hypercholesterolemia, unspecified: Secondary | ICD-10-CM | POA: Diagnosis not present

## 2018-01-08 DIAGNOSIS — G309 Alzheimer's disease, unspecified: Secondary | ICD-10-CM | POA: Diagnosis not present

## 2018-01-08 DIAGNOSIS — N183 Chronic kidney disease, stage 3 (moderate): Secondary | ICD-10-CM | POA: Diagnosis not present

## 2018-01-08 DIAGNOSIS — Z79899 Other long term (current) drug therapy: Secondary | ICD-10-CM | POA: Diagnosis not present

## 2018-01-08 DIAGNOSIS — R569 Unspecified convulsions: Secondary | ICD-10-CM | POA: Diagnosis not present

## 2018-01-08 DIAGNOSIS — E559 Vitamin D deficiency, unspecified: Secondary | ICD-10-CM | POA: Diagnosis not present

## 2018-01-08 DIAGNOSIS — I119 Hypertensive heart disease without heart failure: Secondary | ICD-10-CM | POA: Diagnosis not present

## 2018-01-08 DIAGNOSIS — G40909 Epilepsy, unspecified, not intractable, without status epilepticus: Secondary | ICD-10-CM | POA: Diagnosis not present

## 2018-01-08 DIAGNOSIS — M2550 Pain in unspecified joint: Secondary | ICD-10-CM | POA: Diagnosis not present

## 2018-01-31 DIAGNOSIS — Z1211 Encounter for screening for malignant neoplasm of colon: Secondary | ICD-10-CM | POA: Diagnosis not present

## 2018-01-31 DIAGNOSIS — K5900 Constipation, unspecified: Secondary | ICD-10-CM | POA: Diagnosis not present

## 2018-01-31 DIAGNOSIS — R635 Abnormal weight gain: Secondary | ICD-10-CM | POA: Diagnosis not present

## 2018-04-03 DIAGNOSIS — K621 Rectal polyp: Secondary | ICD-10-CM | POA: Diagnosis not present

## 2018-04-03 DIAGNOSIS — D122 Benign neoplasm of ascending colon: Secondary | ICD-10-CM | POA: Diagnosis not present

## 2018-04-03 DIAGNOSIS — K635 Polyp of colon: Secondary | ICD-10-CM | POA: Diagnosis not present

## 2018-04-03 DIAGNOSIS — D128 Benign neoplasm of rectum: Secondary | ICD-10-CM | POA: Diagnosis not present

## 2018-04-03 DIAGNOSIS — Z1211 Encounter for screening for malignant neoplasm of colon: Secondary | ICD-10-CM | POA: Diagnosis not present

## 2018-04-03 DIAGNOSIS — D123 Benign neoplasm of transverse colon: Secondary | ICD-10-CM | POA: Diagnosis not present

## 2018-04-03 DIAGNOSIS — Z86010 Personal history of colonic polyps: Secondary | ICD-10-CM | POA: Diagnosis not present

## 2018-07-04 DIAGNOSIS — E559 Vitamin D deficiency, unspecified: Secondary | ICD-10-CM | POA: Diagnosis not present

## 2018-07-04 DIAGNOSIS — M159 Polyosteoarthritis, unspecified: Secondary | ICD-10-CM | POA: Diagnosis not present

## 2018-07-04 DIAGNOSIS — G309 Alzheimer's disease, unspecified: Secondary | ICD-10-CM | POA: Diagnosis not present

## 2018-07-04 DIAGNOSIS — E7800 Pure hypercholesterolemia, unspecified: Secondary | ICD-10-CM | POA: Diagnosis not present

## 2018-07-04 DIAGNOSIS — I119 Hypertensive heart disease without heart failure: Secondary | ICD-10-CM | POA: Diagnosis not present

## 2018-07-04 DIAGNOSIS — N183 Chronic kidney disease, stage 3 (moderate): Secondary | ICD-10-CM | POA: Diagnosis not present

## 2018-07-04 DIAGNOSIS — Z0001 Encounter for general adult medical examination with abnormal findings: Secondary | ICD-10-CM | POA: Diagnosis not present

## 2018-07-04 DIAGNOSIS — R569 Unspecified convulsions: Secondary | ICD-10-CM | POA: Diagnosis not present

## 2018-07-04 DIAGNOSIS — Z79899 Other long term (current) drug therapy: Secondary | ICD-10-CM | POA: Diagnosis not present

## 2018-07-04 DIAGNOSIS — M2550 Pain in unspecified joint: Secondary | ICD-10-CM | POA: Diagnosis not present

## 2018-07-04 DIAGNOSIS — G40909 Epilepsy, unspecified, not intractable, without status epilepticus: Secondary | ICD-10-CM | POA: Diagnosis not present

## 2018-10-10 DIAGNOSIS — E559 Vitamin D deficiency, unspecified: Secondary | ICD-10-CM | POA: Diagnosis not present

## 2018-10-10 DIAGNOSIS — I119 Hypertensive heart disease without heart failure: Secondary | ICD-10-CM | POA: Diagnosis not present

## 2018-10-10 DIAGNOSIS — R569 Unspecified convulsions: Secondary | ICD-10-CM | POA: Diagnosis not present

## 2018-10-10 DIAGNOSIS — N183 Chronic kidney disease, stage 3 (moderate): Secondary | ICD-10-CM | POA: Diagnosis not present

## 2018-10-10 DIAGNOSIS — E7800 Pure hypercholesterolemia, unspecified: Secondary | ICD-10-CM | POA: Diagnosis not present

## 2018-10-10 DIAGNOSIS — G309 Alzheimer's disease, unspecified: Secondary | ICD-10-CM | POA: Diagnosis not present

## 2018-10-10 DIAGNOSIS — Z79899 Other long term (current) drug therapy: Secondary | ICD-10-CM | POA: Diagnosis not present

## 2018-10-10 DIAGNOSIS — M2550 Pain in unspecified joint: Secondary | ICD-10-CM | POA: Diagnosis not present

## 2018-10-10 DIAGNOSIS — M159 Polyosteoarthritis, unspecified: Secondary | ICD-10-CM | POA: Diagnosis not present
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
## 2018-12-30 ENCOUNTER — Other Ambulatory Visit: Payer: Self-pay

## 2018-12-30 ENCOUNTER — Ambulatory Visit (INDEPENDENT_AMBULATORY_CARE_PROVIDER_SITE_OTHER): Payer: PPO | Admitting: Neurology

## 2018-12-30 ENCOUNTER — Encounter: Payer: Self-pay | Admitting: Neurology

## 2018-12-30 VITALS — BP 157/95 | HR 51 | Temp 97.5°F | Ht 71.0 in | Wt 188.0 lb

## 2018-12-30 DIAGNOSIS — R569 Unspecified convulsions: Secondary | ICD-10-CM

## 2018-12-30 DIAGNOSIS — F0390 Unspecified dementia without behavioral disturbance: Secondary | ICD-10-CM | POA: Diagnosis not present

## 2018-12-30 MED ORDER — LAMOTRIGINE 100 MG PO TABS: 100.0000 mg | ORAL_TABLET | Freq: Two times a day (BID) | ORAL | 3 refills | Status: DC

## 2018-12-30 MED ORDER — MEMANTINE HCL 10 MG PO TABS: 10.0000 mg | ORAL_TABLET | Freq: Two times a day (BID) | ORAL | 11 refills | Status: DC

## 2018-12-30 MED ORDER — DONEPEZIL HCL 10 MG PO TABS: 10.0000 mg | ORAL_TABLET | Freq: Every day | ORAL | 11 refills | Status: DC

## 2018-12-30 NOTE — Progress Notes (Signed)
I have reviewed and agreed above plan. 

## 2018-12-30 NOTE — Patient Instructions (Signed)
1. Continue current medications 2. Memory score appears stable 17/30 3. I will check routine lab 4. Encourage exercise, drink plenty of water 5. Keep a check on BP, discuss with PCP 6. Return in 1 year

## 2018-12-31 LAB — COMPREHENSIVE METABOLIC PANEL
ALT: 17 IU/L (ref 0–32)
AST: 18 IU/L (ref 0–40)
Albumin/Globulin Ratio: 1.6 (ref 1.2–2.2)
Albumin: 4.4 g/dL (ref 3.8–4.8)
Alkaline Phosphatase: 67 IU/L (ref 39–117)
BUN/Creatinine Ratio: 15 (ref 12–28)
BUN: 27 mg/dL (ref 8–27)
Bilirubin Total: 0.2 mg/dL (ref 0.0–1.2)
CO2: 27 mmol/L (ref 20–29)
Calcium: 9.5 mg/dL (ref 8.7–10.3)
Chloride: 102 mmol/L (ref 96–106)
Creatinine, Ser: 1.75 mg/dL — ABNORMAL HIGH (ref 0.57–1.00)
GFR calc Af Amer: 34 mL/min/{1.73_m2} — ABNORMAL LOW (ref >59)
GFR calc non Af Amer: 29 mL/min/{1.73_m2} — ABNORMAL LOW (ref >59)
Globulin, Total: 2.8 g/dL (ref 1.5–4.5)
Glucose: 82 mg/dL (ref 65–99)
Potassium: 3.7 mmol/L (ref 3.5–5.2)
Sodium: 142 mmol/L (ref 134–144)
Total Protein: 7.2 g/dL (ref 6.0–8.5)

## 2018-12-31 LAB — CBC WITH DIFFERENTIAL/PLATELET
Basophils Absolute: 0 10*3/uL (ref 0.0–0.2)
Basos: 1 %
EOS (ABSOLUTE): 0.1 10*3/uL (ref 0.0–0.4)
Eos: 2 %
Hematocrit: 37 % (ref 34.0–46.6)
Hemoglobin: 12.6 g/dL (ref 11.1–15.9)
Immature Grans (Abs): 0 10*3/uL (ref 0.0–0.1)
Immature Granulocytes: 0 %
Lymphocytes Absolute: 1.7 10*3/uL (ref 0.7–3.1)
Lymphs: 30 %
MCH: 31.5 pg (ref 26.6–33.0)
MCHC: 34.1 g/dL (ref 31.5–35.7)
MCV: 93 fL (ref 79–97)
Monocytes Absolute: 0.7 10*3/uL (ref 0.1–0.9)
Monocytes: 12 %
Neutrophils Absolute: 3.3 10*3/uL (ref 1.4–7.0)
Neutrophils: 55 %
Platelets: 166 10*3/uL (ref 150–450)
RBC: 4 x10E6/uL (ref 3.77–5.28)
RDW: 12.5 % (ref 11.7–15.4)
WBC: 5.8 10*3/uL (ref 3.4–10.8)

## 2018-12-31 LAB — LAMOTRIGINE LEVEL: Lamotrigine Lvl: 15.1 ug/mL (ref 2.0–20.0)

## 2019-01-02 ENCOUNTER — Telehealth: Payer: Self-pay | Admitting: *Deleted

## 2019-01-02 NOTE — Telephone Encounter (Signed)
Spoke with husband, [name] on [place] and informed him the patient's  kidney function has declined since last checked which was 2 years ago. I noted they have routine care with her PCP and he acknowledged. I advised her Lamictal level is normal, but on higher end of normal especially with Lamictal 100 mg BID. I stated the NP talked with Dr. Krista Blue, and they will not alter dosing at this point, since she is doing okay, no seizures, no signs of toxicity. I reviewed signs of elevated Lamictal level including, drowsiness, gait changes, slurred speech, dizziness. I advised him I'll  send a copy of lab work to PCP, to ensure there has been no change on their end. He  verbalized understanding, appreciation. Lab results faxed to DR Bernadene Bell, PCP.

## 2019-01-20 DIAGNOSIS — M2550 Pain in unspecified joint: Secondary | ICD-10-CM | POA: Diagnosis not present

## 2019-01-20 DIAGNOSIS — I119 Hypertensive heart disease without heart failure: Secondary | ICD-10-CM | POA: Diagnosis not present

## 2019-01-20 DIAGNOSIS — G309 Alzheimer's disease, unspecified: Secondary | ICD-10-CM | POA: Diagnosis not present

## 2019-01-20 DIAGNOSIS — E559 Vitamin D deficiency, unspecified: Secondary | ICD-10-CM | POA: Diagnosis not present

## 2019-01-20 DIAGNOSIS — R569 Unspecified convulsions: Secondary | ICD-10-CM | POA: Diagnosis not present

## 2019-01-20 DIAGNOSIS — M159 Polyosteoarthritis, unspecified: Secondary | ICD-10-CM | POA: Diagnosis not present

## 2019-01-20 DIAGNOSIS — Z79899 Other long term (current) drug therapy: Secondary | ICD-10-CM | POA: Diagnosis not present

## 2019-01-20 DIAGNOSIS — N183 Chronic kidney disease, stage 3 unspecified: Secondary | ICD-10-CM | POA: Diagnosis not present

## 2019-01-20 DIAGNOSIS — E7800 Pure hypercholesterolemia, unspecified: Secondary | ICD-10-CM | POA: Diagnosis not present

## 2019-04-17 DIAGNOSIS — E7800 Pure hypercholesterolemia, unspecified: Secondary | ICD-10-CM | POA: Diagnosis not present

## 2019-04-17 DIAGNOSIS — M2550 Pain in unspecified joint: Secondary | ICD-10-CM | POA: Diagnosis not present

## 2019-04-17 DIAGNOSIS — Z79899 Other long term (current) drug therapy: Secondary | ICD-10-CM | POA: Diagnosis not present

## 2019-04-17 DIAGNOSIS — G309 Alzheimer's disease, unspecified: Secondary | ICD-10-CM | POA: Diagnosis not present

## 2019-04-17 DIAGNOSIS — E559 Vitamin D deficiency, unspecified: Secondary | ICD-10-CM | POA: Diagnosis not present

## 2019-04-17 DIAGNOSIS — I119 Hypertensive heart disease without heart failure: Secondary | ICD-10-CM | POA: Diagnosis not present

## 2019-04-17 DIAGNOSIS — M159 Polyosteoarthritis, unspecified: Secondary | ICD-10-CM | POA: Diagnosis not present

## 2019-04-17 DIAGNOSIS — N183 Chronic kidney disease, stage 3 unspecified: Secondary | ICD-10-CM | POA: Diagnosis not present

## 2019-04-17 DIAGNOSIS — R569 Unspecified convulsions: Secondary | ICD-10-CM | POA: Diagnosis not present

## 2019-05-03 ENCOUNTER — Ambulatory Visit: Payer: PPO | Attending: Internal Medicine

## 2019-05-03 DIAGNOSIS — Z23 Encounter for immunization: Secondary | ICD-10-CM

## 2019-05-03 NOTE — Progress Notes (Signed)
   Covid-19 Vaccination Clinic  Name:  Deborah Webb    MRN: [id number] DOB: [birth year]  05/03/2019  [name] was observed post Covid-19 immunization for 15 minutes without incidence. She was provided with Vaccine Information Sheet and instruction to access the V-Safe system.   [name] was instructed to call 911 with any severe reactions post vaccine: Marland Kitchen Difficulty breathing  . Swelling of your face and throat  . A fast heartbeat  . A bad rash all over your body  . Dizziness and weakness    Immunizations Administered    Name Date Dose VIS Date Route   Pfizer COVID-19 Vaccine 05/03/2019 10:03 AM 0.3 mL 02/21/2019 Intramuscular   Manufacturer: [place]   Lot: [id number]   [place]: [id number]

## 2019-05-27 ENCOUNTER — Ambulatory Visit: Payer: PPO | Attending: Internal Medicine

## 2019-05-27 DIAGNOSIS — Z23 Encounter for immunization: Secondary | ICD-10-CM

## 2019-05-27 NOTE — Progress Notes (Signed)
   Covid-19 Vaccination Clinic  Name:  KAMORIA LUCIEN    MRN: [id number] DOB: [birth year]  05/27/2019  Ms. Cullen was observed post Covid-19 immunization for 15 minutes without incident. She was provided with Vaccine Information Sheet and instruction to access the V-Safe system.   Ms. Regner was instructed to call 911 with any severe reactions post vaccine: Marland Kitchen Difficulty breathing  . Swelling of face and throat  . A fast heartbeat  . A bad rash all over body  . Dizziness and weakness   Immunizations Administered    Name Date Dose VIS Date Route   Pfizer COVID-19 Vaccine 05/27/2019 10:05 AM 0.3 mL 02/21/2019 Intramuscular   Manufacturer: [place]   Lot: [id number]   [place]: [id number]

## 2019-07-01 ENCOUNTER — Other Ambulatory Visit: Payer: Self-pay | Admitting: Pulmonary Disease

## 2019-07-01 DIAGNOSIS — Z1231 Encounter for screening mammogram for malignant neoplasm of breast: Secondary | ICD-10-CM

## 2019-07-08 ENCOUNTER — Ambulatory Visit
Admission: RE | Admit: 2019-07-08 | Discharge: 2019-07-08 | Disposition: A | Discharge status: Home / Self Care | Payer: PPO | Source: Ambulatory Visit | Attending: Pulmonary Disease | Admitting: Pulmonary Disease

## 2019-07-08 ENCOUNTER — Other Ambulatory Visit: Payer: Self-pay

## 2019-07-08 DIAGNOSIS — Z1231 Encounter for screening mammogram for malignant neoplasm of breast: Secondary | ICD-10-CM | POA: Diagnosis not present

## 2019-07-29 DIAGNOSIS — M2550 Pain in unspecified joint: Secondary | ICD-10-CM | POA: Diagnosis not present

## 2019-07-29 DIAGNOSIS — N1832 Chronic kidney disease, stage 3b: Secondary | ICD-10-CM | POA: Diagnosis not present

## 2019-07-29 DIAGNOSIS — E559 Vitamin D deficiency, unspecified: Secondary | ICD-10-CM | POA: Diagnosis not present

## 2019-07-29 DIAGNOSIS — M159 Polyosteoarthritis, unspecified: Secondary | ICD-10-CM | POA: Diagnosis not present

## 2019-07-29 DIAGNOSIS — Z79899 Other long term (current) drug therapy: Secondary | ICD-10-CM | POA: Diagnosis not present

## 2019-07-29 DIAGNOSIS — I119 Hypertensive heart disease without heart failure: Secondary | ICD-10-CM | POA: Diagnosis not present

## 2019-07-29 DIAGNOSIS — E7800 Pure hypercholesterolemia, unspecified: Secondary | ICD-10-CM | POA: Diagnosis not present

## 2019-07-29 DIAGNOSIS — R569 Unspecified convulsions: Secondary | ICD-10-CM | POA: Diagnosis not present

## 2019-07-29 DIAGNOSIS — G309 Alzheimer's disease, unspecified: Secondary | ICD-10-CM | POA: Diagnosis not present

## 2019-12-29 DIAGNOSIS — Z79899 Other long term (current) drug therapy: Secondary | ICD-10-CM | POA: Diagnosis not present

## 2019-12-29 DIAGNOSIS — G40909 Epilepsy, unspecified, not intractable, without status epilepticus: Secondary | ICD-10-CM | POA: Diagnosis not present

## 2019-12-29 DIAGNOSIS — I119 Hypertensive heart disease without heart failure: Secondary | ICD-10-CM | POA: Diagnosis not present

## 2019-12-29 DIAGNOSIS — M2550 Pain in unspecified joint: Secondary | ICD-10-CM | POA: Diagnosis not present

## 2019-12-29 DIAGNOSIS — N1832 Chronic kidney disease, stage 3b: Secondary | ICD-10-CM | POA: Diagnosis not present

## 2019-12-29 DIAGNOSIS — E7800 Pure hypercholesterolemia, unspecified: Secondary | ICD-10-CM | POA: Diagnosis not present

## 2019-12-29 DIAGNOSIS — G309 Alzheimer's disease, unspecified: Secondary | ICD-10-CM | POA: Diagnosis not present

## 2019-12-29 DIAGNOSIS — E559 Vitamin D deficiency, unspecified: Secondary | ICD-10-CM | POA: Diagnosis not present

## 2019-12-29 DIAGNOSIS — Z0001 Encounter for general adult medical examination with abnormal findings: Secondary | ICD-10-CM | POA: Diagnosis not present

## 2019-12-29 DIAGNOSIS — M159 Polyosteoarthritis, unspecified: Secondary | ICD-10-CM | POA: Diagnosis not present

## 2019-12-31 ENCOUNTER — Other Ambulatory Visit: Payer: Self-pay

## 2019-12-31 ENCOUNTER — Encounter: Payer: Self-pay | Admitting: Neurology

## 2019-12-31 ENCOUNTER — Ambulatory Visit: Payer: PPO | Admitting: Neurology

## 2019-12-31 VITALS — BP 155/87 | HR 56 | Ht 71.0 in | Wt 177.6 lb

## 2019-12-31 DIAGNOSIS — R569 Unspecified convulsions: Secondary | ICD-10-CM | POA: Diagnosis not present

## 2019-12-31 DIAGNOSIS — F0390 Unspecified dementia without behavioral disturbance: Secondary | ICD-10-CM

## 2019-12-31 MED ORDER — DONEPEZIL HCL 10 MG PO TABS: 10.0000 mg | ORAL_TABLET | Freq: Every day | ORAL | 4 refills | Status: DC

## 2019-12-31 MED ORDER — LAMOTRIGINE 100 MG PO TABS: 100.0000 mg | ORAL_TABLET | Freq: Two times a day (BID) | ORAL | 3 refills | Status: DC

## 2019-12-31 MED ORDER — MEMANTINE HCL 10 MG PO TABS: 10.0000 mg | ORAL_TABLET | Freq: Two times a day (BID) | ORAL | 4 refills | Status: DC
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
## 2019-12-31 NOTE — Patient Instructions (Signed)
Continue current medications Make sure drinking enough water, try to do more reading and puzzles Start walking, doing some exercise  See you back in 1 year

## 2020-04-27 DIAGNOSIS — I119 Hypertensive heart disease without heart failure: Secondary | ICD-10-CM | POA: Diagnosis not present

## 2020-04-27 DIAGNOSIS — G309 Alzheimer's disease, unspecified: Secondary | ICD-10-CM | POA: Diagnosis not present

## 2020-04-27 DIAGNOSIS — M159 Polyosteoarthritis, unspecified: Secondary | ICD-10-CM | POA: Diagnosis not present

## 2020-04-27 DIAGNOSIS — N1832 Chronic kidney disease, stage 3b: Secondary | ICD-10-CM | POA: Diagnosis not present

## 2020-04-27 DIAGNOSIS — Z79899 Other long term (current) drug therapy: Secondary | ICD-10-CM | POA: Diagnosis not present

## 2020-04-27 DIAGNOSIS — R569 Unspecified convulsions: Secondary | ICD-10-CM | POA: Diagnosis not present

## 2020-04-27 DIAGNOSIS — E569 Vitamin deficiency, unspecified: Secondary | ICD-10-CM | POA: Diagnosis not present

## 2020-04-27 DIAGNOSIS — E7800 Pure hypercholesterolemia, unspecified: Secondary | ICD-10-CM | POA: Diagnosis not present

## 2020-04-27 DIAGNOSIS — M2550 Pain in unspecified joint: Secondary | ICD-10-CM | POA: Diagnosis not present

## 2020-05-31 ENCOUNTER — Other Ambulatory Visit: Payer: Self-pay | Admitting: Pulmonary Disease

## 2020-05-31 DIAGNOSIS — Z0000 Encounter for general adult medical examination without abnormal findings: Secondary | ICD-10-CM

## 2020-07-20 ENCOUNTER — Ambulatory Visit: Payer: PPO | Attending: Internal Medicine

## 2020-07-20 DIAGNOSIS — Z20822 Contact with and (suspected) exposure to covid-19: Secondary | ICD-10-CM

## 2020-07-21 LAB — NOVEL CORONAVIRUS, NAA: SARS-CoV-2, NAA: NOT DETECTED

## 2020-07-21 LAB — SARS-COV-2, NAA 2 DAY TAT

## 2020-07-22 ENCOUNTER — Other Ambulatory Visit: Payer: Self-pay

## 2020-07-22 ENCOUNTER — Ambulatory Visit
Admission: RE | Admit: 2020-07-22 | Discharge: 2020-07-22 | Disposition: A | Discharge status: Home / Self Care | Payer: PPO | Source: Ambulatory Visit | Attending: Pulmonary Disease | Admitting: Pulmonary Disease

## 2020-07-22 DIAGNOSIS — Z0000 Encounter for general adult medical examination without abnormal findings: Secondary | ICD-10-CM

## 2020-07-22 DIAGNOSIS — Z1231 Encounter for screening mammogram for malignant neoplasm of breast: Secondary | ICD-10-CM | POA: Diagnosis not present

## 2020-07-23 ENCOUNTER — Other Ambulatory Visit: Payer: Self-pay | Admitting: Pulmonary Disease

## 2020-07-23 DIAGNOSIS — R928 Other abnormal and inconclusive findings on diagnostic imaging of breast: Secondary | ICD-10-CM

## 2020-08-06 ENCOUNTER — Ambulatory Visit
Admission: RE | Admit: 2020-08-06 | Discharge: 2020-08-06 | Disposition: A | Discharge status: Home / Self Care | Payer: PPO | Source: Ambulatory Visit | Attending: Pulmonary Disease | Admitting: Pulmonary Disease

## 2020-08-06 ENCOUNTER — Other Ambulatory Visit: Payer: Self-pay | Admitting: Pulmonary Disease

## 2020-08-06 ENCOUNTER — Other Ambulatory Visit: Payer: Self-pay

## 2020-08-06 DIAGNOSIS — R928 Other abnormal and inconclusive findings on diagnostic imaging of breast: Secondary | ICD-10-CM | POA: Diagnosis not present

## 2020-08-06 DIAGNOSIS — R922 Inconclusive mammogram: Secondary | ICD-10-CM | POA: Diagnosis not present

## 2020-09-21 DIAGNOSIS — E559 Vitamin D deficiency, unspecified: Secondary | ICD-10-CM | POA: Diagnosis not present

## 2020-09-21 DIAGNOSIS — E7800 Pure hypercholesterolemia, unspecified: Secondary | ICD-10-CM | POA: Diagnosis not present

## 2020-09-21 DIAGNOSIS — Z79899 Other long term (current) drug therapy: Secondary | ICD-10-CM | POA: Diagnosis not present

## 2020-09-21 DIAGNOSIS — M159 Polyosteoarthritis, unspecified: Secondary | ICD-10-CM | POA: Diagnosis not present

## 2020-09-21 DIAGNOSIS — G309 Alzheimer's disease, unspecified: Secondary | ICD-10-CM | POA: Diagnosis not present

## 2020-09-21 DIAGNOSIS — N1832 Chronic kidney disease, stage 3b: Secondary | ICD-10-CM | POA: Diagnosis not present

## 2020-09-21 DIAGNOSIS — M2550 Pain in unspecified joint: Secondary | ICD-10-CM | POA: Diagnosis not present

## 2020-09-21 DIAGNOSIS — R569 Unspecified convulsions: Secondary | ICD-10-CM | POA: Diagnosis not present

## 2020-09-21 DIAGNOSIS — I119 Hypertensive heart disease without heart failure: Secondary | ICD-10-CM | POA: Diagnosis not present

## 2020-12-23 ENCOUNTER — Other Ambulatory Visit: Payer: Self-pay | Admitting: *Deleted

## 2020-12-23 MED ORDER — LAMOTRIGINE 100 MG PO TABS: 100.0000 mg | ORAL_TABLET | Freq: Two times a day (BID) | ORAL | 0 refills | Status: DC

## 2020-12-30 ENCOUNTER — Ambulatory Visit: Payer: PPO | Admitting: Neurology

## 2020-12-30 ENCOUNTER — Other Ambulatory Visit: Payer: Self-pay

## 2020-12-30 ENCOUNTER — Encounter: Payer: Self-pay | Admitting: Neurology

## 2020-12-30 VITALS — BP 172/90 | HR 55 | Ht 71.0 in | Wt 180.0 lb

## 2020-12-30 DIAGNOSIS — Z87820 Personal history of traumatic brain injury: Secondary | ICD-10-CM | POA: Diagnosis not present

## 2020-12-30 DIAGNOSIS — R569 Unspecified convulsions: Secondary | ICD-10-CM | POA: Diagnosis not present

## 2020-12-30 DIAGNOSIS — F5104 Psychophysiologic insomnia: Secondary | ICD-10-CM

## 2020-12-30 DIAGNOSIS — F03918 Unspecified dementia, unspecified severity, with other behavioral disturbance: Secondary | ICD-10-CM | POA: Diagnosis not present

## 2020-12-30 MED ORDER — QUETIAPINE FUMARATE 50 MG PO TABS: 50.0000 mg | ORAL_TABLET | Freq: Every day | ORAL | 11 refills | Status: DC

## 2020-12-30 MED ORDER — LAMOTRIGINE 100 MG PO TABS: 100.0000 mg | ORAL_TABLET | Freq: Two times a day (BID) | ORAL | 4 refills | Status: DC
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
## 2021-01-04 ENCOUNTER — Ambulatory Visit: Payer: PPO | Admitting: Neurology

## 2021-01-04 ENCOUNTER — Telehealth: Payer: Self-pay | Admitting: Neurology

## 2021-01-04 DIAGNOSIS — F03918 Unspecified dementia, unspecified severity, with other behavioral disturbance: Secondary | ICD-10-CM

## 2021-01-04 DIAGNOSIS — Z87820 Personal history of traumatic brain injury: Secondary | ICD-10-CM

## 2021-01-04 DIAGNOSIS — R569 Unspecified convulsions: Secondary | ICD-10-CM | POA: Diagnosis not present

## 2021-01-04 LAB — CBC WITH DIFFERENTIAL/PLATELET
Basophils Absolute: 0 x10E3/uL (ref 0.0–0.2)
Basos: 0 %
EOS (ABSOLUTE): 0.1 x10E3/uL (ref 0.0–0.4)
Eos: 2 %
Hematocrit: 35.9 % (ref 34.0–46.6)
Hemoglobin: 12.4 g/dL (ref 11.1–15.9)
Immature Grans (Abs): 0 x10E3/uL (ref 0.0–0.1)
Immature Granulocytes: 0 %
Lymphocytes Absolute: 1.5 x10E3/uL (ref 0.7–3.1)
Lymphs: 29 %
MCH: 32 pg (ref 26.6–33.0)
MCHC: 34.5 g/dL (ref 31.5–35.7)
MCV: 93 fL (ref 79–97)
Monocytes Absolute: 0.5 x10E3/uL (ref 0.1–0.9)
Monocytes: 9 %
Neutrophils Absolute: 3.1 x10E3/uL (ref 1.4–7.0)
Neutrophils: 60 %
Platelets: 197 x10E3/uL (ref 150–450)
RBC: 3.87 x10E6/uL (ref 3.77–5.28)
RDW: 12.8 % (ref 11.7–15.4)
WBC: 5.2 x10E3/uL (ref 3.4–10.8)

## 2021-01-04 LAB — COMPREHENSIVE METABOLIC PANEL
ALT: 8 IU/L (ref 0–32)
AST: 13 IU/L (ref 0–40)
Albumin/Globulin Ratio: 2.1 (ref 1.2–2.2)
Albumin: 4.8 g/dL (ref 3.8–4.8)
Alkaline Phosphatase: 64 IU/L (ref 44–121)
BUN/Creatinine Ratio: 19 (ref 12–28)
BUN: 31 mg/dL — ABNORMAL HIGH (ref 8–27)
Bilirubin Total: 0.3 mg/dL (ref 0.0–1.2)
CO2: 24 mmol/L (ref 20–29)
Calcium: 9.5 mg/dL (ref 8.7–10.3)
Chloride: 103 mmol/L (ref 96–106)
Creatinine, Ser: 1.66 mg/dL — ABNORMAL HIGH (ref 0.57–1.00)
Globulin, Total: 2.3 g/dL (ref 1.5–4.5)
Glucose: 80 mg/dL (ref 70–99)
Potassium: 3.8 mmol/L (ref 3.5–5.2)
Sodium: 143 mmol/L (ref 134–144)
Total Protein: 7.1 g/dL (ref 6.0–8.5)
eGFR: 33 mL/min/{1.73_m2} — ABNORMAL LOW (ref >59)

## 2021-01-04 LAB — TSH: TSH: 2.48 u[IU]/mL (ref 0.450–4.500)

## 2021-01-04 LAB — RPR: RPR Ser Ql: NONREACTIVE

## 2021-01-04 LAB — VITAMIN B12: Vitamin B-12: 567 pg/mL (ref 232–1245)

## 2021-01-04 LAB — LAMOTRIGINE LEVEL: Lamotrigine Lvl: 17.2 ug/mL (ref 2.0–20.0)

## 2021-01-04 NOTE — Telephone Encounter (Signed)
Patient's husband, [name], per [facility], returned my call. I discussed patient's lab results with him. Patient's husband  verbalized understanding.

## 2021-01-04 NOTE — Telephone Encounter (Signed)
I called patient to discuss. No answer, left a VM asking her to call us back.

## 2021-01-04 NOTE — Telephone Encounter (Signed)
Please call patient, laboratory evaluation showed elevated creatinine, indicating abnormal kidney function, estimated GFR 33, which is about her baseline  Rest of the laboratory evaluation showed no significant abnormalities.

## 2021-01-04 NOTE — Progress Notes (Signed)
EEG report is unde procedure

## 2021-01-11 NOTE — Procedures (Signed)
   HISTORY: 70 year old female, with history of traumatic brain injury, MRI showed extensive left hemisphere encephalomalacia, presented with complex partial seizure  TECHNIQUE:  This is a routine 16 channel EEG recording with one channel devoted to a limited EKG recording.  It was performed during wakefulness, drowsiness and asleep.  Hyperventilation and photic stimulation were performed as activating procedures.  There are minimum muscle and movement artifact noted.  Upon maximum arousal, posterior dominant waking rhythm was asymmetric.  Right side was mildly dysrhythmic theta range activity, left side has higher amplitude, dysrhythmic, lower end of theta range activity with frequent P7, 01 sharp transient.  Hyperventilation produced mild/moderate buildup with higher amplitude and the slower activities noted.  Photic stimulation did not alter the tracing.  During EEG recording, patient developed drowsiness and no deeper stage of sleep was achieved  EKG demonstrate sinus rhythm, with heart rate of 42 bpm  CONCLUSION: This is an abnormal EEG, there is evidence of asymmetric background activity, most noticeable left side as dysrhythmic slow activity, with frequent sharp transient at posterior parietal region, consistent with her history of brain trauma, left encephalomalacia.  She is at high risk for recurrent seizure.  Marcial Pacas, M.D. Ph.D.  [facility] [place], [place] [address] Phone: [phone] Fax:      [phone]

## 2021-02-07 ENCOUNTER — Other Ambulatory Visit: Payer: Self-pay | Admitting: Pulmonary Disease

## 2021-02-07 ENCOUNTER — Ambulatory Visit: Payer: PPO

## 2021-02-07 ENCOUNTER — Ambulatory Visit
Admission: RE | Admit: 2021-02-07 | Discharge: 2021-02-07 | Disposition: A | Discharge status: Home / Self Care | Payer: PPO | Source: Ambulatory Visit | Attending: Pulmonary Disease | Admitting: Pulmonary Disease

## 2021-02-07 DIAGNOSIS — R928 Other abnormal and inconclusive findings on diagnostic imaging of breast: Secondary | ICD-10-CM

## 2021-02-07 DIAGNOSIS — R922 Inconclusive mammogram: Secondary | ICD-10-CM | POA: Diagnosis not present

## 2021-02-17 ENCOUNTER — Other Ambulatory Visit: Payer: Self-pay

## 2021-02-17 MED ORDER — MEMANTINE HCL 10 MG PO TABS: 10.0000 mg | ORAL_TABLET | Freq: Two times a day (BID) | ORAL | 4 refills | Status: DC

## 2021-02-17 NOTE — Telephone Encounter (Signed)
Rx refilled.

## 2021-04-19 ENCOUNTER — Other Ambulatory Visit: Payer: Self-pay

## 2021-04-19 MED ORDER — DONEPEZIL HCL 10 MG PO TABS: 10.0000 mg | ORAL_TABLET | Freq: Every day | ORAL | 1 refills | Status: DC

## 2021-04-19 NOTE — Progress Notes (Signed)
Rx refilled.

## 2021-04-20 ENCOUNTER — Other Ambulatory Visit: Payer: Self-pay | Admitting: Neurology

## 2021-04-25 ENCOUNTER — Other Ambulatory Visit: Payer: Self-pay

## 2021-04-25 ENCOUNTER — Emergency Department (HOSPITAL_COMMUNITY): Payer: Medicare PPO

## 2021-04-25 ENCOUNTER — Encounter (HOSPITAL_COMMUNITY): Payer: Self-pay

## 2021-04-25 ENCOUNTER — Observation Stay (HOSPITAL_COMMUNITY)
Admission: EM | Admit: 2021-04-25 | Discharge: 2021-04-27 | Disposition: A | Discharge status: Home / Self Care | Payer: Medicare PPO | Attending: Internal Medicine | Admitting: Internal Medicine

## 2021-04-25 ENCOUNTER — Ambulatory Visit (HOSPITAL_COMMUNITY)
Admission: EM | Admit: 2021-04-25 | Discharge: 2021-04-25 | Payer: Medicare PPO | Attending: Emergency Medicine | Admitting: Emergency Medicine

## 2021-04-25 DIAGNOSIS — I213 ST elevation (STEMI) myocardial infarction of unspecified site: Secondary | ICD-10-CM | POA: Diagnosis not present

## 2021-04-25 DIAGNOSIS — I129 Hypertensive chronic kidney disease with stage 1 through stage 4 chronic kidney disease, or unspecified chronic kidney disease: Secondary | ICD-10-CM | POA: Diagnosis not present

## 2021-04-25 DIAGNOSIS — R0602 Shortness of breath: Secondary | ICD-10-CM | POA: Insufficient documentation

## 2021-04-25 DIAGNOSIS — R079 Chest pain, unspecified: Principal | ICD-10-CM | POA: Insufficient documentation

## 2021-04-25 DIAGNOSIS — Z7982 Long term (current) use of aspirin: Secondary | ICD-10-CM | POA: Diagnosis not present

## 2021-04-25 DIAGNOSIS — Z79899 Other long term (current) drug therapy: Secondary | ICD-10-CM | POA: Insufficient documentation

## 2021-04-25 DIAGNOSIS — Z20822 Contact with and (suspected) exposure to covid-19: Secondary | ICD-10-CM | POA: Diagnosis not present

## 2021-04-25 DIAGNOSIS — R531 Weakness: Secondary | ICD-10-CM | POA: Diagnosis present

## 2021-04-25 DIAGNOSIS — Z87820 Personal history of traumatic brain injury: Secondary | ICD-10-CM

## 2021-04-25 DIAGNOSIS — I10 Essential (primary) hypertension: Secondary | ICD-10-CM

## 2021-04-25 DIAGNOSIS — F0390 Unspecified dementia without behavioral disturbance: Secondary | ICD-10-CM | POA: Diagnosis not present

## 2021-04-25 DIAGNOSIS — E876 Hypokalemia: Secondary | ICD-10-CM | POA: Diagnosis not present

## 2021-04-25 DIAGNOSIS — N184 Chronic kidney disease, stage 4 (severe): Secondary | ICD-10-CM | POA: Insufficient documentation

## 2021-04-25 DIAGNOSIS — R9431 Abnormal electrocardiogram [ECG] [EKG]: Secondary | ICD-10-CM

## 2021-04-25 LAB — CBC WITH DIFFERENTIAL/PLATELET
Abs Immature Granulocytes: 0.03 10*3/uL (ref 0.00–0.07)
Basophils Absolute: 0 10*3/uL (ref 0.0–0.1)
Basophils Relative: 0 %
Eosinophils Absolute: 0.2 10*3/uL (ref 0.0–0.5)
Eosinophils Relative: 2 %
HCT: 35.9 % — ABNORMAL LOW (ref 36.0–46.0)
Hemoglobin: 11.9 g/dL — ABNORMAL LOW (ref 12.0–15.0)
Immature Granulocytes: 0 %
Lymphocytes Relative: 22 %
Lymphs Abs: 2 10*3/uL (ref 0.7–4.0)
MCH: 31.5 pg (ref 26.0–34.0)
MCHC: 33.1 g/dL (ref 30.0–36.0)
MCV: 95 fL (ref 80.0–100.0)
Monocytes Absolute: 1.3 10*3/uL — ABNORMAL HIGH (ref 0.1–1.0)
Monocytes Relative: 14 %
Neutro Abs: 5.6 10*3/uL (ref 1.7–7.7)
Neutrophils Relative %: 62 %
Platelets: 203 10*3/uL (ref 150–400)
RBC: 3.78 MIL/uL — ABNORMAL LOW (ref 3.87–5.11)
RDW: 13.5 % (ref 11.5–15.5)
WBC: 9.2 10*3/uL (ref 4.0–10.5)
nRBC: 0 % (ref 0.0–0.2)

## 2021-04-25 LAB — COMPREHENSIVE METABOLIC PANEL
ALT: 15 U/L (ref 0–44)
AST: 19 U/L (ref 15–41)
Albumin: 3 g/dL — ABNORMAL LOW (ref 3.5–5.0)
Alkaline Phosphatase: 45 U/L (ref 38–126)
Anion gap: 14 (ref 5–15)
BUN: 32 mg/dL — ABNORMAL HIGH (ref 8–23)
CO2: 22 mmol/L (ref 22–32)
Calcium: 8.8 mg/dL — ABNORMAL LOW (ref 8.9–10.3)
Chloride: 102 mmol/L (ref 98–111)
Creatinine, Ser: 2.18 mg/dL — ABNORMAL HIGH (ref 0.44–1.00)
GFR, Estimated: 24 mL/min — ABNORMAL LOW (ref >60)
Glucose, Bld: 101 mg/dL — ABNORMAL HIGH (ref 70–99)
Potassium: 3.2 mmol/L — ABNORMAL LOW (ref 3.5–5.1)
Sodium: 138 mmol/L (ref 135–145)
Total Bilirubin: 0.5 mg/dL (ref 0.3–1.2)
Total Protein: 7.2 g/dL (ref 6.5–8.1)

## 2021-04-25 LAB — PROTIME-INR
INR: 1.1 (ref 0.8–1.2)
Prothrombin Time: 13.9 seconds (ref 11.4–15.2)

## 2021-04-25 LAB — TROPONIN I (HIGH SENSITIVITY)
Troponin I (High Sensitivity): 17 ng/L (ref <18)
Troponin I (High Sensitivity): 16 ng/L (ref <18)

## 2021-04-25 LAB — BRAIN NATRIURETIC PEPTIDE: B Natriuretic Peptide: 77.3 pg/mL (ref 0.0–100.0)

## 2021-04-25 LAB — MAGNESIUM: Magnesium: 2.4 mg/dL (ref 1.7–2.4)

## 2021-04-25 LAB — APTT: aPTT: 25 seconds (ref 24–36)

## 2021-04-25 LAB — TSH: TSH: 1.48 u[IU]/mL (ref 0.350–4.500)

## 2021-04-25 LAB — RESP PANEL BY RT-PCR (FLU A&B, COVID) ARPGX2
Influenza A by PCR: NEGATIVE
Influenza B by PCR: NEGATIVE
SARS Coronavirus 2 by RT PCR: NEGATIVE

## 2021-04-25 LAB — LIPASE, BLOOD: Lipase: 32 U/L (ref 11–51)

## 2021-04-25 MED ORDER — ASPIRIN 81 MG PO CHEW
CHEWABLE_TABLET | ORAL | Status: AC
  Filled 2021-04-25: qty 4

## 2021-04-25 MED ORDER — ASPIRIN 81 MG PO CHEW
324.0000 mg | CHEWABLE_TABLET | Freq: Once | ORAL | Status: AC
  Administered 2021-04-25: 324 mg via ORAL

## 2021-04-25 NOTE — ED Triage Notes (Signed)
Pt presents with generalized chest pain with left arm pain and fatigue & weakness today.

## 2021-04-25 NOTE — ED Triage Notes (Signed)
Pt from UC for eval of abnormal ekg found when pt presented for intermittent cp since Wednesday of this week. Pain has resolved but patient endorses generalized weakness.

## 2021-04-25 NOTE — ED Notes (Signed)
Patient is being discharged from the Urgent Care and sent to the Emergency Department via [facility] . Per Provider Lynden Oxford, patient is in need of higher level of care due to possible STEMI. Patient is aware and verbalizes understanding of plan of care.  Vitals:   04/25/21 1721  BP: 114/80  Pulse: 69  Resp: 17  Temp: 98.8 F (37.1 C)  SpO2: 94%

## 2021-04-25 NOTE — ED Provider Notes (Signed)
[facility] EMERGENCY DEPARTMENT Provider Note   CSN: [id number] Arrival date & time: 04/25/21  1754     History  Chief Complaint  Patient presents with   Weakness    Deborah Webb is a 71 y.o. female.  Patient is a 71 year old female with a history of traumatic brain injury since she was 41, seizures, hypertension and hyperlipidemia who is presenting today from urgent care due to a possible STEMI.  Patient reports that she has had intermittent chest pain since Wednesday.  She cannot really figure out what brings on the discomfort but reports when she does have the pain it seems to be in the middle and lower chest and upper abdomen.  It makes her feel tired and a bit winded.  She denies any vomiting but does feel little nauseated.  She does not feel that the pain is brought on by food.  She is not sure if it is brought on by exertion.  Her last episode of pain was this morning and reports that it resolved around 11 AM.  She reports feeling just generally tired and states that she takes a medication for her nerves and feels that that helps the pain at times.  She currently is denying any pain at this time.  She has not had any cough, congestion or fever.  No diarrhea.  No prior abdominal surgeries.  She denies any known history of heart disease.  He reports that she was pain-free at urgent care and after she received an EKG they sent her here for further care.  A STEMI was not activated.  The history is provided by the patient and medical records.  Weakness     Home Medications Prior to Admission medications   Medication Sig Start Date End Date Taking? Authorizing Provider  aspirin EC 81 MG tablet Take 81 mg by mouth daily.    [provider]  atorvastatin (LIPITOR) 10 MG tablet Take 10 mg by mouth every morning.     [provider]  BYSTOLIC 10 MG tablet TK 1 T PO QD 12/04/18   [provider]  donepezil (ARICEPT) 10 MG tablet Take 1  tablet (10 mg total) by mouth at bedtime. 04/19/21   Suzzanne Cloud, NP  lamoTRIgine (LAMICTAL) 100 MG tablet Take 1 tablet (100 mg total) by mouth 2 (two) times daily. 12/30/20   Marcial Pacas, MD  losartan (COZAAR) 100 MG tablet Take 50 mg by mouth daily.    [provider]  memantine (NAMENDA) 10 MG tablet Take 1 tablet (10 mg total) by mouth 2 (two) times daily. 02/17/21   Marcial Pacas, MD  metoprolol tartrate (LOPRESSOR) 50 MG tablet Take 50 mg by mouth 2 (two) times daily. 12/28/20   [provider]  Multiple Vitamins-Minerals (MULTIVITAMIN PO) Take 1 tablet by mouth every Monday.     [provider]  QUEtiapine (SEROQUEL) 50 MG tablet Take 1 tablet (50 mg total) by mouth at bedtime. 12/30/20   Marcial Pacas, MD  triamterene-hydrochlorothiazide (MAXZIDE-25) 37.5-25 MG per tablet Take 1 tablet by mouth daily.    [provider]      Allergies    Patient has no known allergies.    Review of Systems   Review of Systems  Neurological:  Positive for weakness.   Physical Exam Updated Vital Signs BP 130/80    Pulse 62    Temp 97.8 F (36.6 C) (Temporal)    Resp 16    Ht 5'  11" (1.803 m)    Wt 74.8 kg    SpO2 98%    BMI 23.01 kg/m  Physical Exam Vitals and nursing note reviewed.  Constitutional:      General: She is not in acute distress.    Appearance: She is well-developed.  HENT:     Head: Normocephalic and atraumatic.  Eyes:     Conjunctiva/sclera: Conjunctivae normal.     Pupils: Pupils are equal, round, and reactive to light.  Cardiovascular:     Rate and Rhythm: Normal rate and regular rhythm.     Pulses: Normal pulses.     Heart sounds: No murmur heard. Pulmonary:     Effort: Pulmonary effort is normal. No respiratory distress.     Breath sounds: Normal breath sounds. No wheezing or rales.  Abdominal:     General: There is no distension.     Palpations: Abdomen is soft.     Tenderness: There is abdominal tenderness. There is no guarding or  rebound.     Comments: Minimal tenderness with palpation in the upper abdomen but no guarding or rigidity  Musculoskeletal:        General: No tenderness. Normal range of motion.     Cervical back: Normal range of motion and neck supple.     Right lower leg: No edema.     Left lower leg: No edema.  Skin:    General: Skin is warm and dry.     Findings: No erythema or rash.  Neurological:     Mental Status: She is alert and oriented to person, place, and time. Mental status is at baseline.  Psychiatric:        Mood and Affect: Mood normal.        Behavior: Behavior normal.    ED Results / Procedures / Treatments   Labs (all labs ordered are listed, but only abnormal results are displayed) Labs Reviewed  CBC WITH DIFFERENTIAL/PLATELET - Abnormal; Notable for the following components:      Result Value   RBC 3.78 (*)    Hemoglobin 11.9 (*)    HCT 35.9 (*)    Monocytes Absolute 1.3 (*)    All other components within normal limits  COMPREHENSIVE METABOLIC PANEL - Abnormal; Notable for the following components:   Potassium 3.2 (*)    Glucose, Bld 101 (*)    BUN 32 (*)    Creatinine, Ser 2.18 (*)    Calcium 8.8 (*)    Albumin 3.0 (*)    GFR, Estimated 24 (*)    All other components within normal limits  RESP PANEL BY RT-PCR (FLU A&B, COVID) ARPGX2  LIPASE, BLOOD  APTT  PROTIME-INR  BRAIN NATRIURETIC PEPTIDE  TSH  MAGNESIUM  TROPONIN I (HIGH SENSITIVITY)  TROPONIN I (HIGH SENSITIVITY)    EKG EKG Interpretation  Date/Time:  Monday April 25 2021 18:02:13 EST Ventricular Rate:  69 PR Interval:  182 QRS Duration: 93 QT Interval:  380 QTC Calculation: 408 R Axis:   147 Text Interpretation: Right and left arm electrode reversal, interpretation assumes no reversal Sinus or ectopic atrial rhythm Right axis deviation new Repol abnrm suggests ischemia, diffuse leads Confirmed by Blanchie Dessert ([id number]) on 04/25/2021 6:08:39 PM  Radiology DG Chest Port 1 View  Result  Date: 04/25/2021 CLINICAL DATA:  Chest pain, weakness EXAM: PORTABLE CHEST 1 VIEW COMPARISON:  09/07/2005 FINDINGS: Single frontal view of the chest demonstrates an enlarged cardiac silhouette. Basilar predominant interstitial opacities and airspace disease, with small bilateral  pleural effusions, most consistent with CHF. No pneumothorax. No acute bony abnormalities. IMPRESSION: 1. Findings consistent with mild congestive heart failure. Electronically Signed   By: Randa Ngo M.D.   On: 04/25/2021 18:19    Procedures Procedures    Medications Ordered in ED Medications - No data to display  ED Course/ Medical Decision Making/ A&P                           Medical Decision Making Amount and/or Complexity of Data Reviewed External Data Reviewed: notes. Labs: ordered. Decision-making details documented in ED Course. Radiology: ordered and independent interpretation performed. Decision-making details documented in ED Course. ECG/medicine tests: ordered and independent interpretation performed. Decision-making details documented in ED Course.  Risk Decision regarding hospitalization.   Patient is a 71 year old female presenting today with multiple comorbidities with high risk complaints of chest pain.  The pain has been intermittent since Wednesday her last episode of pain was this morning and resolved at 11:00.  She went to urgent care and had an abnormal EKG and was sent here for concern for possible STEMI.  Patient is a poor historian and unclear what causes the pain.  She does report no pain at this time.  Vital signs are normal.  She denies any recent medication changes.  Patient is EKG from urgent care has significant artifact and repeat was done here.  I independently interpreted patient's EKG here and she does have new significant ST depression and T wave inversion in V3 and V4.  No evidence of ST elevation.  However possible right and left arm electrode reversal and will have that  checked and a repeat EKG done.  She denies any infectious symptoms and low suspicion today for pericarditis, myocarditis, tamponade, PE or dissection.  Low suspicion for pneumonia at this time.  Based on her complaints, possible new EKG findings will work-up for ACS.  However she does have some mild upper abdominal pain as well and will get LFTs and lipase.  Labs and imaging are pending.  Patient is currently pain-free and already received aspirin prior to arrival.  10:47 PM Patient repeat EKG was unchanged from the initial 1.  I independently evaluated her x-ray and labs.  Her x-ray today does show some pulmonary edema.  Radiology reports findings consistent with mild CHF.  Patient's BNP is within normal limits, COVID and flu are negative, PTT and INR are within normal limits, lipase and CBC are within normal limits.  Patient's CMP today shows gradually worsening renal function now with a creatinine of 2.18 from 1.6 in October.  Given patient's symptoms, EKG changes but negative troponins feel that she needs admission for further evaluation and possible echo.  Spoke with the hospitalist who agrees to admit.  Spoke with the patient and her husband about these findings and they are comfortable with this plan.  She has no social determinants affecting her care today.        Final Clinical Impression(s) / ED Diagnoses Final diagnoses:  Nonspecific chest pain  EKG abnormalities    Rx / DC Orders ED Discharge Orders     None         Blanchie Dessert, MD 04/25/21 2249

## 2021-04-25 NOTE — ED Notes (Addendum)
Report given to [facility] at bedside, here to transport pt to ED.

## 2021-04-25 NOTE — ED Notes (Signed)
Cardiologist on call paged by [facility] and Camera operator in ED given report.

## 2021-04-26 ENCOUNTER — Inpatient Hospital Stay (HOSPITAL_BASED_OUTPATIENT_CLINIC_OR_DEPARTMENT_OTHER): Payer: Medicare PPO

## 2021-04-26 ENCOUNTER — Encounter (HOSPITAL_COMMUNITY): Payer: Self-pay | Admitting: Family Medicine

## 2021-04-26 DIAGNOSIS — R079 Chest pain, unspecified: Secondary | ICD-10-CM

## 2021-04-26 DIAGNOSIS — I10 Essential (primary) hypertension: Secondary | ICD-10-CM

## 2021-04-26 DIAGNOSIS — Z87820 Personal history of traumatic brain injury: Secondary | ICD-10-CM

## 2021-04-26 DIAGNOSIS — E876 Hypokalemia: Secondary | ICD-10-CM

## 2021-04-26 DIAGNOSIS — N184 Chronic kidney disease, stage 4 (severe): Secondary | ICD-10-CM

## 2021-04-26 DIAGNOSIS — F03A0 Unspecified dementia, mild, without behavioral disturbance, psychotic disturbance, mood disturbance, and anxiety: Secondary | ICD-10-CM

## 2021-04-26 LAB — ECHOCARDIOGRAM COMPLETE
AR max vel: 2.32 cm2
AV Area VTI: 2.46 cm2
AV Area mean vel: 2.24 cm2
AV Mean grad: 6 mmHg
AV Peak grad: 12.3 mmHg
Ao pk vel: 1.75 m/s
Area-P 1/2: 4.68 cm2
Calc EF: 52.4 %
Height: 71 in
S' Lateral: 2.2 cm
Single Plane A2C EF: 54.4 %
Single Plane A4C EF: 52.2 %
Weight: 2640 oz

## 2021-04-26 LAB — CBC
HCT: 43.4 % (ref 36.0–46.0)
Hemoglobin: 12.9 g/dL (ref 12.0–15.0)
MCH: 31.2 pg (ref 26.0–34.0)
MCHC: 29.7 g/dL — ABNORMAL LOW (ref 30.0–36.0)
MCV: 105.1 fL — ABNORMAL HIGH (ref 80.0–100.0)
Platelets: 183 10*3/uL (ref 150–400)
RBC: 4.13 MIL/uL (ref 3.87–5.11)
RDW: 13.7 % (ref 11.5–15.5)
WBC: 5.7 10*3/uL (ref 4.0–10.5)
nRBC: 0 % (ref 0.0–0.2)

## 2021-04-26 LAB — TROPONIN I (HIGH SENSITIVITY): Troponin I (High Sensitivity): 19 ng/L — ABNORMAL HIGH (ref <18)

## 2021-04-26 LAB — BASIC METABOLIC PANEL
Anion gap: 16 — ABNORMAL HIGH (ref 5–15)
BUN: 33 mg/dL — ABNORMAL HIGH (ref 8–23)
CO2: 19 mmol/L — ABNORMAL LOW (ref 22–32)
Calcium: 8.8 mg/dL — ABNORMAL LOW (ref 8.9–10.3)
Chloride: 104 mmol/L (ref 98–111)
Creatinine, Ser: 2.16 mg/dL — ABNORMAL HIGH (ref 0.44–1.00)
GFR, Estimated: 24 mL/min — ABNORMAL LOW (ref >60)
Glucose, Bld: 145 mg/dL — ABNORMAL HIGH (ref 70–99)
Potassium: 3.9 mmol/L (ref 3.5–5.1)
Sodium: 139 mmol/L (ref 135–145)

## 2021-04-26 MED ORDER — ACETAMINOPHEN 325 MG PO TABS: 650.0000 mg | ORAL_TABLET | Freq: Four times a day (QID) | ORAL | Status: DC | PRN

## 2021-04-26 MED ORDER — TRIAMTERENE-HCTZ 37.5-25 MG PO TABS
1.0000 | ORAL_TABLET | Freq: Every day | ORAL | Status: DC
  Administered 2021-04-26: 1 via ORAL
  Filled 2021-04-26 (×2): qty 1

## 2021-04-26 MED ORDER — HEPARIN SODIUM (PORCINE) 5000 UNIT/ML IJ SOLN
5000.0000 [IU] | Freq: Three times a day (TID) | INTRAMUSCULAR | Status: DC
  Administered 2021-04-26 – 2021-04-27 (×4): 5000 [IU] via SUBCUTANEOUS
  Filled 2021-04-26 (×4): qty 1

## 2021-04-26 MED ORDER — ONDANSETRON HCL 4 MG/2ML IJ SOLN: 4.0000 mg | Freq: Four times a day (QID) | INTRAMUSCULAR | Status: DC | PRN

## 2021-04-26 MED ORDER — SENNOSIDES-DOCUSATE SODIUM 8.6-50 MG PO TABS: 1.0000 | ORAL_TABLET | Freq: Every evening | ORAL | Status: DC | PRN

## 2021-04-26 MED ORDER — METOPROLOL TARTRATE 50 MG PO TABS
50.0000 mg | ORAL_TABLET | Freq: Two times a day (BID) | ORAL | Status: DC
  Administered 2021-04-26 – 2021-04-27 (×4): 50 mg via ORAL
  Filled 2021-04-26: qty 1
  Filled 2021-04-26: qty 2
  Filled 2021-04-26: qty 1
  Filled 2021-04-26: qty 2

## 2021-04-26 MED ORDER — MEMANTINE HCL 10 MG PO TABS
10.0000 mg | ORAL_TABLET | Freq: Two times a day (BID) | ORAL | Status: DC
  Administered 2021-04-26 – 2021-04-27 (×4): 10 mg via ORAL
  Filled 2021-04-26 (×5): qty 1

## 2021-04-26 MED ORDER — ATORVASTATIN CALCIUM 10 MG PO TABS
10.0000 mg | ORAL_TABLET | ORAL | Status: DC
  Administered 2021-04-26 – 2021-04-27 (×2): 10 mg via ORAL
  Filled 2021-04-26 (×2): qty 1

## 2021-04-26 MED ORDER — ONDANSETRON HCL 4 MG PO TABS: 4.0000 mg | ORAL_TABLET | Freq: Four times a day (QID) | ORAL | Status: DC | PRN

## 2021-04-26 MED ORDER — ACETAMINOPHEN 650 MG RE SUPP: 650.0000 mg | Freq: Four times a day (QID) | RECTAL | Status: DC | PRN

## 2021-04-26 MED ORDER — LACTATED RINGERS IV SOLN: INTRAVENOUS | Status: DC

## 2021-04-26 MED ORDER — LACTATED RINGERS IV SOLN: INTRAVENOUS | Status: AC

## 2021-04-26 MED ORDER — DONEPEZIL HCL 10 MG PO TABS
10.0000 mg | ORAL_TABLET | Freq: Every day | ORAL | Status: DC
  Administered 2021-04-26 (×2): 10 mg via ORAL
  Filled 2021-04-26 (×3): qty 1

## 2021-04-26 MED ORDER — ASPIRIN EC 81 MG PO TBEC
81.0000 mg | DELAYED_RELEASE_TABLET | Freq: Every day | ORAL | Status: DC
Start: 2021-04-26 — End: 2021-04-27
  Administered 2021-04-26 – 2021-04-27 (×2): 81 mg via ORAL
  Filled 2021-04-26 (×2): qty 1

## 2021-04-26 MED ORDER — POTASSIUM CHLORIDE CRYS ER 20 MEQ PO TBCR
40.0000 meq | EXTENDED_RELEASE_TABLET | Freq: Once | ORAL | Status: AC
  Administered 2021-04-26: 40 meq via ORAL
  Filled 2021-04-26: qty 2

## 2021-04-26 NOTE — Assessment & Plan Note (Addendum)
Stable CKD.  Creatinine mildly elevated from previous level of 1.75 but GFR stable. Recheck renal function on labs in am

## 2021-04-26 NOTE — Progress Notes (Addendum)
PROGRESS NOTE                                                                                                                                                                                                             Patient Demographics:    Deborah Webb, is a 71 y.o. female, DOB - Jul 22, 1950, WCB:[id number]  Outpatient Primary MD for the patient is Vincente Liberty, MD    LOS - 1  Admit date - 04/25/2021    Chief Complaint  Patient presents with   Weakness       Brief Narrative (HPI from H&P)  - Deborah Webb is a 71 y.o. female with medical history significant of TBI as teenager, HTN, HLD, seizure disorder, dementia who was sent to the emergency room from a local urgent care for possible STEMI.   She apparently was at home and started having left-sided chest pain going to her arm making her nauseated and short of breath yesterday, went to urgent care where her EKG was suspicious for STEMI and she was sent to [facility].  Here EKG showed nonspecific ST changes troponin was flat and she was kept in the hospital for further care.   Subjective:    Deborah Webb today has, No headache, No chest pain, No abdominal pain - No Nausea, No new weakness tingling or numbness, no SOB.   Assessment  & Plan :   Chest pain - she had initial suspicion of STEMI at an urgent care, EKG here has nonspecific changes, troponin is flat, she is currently chest pain-free, on aspirin and statin along with beta-blocker.  Will check echocardiogram, have requested cardiology to evaluate her as she may require further testing.  2.  AKI on CKD 4 baseline creatinine around 1.75.  Hydrate, hold ARB, monitor.  3.  History of TBI in the remote past along with some dementia.  Continue Aricept and Namenda.  At risk for delirium.  4.  Essential hypertension.  On combination of beta-blocker and diuretic here, ARB held due to AKI.  Continue and  monitor.  5.  Hypokalemia.  Replaced will continue to monitor.       Condition - Fair  Family Communication  :  None present  Code Status :  Full  Consults  :  Cards  PUD Prophylaxis :     Procedures  :  Disposition Plan  :    Status is: Inpatient  DVT Prophylaxis  :    heparin injection 5,000 Units Start: 04/26/21 0045  Lab Results  Component Value Date   PLT 183 04/26/2021    Diet :  Diet Order             Diet Heart Room service appropriate? Yes; Fluid consistency: Thin  Diet effective now                    Inpatient Medications  Scheduled Meds:  aspirin EC  81 mg Oral Daily   atorvastatin  10 mg Oral BH-q7a   donepezil  10 mg Oral QHS   heparin  5,000 Units Subcutaneous Q8H   memantine  10 mg Oral BID   metoprolol tartrate  50 mg Oral BID   triamterene-hydrochlorothiazide  1 tablet Oral Daily   Continuous Infusions:  lactated ringers 75 mL/hr at 04/26/21 0133   PRN Meds:.acetaminophen **OR** acetaminophen, ondansetron **OR** ondansetron (ZOFRAN) IV, senna-docusate  Antibiotics  :    Anti-infectives (From admission, onward)    None        Time Spent in minutes  30   Lala Lund M.D on 04/26/2021 at 8:43 AM  To page go to [email or web address]   [facility] -  Office  [phone]  See all Orders from today for further details    Objective:   Vitals:   04/26/21 0615 04/26/21 0630 04/26/21 0645 04/26/21 0700  BP: 119/78 130/86 114/69 121/82  Pulse: 78 78 78 78  Resp: 19 (!) 23 18 17   Temp:      TempSrc:      SpO2: 95% 98% 95% 99%  Weight:      Height:        Wt Readings from Last 3 Encounters:  04/25/21 74.8 kg  12/30/20 81.6 kg  12/31/19 80.6 kg    No intake or output data in the 24 hours ending 04/26/21 0843   Physical Exam  Awake Alert, No new F.N deficits, Normal affect [place].AT,PERRAL Supple Neck, No JVD,   Symmetrical Chest wall movement, Good air movement bilaterally, CTAB RRR,No  Gallops,Rubs or new Murmurs,  +ve B.Sounds, Abd Soft, No tenderness,   No Cyanosis, Clubbing or edema      Data Review:    CBC Recent Labs  Lab 04/25/21 1817 04/26/21 0217  WBC 9.2 5.7  HGB 11.9* 12.9  HCT 35.9* 43.4  PLT 203 183  MCV 95.0 105.1*  MCH 31.5 31.2  MCHC 33.1 29.7*  RDW 13.5 13.7  LYMPHSABS 2.0  --   MONOABS 1.3*  --   EOSABS 0.2  --   BASOSABS 0.0  --     Electrolytes Recent Labs  Lab 04/25/21 1817 04/25/21 2042 04/26/21 0217  NA 138  --  139  K 3.2*  --  3.9  CL 102  --  104  CO2 22  --  19*  GLUCOSE 101*  --  145*  BUN 32*  --  33*  CREATININE 2.18*  --  2.16*  CALCIUM 8.8*  --  8.8*  AST 19  --   --   ALT 15  --   --   ALKPHOS 45  --   --   BILITOT 0.5  --   --   ALBUMIN 3.0*  --   --   MG  --  2.4  --   INR 1.1  --   --   TSH  --  1.480  --   BNP  --  77.3  --    ------------------------------------------------------------------------------------------------------------------ No results for input(s): CHOL, HDL, LDLCALC, TRIG, CHOLHDL, LDLDIRECT in the last 72 hours.  Lab Results  Component Value Date   HGBA1C 5.4 08/20/2016    Recent Labs    04/25/21 2042  TSH 1.480   ------------------------------------------------------------------------------------------------------------------ ID Labs Recent Labs  Lab 04/25/21 1817 04/26/21 0217  WBC 9.2 5.7  PLT 203 183  CREATININE 2.18* 2.16*   Cardiac Enzymes No results for input(s): CKMB, TROPONINI, MYOGLOBIN in the last 168 hours.  Invalid input(s): CK   Radiology Reports DG Chest Port 1 View  Result Date: 04/25/2021 CLINICAL DATA:  Chest pain, weakness EXAM: PORTABLE CHEST 1 VIEW COMPARISON:  09/07/2005 FINDINGS: Single frontal view of the chest demonstrates an enlarged cardiac silhouette. Basilar predominant interstitial opacities and airspace disease, with small bilateral pleural effusions, most consistent with CHF. No pneumothorax. No acute bony abnormalities.  IMPRESSION: 1. Findings consistent with mild congestive heart failure. Electronically Signed   By: Randa Ngo M.D.   On: 04/25/2021 18:19

## 2021-04-26 NOTE — Progress Notes (Signed)
° °  Echocardiogram 2D Echocardiogram has been performed.  Beryle Beams 04/26/2021, 9:18 AM

## 2021-04-26 NOTE — Assessment & Plan Note (Signed)
Continue aricept and namenda.   

## 2021-04-26 NOTE — Assessment & Plan Note (Addendum)
Continue beta blocker, maxzide. Monitor BP. Hold ARB with CKD 4

## 2021-04-26 NOTE — Assessment & Plan Note (Signed)
Deborah Webb placed on cardiac telemetry for observation for chest pain.  Obtain serial troponin levels.  If troponins remain negative we will proceed with stress test.  If troponins become positive will consult cardiology.  Antiplatelet therapy with aspirin daily.  Check lipid panel.  Continue statin therapy.  Monitor blood pressure.  Nitroglycerin as needed. Supplemental oxygen as needed to maintain O2 sat between 92-96%

## 2021-04-26 NOTE — ED Notes (Signed)
Echo at bedside

## 2021-04-26 NOTE — Progress Notes (Signed)
Patient lives at home with husband

## 2021-04-26 NOTE — ED Notes (Signed)
Lunch tray at bedside. ?

## 2021-04-26 NOTE — ED Notes (Signed)
Cardiology at bedside.

## 2021-04-26 NOTE — Care Management CC44 (Signed)
Condition Code 44 Documentation Completed  Patient Details  Name: MERCIE BALSLEY MRN: [id number] Date of Birth: 20-Dec-1950   Condition Code 44 given:  Yes Patient signature on Condition Code 44 notice:  Yes Documentation of 2 MD's agreement:  Yes Code 44 added to claim:  Yes    Laurena Slimmer, RN 04/26/2021, 6:36 PM

## 2021-04-26 NOTE — ED Notes (Signed)
Husband at bedside.  

## 2021-04-26 NOTE — Assessment & Plan Note (Signed)
Replete potassium  Magnesium level is normal.  Recheck electrolytes in am

## 2021-04-26 NOTE — Consult Note (Addendum)
Cardiology Consultation:   Patient ID: Deborah Webb MRN: [id number]; DOB: Aug 05, 1950  Admit date: 04/25/2021 Date of Consult: 04/26/2021  PCP:  Vincente Liberty, MD   [facility] Providers Cardiologist:  Shelva Majestic, MD  (new)  Patient Profile:   Deborah Webb is a 71 y.o. female with a hx of TBI as a teenager, HTN, HLD, seizure disorder, dementia who is being seen 04/26/2021 for the evaluation of chest pain at the request of Cr. Singh.  History of Present Illness:   Deborah Webb is a 71 year old female with above medical history. Per chart review, patient does not have any significant cardiac history and has not been seen by a cardiologist.   Patient presented an urgent care on 2/13 complaining of generalized chest pain, left arm pain, fatigue, and weakness. Patient was transported to the ED, and her pain had resolved by arrival. Labs in the ED showed Na 138, K 3.2, creatinine 2.18, BUN 32, eGFR 24, hemoglobin 11.9, WBC 9.2. COVID/Flu negative. HSTN 17>>16>>19. BNP 77.3. EKG showed sinus rhythm, inverted T-waves in leads II, aVL, aVF, V3-V5. Chest x-ray showed findings consistent with mild congestive heart failure. Echocardiogram on 2/14 showed LVEF 55-60%, normal LV and RV systolic function, normal LV diastolic parameters.   On interview, patient reports that she has been having intermittent chest pain for about a week. She is a poor historian due to her previous TBI and history of dementia. Describes chest pressure and pounding in the center of her chest. Denies any radiation of chest pain, but had told other providers that the pain radiated down her left arm. Associated with SOB, fatigue, some nausea. Chest pain is not associated with exertion, happens randomly. Currently, she does not have any symptoms and is chest pain free.   Past Medical History:  Diagnosis Date   Hyperlipidemia    Hypertension    Seizures ([place])     Past Surgical History:  Procedure Laterality  Date   BRAIN SURGERY  1968   BREAST CYST ASPIRATION Right      Home Medications:  Prior to Admission medications   Medication Sig Start Date End Date Taking? Authorizing Provider  aspirin EC 81 MG tablet Take 81 mg by mouth daily.   Yes [provider]  atorvastatin (LIPITOR) 10 MG tablet Take 10 mg by mouth every morning.    Yes [provider]  BYSTOLIC 10 MG tablet Take 10 mg by mouth at bedtime. 12/04/18  Yes [provider]  donepezil (ARICEPT) 10 MG tablet Take 1 tablet (10 mg total) by mouth at bedtime. 04/19/21  Yes Suzzanne Cloud, NP  lamoTRIgine (LAMICTAL) 100 MG tablet Take 1 tablet (100 mg total) by mouth 2 (two) times daily. 12/30/20  Yes Marcial Pacas, MD  losartan (COZAAR) 100 MG tablet Take 50 mg by mouth daily.   Yes [provider]  memantine (NAMENDA) 10 MG tablet Take 1 tablet (10 mg total) by mouth 2 (two) times daily. 02/17/21  Yes Marcial Pacas, MD  metoprolol tartrate (LOPRESSOR) 50 MG tablet Take 50 mg by mouth 2 (two) times daily. 12/28/20  Yes [provider]  Multiple Vitamins-Minerals (MULTIVITAMIN PO) Take 1 tablet by mouth every Monday.    Yes [provider]  QUEtiapine (SEROQUEL) 50 MG tablet Take 1 tablet (50 mg total) by mouth at bedtime. 12/30/20  Yes Marcial Pacas, MD  triamterene-hydrochlorothiazide (MAXZIDE-25) 37.5-25 MG per tablet Take 1 tablet by mouth daily.   Yes [provider]  Inpatient Medications: Scheduled Meds:  aspirin EC  81 mg Oral Daily   atorvastatin  10 mg Oral [id number]   donepezil  10 mg Oral QHS   heparin  5,000 Units Subcutaneous Q8H   memantine  10 mg Oral BID   metoprolol tartrate  50 mg Oral BID   triamterene-hydrochlorothiazide  1 tablet Oral Daily   Continuous Infusions:  lactated ringers 75 mL/hr at 04/26/21 0928   PRN Meds: acetaminophen **OR** acetaminophen, ondansetron **OR** ondansetron (ZOFRAN) IV, senna-docusate  Allergies:   No Known Allergies  Social  History:   Social History   Socioeconomic History   Marital status: Married    Spouse name: Not on file   Number of children: Not on file   Years of education: HS   Highest education level: Not on file  Occupational History   Occupation: Retired  Tobacco Use   Smoking status: Never   Smokeless tobacco: Never  Vaping Use   Vaping Use: Never used  Substance and Sexual Activity   Alcohol use: No   Drug use: No   Sexual activity: Not on file  Other Topics Concern   Not on file  Social History Narrative   Lives at home with her husband.   Her only child, 20 year-old son, passed away in 03-13-16.   1 cup caffeine per day.   Social Determinants of Health   Financial Resource Strain: Not on file  Food Insecurity: Not on file  Transportation Needs: Not on file  Physical Activity: Not on file  Stress: Not on file  Social Connections: Not on file  Intimate Partner Violence: Not on file    Family History:    Family History  Problem Relation Age of Onset   Breast cancer Mother    Other Father        unsure of medical history     ROS:  Please see the history of present illness.   All other ROS reviewed and negative.     Physical Exam/Data:   Vitals:   04/26/21 1115 04/26/21 1200 04/26/21 1205 04/26/21 1245  BP: 120/87 130/87  125/90  Pulse: 77 77 76 83  Resp: 20 (!) _0 Temp:      TempSrc:      SpO2: 98% 97% 100% 98%  Weight:      Height:        Intake/Output Summary (Last 24 hours) at 04/26/2021 1353 Last data filed at 04/26/2021 0859 Gross per 24 hour  Intake 461.7 ml  Output --  Net 461.7 ml   Last 3 Weights 04/25/2021 12/30/2020 12/31/2019  Weight (lbs) 165 lb 180 lb 177 lb 9.6 oz  Weight (kg) 74.844 kg 81.647 kg 80.559 kg     Body mass index is 23.01 kg/m.  General:  Well nourished, well developed, in no acute distress HEENT: normal Neck: no JVD Vascular: Radial and dorsalis pedis pulses 2+ bilaterally Cardiac:  normal S1, S2; RRR; no  murmur  Lungs:  clear to auscultation bilaterally, no wheezing, rhonchi or rales  Abd: soft, nontender   Ext: no edema Musculoskeletal:  No deformities  Skin: warm and dry  Neuro:  CNs 2-12 intact, no focal abnormalities noted Psych:  Normal affect   EKG:  The EKG was personally reviewed and demonstrates:  Sinus rhythm, inverted T waves in leads II, III, aVF, V2-V5.  Telemetry:  Telemetry was personally reviewed and demonstrates:  Sinus Rhythm   Relevant CV Studies:  Echo 04/26/2021  1. Left ventricular ejection fraction, by estimation, is 55 to 60%. The  left ventricle has normal function. The left ventricle has no regional  wall motion abnormalities. There is mild concentric left ventricular  hypertrophy. Left ventricular diastolic  parameters were normal.   2. Right ventricular systolic function is normal. The right ventricular  size is normal. There is normal pulmonary artery systolic pressure. The  estimated right ventricular systolic pressure is 62.9 mmHg.   3. The mitral valve is grossly normal. No evidence of mitral valve  regurgitation. No evidence of mitral stenosis.   4. The aortic valve is grossly normal. Aortic valve regurgitation is not  visualized. No aortic stenosis is present.   5. The inferior vena cava is normal in size with greater than 50%  respiratory variability, suggesting right atrial pressure of 3 mmHg.   Conclusion(s)/Recommendation(s): Normal biventricular function without  evidence of hemodynamically significant valvular heart disease.   Laboratory Data:  High Sensitivity Troponin:   Recent Labs  Lab 04/25/21 1817 04/25/21 2042 04/26/21 0217  TROPONINIHS 17 16 19*     Chemistry Recent Labs  Lab 04/25/21 1817 04/25/21 2042 04/26/21 0217  NA 138  --  139  K 3.2*  --  3.9  CL 102  --  104  CO2 22  --  19*  GLUCOSE 101*  --  145*  BUN 32*  --  33*  CREATININE 2.18*  --  2.16*  CALCIUM 8.8*  --  8.8*  MG  --  2.4  --   GFRNONAA 24*  --   24*  ANIONGAP 14  --  16*    Recent Labs  Lab 04/25/21 1817  PROT 7.2  ALBUMIN 3.0*  AST 19  ALT 15  ALKPHOS 45  BILITOT 0.5   Lipids No results for input(s): CHOL, TRIG, HDL, LABVLDL, LDLCALC, CHOLHDL in the last 168 hours.  Hematology Recent Labs  Lab 04/25/21 1817 04/26/21 0217  WBC 9.2 5.7  RBC 3.78* 4.13  HGB 11.9* 12.9  HCT 35.9* 43.4  MCV 95.0 105.1*  MCH 31.5 31.2  MCHC 33.1 29.7*  RDW 13.5 13.7  PLT 203 183   Thyroid  Recent Labs  Lab 04/25/21 2042  TSH 1.480    BNP Recent Labs  Lab 04/25/21 2042  BNP 77.3    DDimer No results for input(s): DDIMER in the last 168 hours.   Radiology/Studies:  DG Chest Port 1 View  Result Date: 04/25/2021 CLINICAL DATA:  Chest pain, weakness EXAM: PORTABLE CHEST 1 VIEW COMPARISON:  09/07/2005 FINDINGS: Single frontal view of the chest demonstrates an enlarged cardiac silhouette. Basilar predominant interstitial opacities and airspace disease, with small bilateral pleural effusions, most consistent with CHF. No pneumothorax. No acute bony abnormalities. IMPRESSION: 1. Findings consistent with mild congestive heart failure. Electronically Signed   By: Randa Ngo M.D.   On: 04/25/2021 18:19   ECHOCARDIOGRAM COMPLETE  Result Date: 04/26/2021    ECHOCARDIOGRAM REPORT   Patient Name:   RETAL TONKINSON Date of Exam: 04/26/2021 Medical Rec #:  [id number]          Height:       71.0 in Accession #:    [id number]         Weight:       165.0 lb Date of Birth:  Mar 13, 1951          BSA:          1.943 m Patient Age:    41 years  BP:           121/82 mmHg Patient Gender: F                  HR:           87 bpm. Exam Location:  Inpatient Procedure: 2D Echo, Cardiac Doppler and Color Doppler Indications:    R07.9 CHEST PAIN  History:        Patient has prior history of Echocardiogram examinations, most                 recent 08/20/2016. Risk Factors:Hypertension and Dyslipidemia.                 SEIZURES.  Sonographer:     Beryle Beams Referring Phys: [place]  1. Left ventricular ejection fraction, by estimation, is 55 to 60%. The left ventricle has normal function. The left ventricle has no regional wall motion abnormalities. There is mild concentric left ventricular hypertrophy. Left ventricular diastolic parameters were normal.  2. Right ventricular systolic function is normal. The right ventricular size is normal. There is normal pulmonary artery systolic pressure. The estimated right ventricular systolic pressure is 22.0 mmHg.  3. The mitral valve is grossly normal. No evidence of mitral valve regurgitation. No evidence of mitral stenosis.  4. The aortic valve is grossly normal. Aortic valve regurgitation is not visualized. No aortic stenosis is present.  5. The inferior vena cava is normal in size with greater than 50% respiratory variability, suggesting right atrial pressure of 3 mmHg. Conclusion(s)/Recommendation(s): Normal biventricular function without evidence of hemodynamically significant valvular heart disease. FINDINGS  Left Ventricle: Left ventricular ejection fraction, by estimation, is 55 to 60%. The left ventricle has normal function. The left ventricle has no regional wall motion abnormalities. The left ventricular internal cavity size was normal in size. There is  mild concentric left ventricular hypertrophy. Left ventricular diastolic parameters were normal. Right Ventricle: The right ventricular size is normal. No increase in right ventricular wall thickness. Right ventricular systolic function is normal. There is normal pulmonary artery systolic pressure. The tricuspid regurgitant velocity is 2.63 m/s, and  with an assumed right atrial pressure of 3 mmHg, the estimated right ventricular systolic pressure is 25.4 mmHg. Left Atrium: Left atrial size was normal in size. Right Atrium: Right atrial size was normal in size. Pericardium: There is no evidence of pericardial effusion. Mitral  Valve: The mitral valve is grossly normal. No evidence of mitral valve regurgitation. No evidence of mitral valve stenosis. Tricuspid Valve: The tricuspid valve is grossly normal. Tricuspid valve regurgitation is trivial. No evidence of tricuspid stenosis. Aortic Valve: The aortic valve is grossly normal. Aortic valve regurgitation is not visualized. No aortic stenosis is present. Aortic valve mean gradient measures 6.0 mmHg. Aortic valve peak gradient measures 12.2 mmHg. Aortic valve area, by VTI measures  2.46 cm. Pulmonic Valve: The pulmonic valve was grossly normal. Pulmonic valve regurgitation is not visualized. No evidence of pulmonic stenosis. Aorta: The aortic root and ascending aorta are structurally normal, with no evidence of dilitation. Venous: The inferior vena cava is normal in size with greater than 50% respiratory variability, suggesting right atrial pressure of 3 mmHg. IAS/Shunts: The atrial septum is grossly normal.  LEFT VENTRICLE PLAX 2D LVIDd:         4.30 cm     Diastology LVIDs:         2.20 cm     LV e' medial:    7.62  cm/s LV PW:         1.40 cm     LV E/e' medial:  8.7 LV IVS:        1.40 cm     LV e' lateral:   4.79 cm/s LVOT diam:     2.00 cm     LV E/e' lateral: 13.8 LV SV:         68 LV SV Index:   35 LVOT Area:     3.14 cm  LV Volumes (MOD) LV vol d, MOD A2C: 50.9 ml LV vol d, MOD A4C: 64.8 ml LV vol s, MOD A2C: 23.2 ml LV vol s, MOD A4C: 31.0 ml LV SV MOD A2C:     27.7 ml LV SV MOD A4C:     64.8 ml LV SV MOD BP:      30.0 ml RIGHT VENTRICLE             IVC RV S prime:     14.40 cm/s  IVC diam: 2.10 cm LEFT ATRIUM             Index        RIGHT ATRIUM           Index LA diam:        3.70 cm 1.90 cm/m   RA Area:     11.20 cm LA Vol (A2C):   39.2 ml 20.17 ml/m  RA Volume:   24.30 ml  12.51 ml/m LA Vol (A4C):   45.9 ml 23.62 ml/m LA Biplane Vol: 42.9 ml 22.08 ml/m  AORTIC VALVE                     PULMONIC VALVE AV Area (Vmax):    2.32 cm      PV Vmax:       0.52 m/s AV Area  (Vmean):   2.24 cm      PV Vmean:      34.900 cm/s AV Area (VTI):     2.46 cm      PV VTI:        0.110 m AV Vmax:           175.00 cm/s   PV Peak grad:  1.1 mmHg AV Vmean:          115.000 cm/s  PV Mean grad:  1.0 mmHg AV VTI:            0.278 m AV Peak Grad:      12.2 mmHg AV Mean Grad:      6.0 mmHg LVOT Vmax:         129.00 cm/s LVOT Vmean:        81.900 cm/s LVOT VTI:          0.218 m LVOT/AV VTI ratio: 0.78  AORTA Ao Root diam: 3.20 cm Ao Asc diam:  3.10 cm MITRAL VALVE               TRICUSPID VALVE MV Area (PHT): 4.68 cm    TR Peak grad:   27.7 mmHg MV Decel Time: 162 msec    TR Vmax:        263.00 cm/s MV E velocity: 66.20 cm/s MV A velocity: 62.35 cm/s  SHUNTS MV E/A ratio:  1.06        Systemic VTI:  0.22 m  Systemic Diam: 2.00 cm Eleonore Chiquito MD Electronically signed by Eleonore Chiquito MD Signature Date/Time: 04/26/2021/9:37:53 AM    Final      Assessment and Plan:   Chest Pain  - Pain is described as pressure and is located in the center of her chest. No aggravating or alleviating factors  - HSTN 17>>16>>19. BNP 77.3   - EKG with nonspecific ST changes (ST depression in leads V3 and V4) - Echo this admission showed EF 55-60%, normal LV/RV systolic function, normal LV diastolic parameters  - Hesitant to perform coronary CT as patient has poor renal function. Patient is not having ACS and does not require Cath at this time (patient would be a poor cath candidate regardless due to kidney function, dementia)   - Will proceed with nuclear stress test to be completed tomorrow. NPO at midnight.  - Continue metoprolol, ASA, lipitor.  - Will order lipid panel, A1c to assess risk factors.   AKI on CKD stage 4  - Baseline creatinine 1.75 - Creatinine 2.16 this AM  - Avoid nephrotoxic medications  HTN  - On Maxzide, metoprolol  - BP well controlled on this regiment  - ARB held due to AKI   History of TBI, dementia  - Managed per primary team      Risk  Assessment/Risk Scores:   HEAR Score (for undifferentiated chest pain):  HEAR Score: 4{       For questions or updates, please contact [place] Please consult [email or web address] for contact info under    Signed, Margie Billet, PA-C  04/26/2021 1:53 PM   Patient seen and examined. Agree with assessment and plan.  Deborah Webb is a 71 year old African-American female who sustained a traumatic brain injury as a teenager.  She has a history of hypertension, hyperlipidemia, seizure disorder, as well as dementia.  She is unaware of any significant coronary artery disease.  Over the past week, she has noticed intermittent nonexertional episodes of vague chest sensation.  At times these are sharp.  At other times it is just there.  It is not exertional.  She presented to urgent care yesterday.  On exam, she is in no acute distress and comfortable.  Her blood pressure was 130/87.  O2 saturation was normal.  She was wearing a wig.  HEENT was unremarkable.  There was no JVD no hepatojugular reflux.  Her lungs were clear.  She did not have chest wall tenderness.  Abdomen was nontender with positive bowel sounds.  There was no edema.  Her initial ECG has shown sinus rhythm with inverted T waves inferiorly as well as anterolaterally.  Creatinine is 2.13 consistent with stage IV CKD with estimated GFR 24.  Chest x-ray suggested possible mild congestive heart failure.  She underwent an echo Doppler study today which showed normal systolic function with EF estimated 55 to 60%, normal RV function, left ventricular hypertrophy and normal diastolic parameters.  Valves were essentially normal.  Her ECG today shows slight improvement in her previous T wave abnormality inferiorly and continues to show anterolateral ST changes which also are improved.  Troponins are essentially negative at 17 > 16 > 19.  Agree with plans for admission.  With her ECG changes and CKD, recommend initial ischemic screening with a Lexiscan  Myoview study.  We will arrange for this to be done tomorrow.  The patient has eaten lunch today.  I described the procedure to her in detail and she consents to undergo this evaluation.  We will check  fasting lipid panel in a.m. and follow-up chemistry and CBC.  With MCV at 105 consider checking B12 and folate levels.  We will follow.   Troy Sine, MD, [facility] 04/26/2021 2:20 PM

## 2021-04-26 NOTE — H&P (Signed)
History and Physical    Patient: Deborah Webb BDZ:[id number] DOB: 06/20/1950 DOA: 04/25/2021 DOS: the patient was seen and examined on 04/26/2021 PCP: Vincente Liberty, MD  Patient coming from: Home  Chief Complaint:  Chief Complaint  Patient presents with   Weakness    HPI: ORVA RILES is a 71 y.o. female with medical history significant of TBI as teenager, HTN, HLD, seizure disorder, dementia who was sent to the emergency room from a local urgent care for possible STEMI.  Patient reports has been having intermittent chest pain for the last few days.  She is a poor historian secondary to previous traumatic brain injury and history of dementia.  She does report having chest pressure in the center of her chest that does not radiate and is associated with feeling short of breath and fatigue.  She has not had any vomiting although she states she did feel nauseated earlier.  She does not know of anything that brings on the pain.  She had an episode of pain this morning that was resolved by 11 AM.  She has not had any cough or congestion.  Denies any abdominal pain or diarrhea.  At the urgent care they were concerned about possible STEMI so they sent her to the ER for further evaluation.  In the emergency room she been hemodynamically stable.  EKG shows some ST depression in V3 and V4 which is new from previous EKGs.  Her troponin was negative x2 in the emergency room.  Hospitalist service was asked to admit for further management.  Her creatinine level is mildly increased from previous level but she still has CKD stage IV on this lab and her previous renal labs. Lives with her husband.  No tobacco alcohol or illicit drug use.  Review of Systems: Unable to obtain accurate review of systems secondary to dementia Past Medical History:  Diagnosis Date   Hyperlipidemia    Hypertension    Seizures ([place])    Past Surgical History:  Procedure Laterality Date   BRAIN SURGERY  1968   BREAST  CYST ASPIRATION Right    Social History:  reports that she has never smoked. She has never used smokeless tobacco. She reports that she does not drink alcohol and does not use drugs.  No Known Allergies  Family History  Problem Relation Age of Onset   Breast cancer Mother    Other Father        unsure of medical history    Prior to Admission medications   Medication Sig Start Date End Date Taking? Authorizing Provider  aspirin EC 81 MG tablet Take 81 mg by mouth daily.    [provider]  atorvastatin (LIPITOR) 10 MG tablet Take 10 mg by mouth every morning.     [provider]  BYSTOLIC 10 MG tablet TK 1 T PO QD 12/04/18   [provider]  donepezil (ARICEPT) 10 MG tablet Take 1 tablet (10 mg total) by mouth at bedtime. 04/19/21   Suzzanne Cloud, NP  lamoTRIgine (LAMICTAL) 100 MG tablet Take 1 tablet (100 mg total) by mouth 2 (two) times daily. 12/30/20   Marcial Pacas, MD  losartan (COZAAR) 100 MG tablet Take 50 mg by mouth daily.    [provider]  memantine (NAMENDA) 10 MG tablet Take 1 tablet (10 mg total) by mouth 2 (two) times daily. 02/17/21   Marcial Pacas, MD  metoprolol tartrate (LOPRESSOR) 50 MG tablet Take 50 mg by mouth 2 (two) times  daily. 12/28/20   [provider]  Multiple Vitamins-Minerals (MULTIVITAMIN PO) Take 1 tablet by mouth every Monday.     [provider]  QUEtiapine (SEROQUEL) 50 MG tablet Take 1 tablet (50 mg total) by mouth at bedtime. 12/30/20   Marcial Pacas, MD  triamterene-hydrochlorothiazide (MAXZIDE-25) 37.5-25 MG per tablet Take 1 tablet by mouth daily.    [provider]    Physical Exam: Vitals:   04/25/21 2215 04/25/21 2230 04/25/21 2245 04/25/21 2300  BP: 128/78 130/80 127/84 139/85  Pulse: 63 62 65 73  Resp: 17 16 (!) 22 19  Temp:      TempSrc:      SpO2: 98% 98% 97% 96%  Weight:      Height:       General: WDWN, Alert and oriented to person and place.  Eyes: EOMI, PERRL, conjunctivae  normal.  Sclera nonicteric HENT:  /AT, external ears normal.  Nares patent without epistasis.  Mucous membranes are moist Neck: Soft, normal range of motion, supple, no masses, Trachea midline Respiratory: clear to auscultation bilaterally, no wheezing, no crackles. Normal respiratory effort. No accessory muscle use.  Cardiovascular: Regular rate and rhythm, no murmurs / rubs / gallops. No extremity edema. 2+ pedal pulses.  Abdomen: Soft, no tenderness, nondistended, no rebound or guarding.  No masses palpated. Bowel sounds normoactive Musculoskeletal: FROM. no cyanosis. No joint deformity upper and lower extremities. Normal muscle tone.  Skin: Warm, dry, intact no rashes, lesions, ulcers. No induration Neurologic: CN 2-12 grossly intact.  Normal speech.  Sensation intact, Strength 5/5 in all extremities. No tremor Psychiatric: Normal mood.    Data Reviewed: Lab work shows WBC of 9200 hemoglobin 1.9 hematocrit 35.9 platelets 203,000, BNP 77.3.  Initial troponin 17.  Repeat troponin 16. Sodium 138 potassium 3.2 chloride 102 bicarb 22 creatinine 2.18 from baseline of 1.75 BUN 32 glucose 101 magnesium 2.4 albumin 3.0 alkaline phosphatase 45 AST 19 ALT 15 bilirubin 0.5 GFR 24 INR 1.1 TSH 1.48 COVID-negative.  Influenza A and B negative  Chest x-ray shows small bilateral pleural effusions no pneumothorax no infiltrate or consolidations.  Enlarged cardiac silhouette.  Mild interstitial opacities consistent with CHF  EKG shows normal sinus rhythm with ST depression in V3 and V4 that is mild and new from previous EKGs.  QTc 419  Assessment and Plan: * Chest pain- (present on admission) Ms. Printz placed on cardiac telemetry for observation for chest pain.  Obtain serial troponin levels.  If troponins remain negative we will proceed with stress test.  If troponins become positive will consult cardiology.  Antiplatelet therapy with aspirin daily.  Check lipid panel.  Continue statin therapy.  Monitor  blood pressure.  Nitroglycerin as needed. Supplemental oxygen as needed to maintain O2 sat between 92-96%   CKD (chronic kidney disease) stage 4, GFR 15-29 ml/min (HCC) Stable CKD.  Creatinine mildly elevated from previous level of 1.75 but GFR stable. Recheck renal function on labs in am  Hypokalemia- (present on admission) Replete potassium  Magnesium level is normal.  Recheck electrolytes in am   Essential hypertension Continue beta blocker, ARB, maxzide. Monitor BP  Dementia ([place])- (present on admission) Continue aricept and namenda   Advance Care Planning: Code Status:  Full Code;  Heparin for DVT prophylaxis  Family Communication: Diagnosis and plan discussed with patient and her husband who is at bedside.  Questions answered.  They agree with plan.  Further recommendation follow as clinical indicated  Author: Eben Burow, MD 04/26/2021 12:07  AM  For on call review [email or web address].

## 2021-04-26 NOTE — Care Management Obs Status (Signed)
[place] NOTIFICATION   Patient Details  Name: Deborah Webb MRN: [id number] Date of Birth: 06-Apr-1950   Medicare Observation Status Notification Given:  Ernesta Amble, RN 04/26/2021, 6:36 PM

## 2021-04-27 ENCOUNTER — Observation Stay (HOSPITAL_BASED_OUTPATIENT_CLINIC_OR_DEPARTMENT_OTHER): Payer: Medicare PPO

## 2021-04-27 DIAGNOSIS — R079 Chest pain, unspecified: Secondary | ICD-10-CM | POA: Diagnosis not present

## 2021-04-27 DIAGNOSIS — R072 Precordial pain: Secondary | ICD-10-CM | POA: Diagnosis not present

## 2021-04-27 DIAGNOSIS — N184 Chronic kidney disease, stage 4 (severe): Secondary | ICD-10-CM | POA: Diagnosis not present

## 2021-04-27 LAB — BASIC METABOLIC PANEL
Anion gap: 9 (ref 5–15)
BUN: 27 mg/dL — ABNORMAL HIGH (ref 8–23)
CO2: 26 mmol/L (ref 22–32)
Calcium: 8.5 mg/dL — ABNORMAL LOW (ref 8.9–10.3)
Chloride: 102 mmol/L (ref 98–111)
Creatinine, Ser: 1.69 mg/dL — ABNORMAL HIGH (ref 0.44–1.00)
GFR, Estimated: 32 mL/min — ABNORMAL LOW (ref >60)
Glucose, Bld: 111 mg/dL — ABNORMAL HIGH (ref 70–99)
Potassium: 3.9 mmol/L (ref 3.5–5.1)
Sodium: 137 mmol/L (ref 135–145)

## 2021-04-27 LAB — MAGNESIUM: Magnesium: 2.2 mg/dL (ref 1.7–2.4)

## 2021-04-27 LAB — LIPID PANEL
Cholesterol: 117 mg/dL (ref 0–200)
HDL: 42 mg/dL (ref >40)
LDL Cholesterol: 67 mg/dL (ref 0–99)
Total CHOL/HDL Ratio: 2.8 RATIO
Triglycerides: 38 mg/dL (ref <150)
VLDL: 8 mg/dL (ref 0–40)

## 2021-04-27 LAB — NM MYOCAR MULTI W/SPECT W/WALL MOTION / EF: ST Depression (mm): 0 mm

## 2021-04-27 LAB — HEMOGLOBIN A1C
Hgb A1c MFr Bld: 5.4 % (ref 4.8–5.6)
Mean Plasma Glucose: 108.28 mg/dL

## 2021-04-27 MED ORDER — TECHNETIUM TC 99M TETROFOSMIN IV KIT
29.7000 | PACK | Freq: Once | INTRAVENOUS | Status: AC | PRN
  Administered 2021-04-27: 29.7 via INTRAVENOUS

## 2021-04-27 MED ORDER — REGADENOSON 0.4 MG/5ML IV SOLN
INTRAVENOUS | Status: AC
  Administered 2021-04-27: 0.4 mg via INTRAVENOUS
  Filled 2021-04-27: qty 5

## 2021-04-27 MED ORDER — TECHNETIUM TC 99M TETROFOSMIN IV KIT
10.8000 | PACK | Freq: Once | INTRAVENOUS | Status: AC | PRN
  Administered 2021-04-27: 10.8 via INTRAVENOUS

## 2021-04-27 MED ORDER — REGADENOSON 0.4 MG/5ML IV SOLN
0.4000 mg | Freq: Once | INTRAVENOUS | Status: AC
  Filled 2021-04-27: qty 5

## 2021-04-27 MED ORDER — LACTATED RINGERS IV SOLN: INTRAVENOUS | Status: DC

## 2021-04-27 NOTE — Discharge Instructions (Signed)
Follow with Primary MD Vincente Liberty, MD in 7 days   Get CBC, CMP, 2 view Chest X ray -  checked next visit within 1 week by Primary MD   Activity: As tolerated with Full fall precautions use walker/cane & assistance as needed  Disposition Home   Diet: Heart Healthy  Special Instructions: If you have smoked or chewed Tobacco  in the last 2 yrs please stop smoking, stop any regular Alcohol  and or any Recreational drug use.  On your next visit with your primary care physician please Get Medicines reviewed and adjusted.  Please request your Prim.MD to go over all Hospital Tests and Procedure/Radiological results at the follow up, please get all Hospital records sent to your Prim MD by signing hospital release before you go home.  If you experience worsening of your admission symptoms, develop shortness of breath, life threatening emergency, suicidal or homicidal thoughts you must seek medical attention immediately by calling 911 or calling your MD immediately  if symptoms less severe.  You Must read complete instructions/literature along with all the possible adverse reactions/side effects for all the Medicines you take and that have been prescribed to you. Take any new Medicines after you have completely understood and accpet all the possible adverse reactions/side effects.

## 2021-04-27 NOTE — Discharge Summary (Addendum)
Deborah Webb ILN:[id number] DOB: [birth year] DOA: 04/25/2021  PCP: Vincente Liberty, MD  Admit date: 04/25/2021  Discharge date: 04/27/2021  Admitted From: Home   Disposition:  Home   Recommendations for Outpatient Follow-up:   Follow up with PCP in 1-2 weeks  PCP Please obtain BMP/CBC, 2 view CXR in 1week,  (see Discharge instructions)   PCP Please follow up on the following pending results:    Home Health: None   Equipment/Devices: None  Consultations: Cards Discharge Condition: Stable    CODE STATUS: Full    Diet Recommendation: Heart Healthy   Diet Order             Diet Heart Room service appropriate? Yes; Fluid consistency: Thin  Diet effective now           Diet - low sodium heart healthy                    Chief Complaint  Patient presents with   Weakness     Brief history of present illness from the day of admission and additional interim summary    [name] is a 71 y.o. female with medical history significant of TBI as teenager, HTN, HLD, seizure disorder, dementia who was sent to the emergency room from a local urgent care for possible STEMI.    She apparently was at home and started having left-sided chest pain going to her arm making her nauseated and short of breath yesterday, went to urgent care where her EKG was suspicious for STEMI and she was sent to [facility].  Here EKG showed nonspecific ST changes troponin was flat and she was kept in the hospital for further care.                                                                 Hospital Course    Chest pain likely musculoskeletal - she had initial suspicion of STEMI at an urgent care, EKG here has nonspecific changes, troponin is flat, she is currently chest pain-free, on aspirin and statin along with  beta-blocker.  Was seen by Cards, stable TTE and Cardiolite( Dr. Johnsie Cancel), DC home with PCP and Cards follow up, symptom free now.   2.  AKI on CKD 4 baseline creatinine around 1.75.  resolved after hydration and holding ARB x 2 days, PCP to monitor.   3.  History of TBI in the remote past along with some dementia.  Continue Aricept and Namenda. Stable here.   4.  Essential hypertension.  On combination of beta-blocker and diuretic here, ARB PCP to monitor.   5.  Hypokalemia.  Replaced and stable.    Discharge diagnosis     Principal Problem:   Chest pain Active Problems:   Hypokalemia   Dementia ([place])  History of traumatic brain injury   CKD (chronic kidney disease) stage 4, GFR 15-29 ml/min (HCC)   Essential hypertension    Discharge instructions    Discharge Instructions     Diet - low sodium heart healthy   Complete by: As directed    Discharge instructions   Complete by: As directed    Follow with Primary MD Vincente Liberty, MD in 7 days   Get CBC, CMP, 2 view Chest X ray -  checked next visit within 1 week by Primary MD   Activity: As tolerated with Full fall precautions use walker/cane & assistance as needed  Disposition Home   Diet: Heart Healthy  Special Instructions: If you have smoked or chewed Tobacco  in the last 2 yrs please stop smoking, stop any regular Alcohol  and or any Recreational drug use.  On your next visit with your primary care physician please Get Medicines reviewed and adjusted.  Please request your Prim.MD to go over all Hospital Tests and Procedure/Radiological results at the follow up, please get all Hospital records sent to your Prim MD by signing hospital release before you go home.  If you experience worsening of your admission symptoms, develop shortness of breath, life threatening emergency, suicidal or homicidal thoughts you must seek medical attention immediately by calling 911 or calling your MD immediately  if symptoms less  severe.  You Must read complete instructions/literature along with all the possible adverse reactions/side effects for all the Medicines you take and that have been prescribed to you. Take any new Medicines after you have completely understood and accpet all the possible adverse reactions/side effects.   Increase activity slowly   Complete by: As directed        Discharge Medications   Allergies as of 04/27/2021   No Known Allergies      Medication List     STOP taking these medications    Bystolic 10 MG tablet Generic drug: nebivolol       TAKE these medications    aspirin EC 81 MG tablet Take 81 mg by mouth daily.   atorvastatin 10 MG tablet Commonly known as: LIPITOR Take 10 mg by mouth every morning.   donepezil 10 MG tablet Commonly known as: ARICEPT Take 1 tablet (10 mg total) by mouth at bedtime.   lamoTRIgine 100 MG tablet Commonly known as: LaMICtal Take 1 tablet (100 mg total) by mouth 2 (two) times daily.   losartan 100 MG tablet Commonly known as: COZAAR Take 50 mg by mouth daily.   memantine 10 MG tablet Commonly known as: Namenda Take 1 tablet (10 mg total) by mouth 2 (two) times daily.   metoprolol tartrate 50 MG tablet Commonly known as: LOPRESSOR Take 50 mg by mouth 2 (two) times daily.   MULTIVITAMIN PO Take 1 tablet by mouth every Monday.   QUEtiapine 50 MG tablet Commonly known as: SEROquel Take 1 tablet (50 mg total) by mouth at bedtime.   triamterene-hydrochlorothiazide 37.5-25 MG tablet Commonly known as: MAXZIDE-25 Take 1 tablet by mouth daily.         Follow-up Information     Vincente Liberty, MD. Schedule an appointment as soon as possible for a visit in 1 week(s).   Specialty: Pulmonary Disease Contact information: [place] [address] [phone]         Troy Sine, MD. Schedule an appointment as soon as possible for a visit in 1 week(s).   Specialty: Cardiology Contact  information: [address] [place] [address] [phone]                 Major procedures and Radiology Reports - PLEASE review detailed and final reports thoroughly  -      NM Myocar Multi W/Spect W/Wall Motion / EF  Result Date: 04/27/2021   The study is normal. The study is low risk.   No ST deviation was noted.   LV perfusion is normal. There is no evidence of ischemia. There is no evidence of infarction.   Left ventricular function is normal. The left ventricular ejection fraction is normal (55-65%). End diastolic cavity size is normal. End systolic cavity size is normal. Normal resting and stress perfusion. No ischemia or infarction EF 62%   DG Chest Port 1 View  Result Date: 04/25/2021 CLINICAL DATA:  Chest pain, weakness EXAM: PORTABLE CHEST 1 VIEW COMPARISON:  09/07/2005 FINDINGS: Single frontal view of the chest demonstrates an enlarged cardiac silhouette. Basilar predominant interstitial opacities and airspace disease, with small bilateral pleural effusions, most consistent with CHF. No pneumothorax. No acute bony abnormalities. IMPRESSION: 1. Findings consistent with mild congestive heart failure. Electronically Signed   By: Randa Ngo M.D.   On: 04/25/2021 18:19   ECHOCARDIOGRAM COMPLETE  Result Date: 04/26/2021    ECHOCARDIOGRAM REPORT   Patient Name:   Deborah Webb Date of Exam: 04/26/2021 Medical Rec #:  [id number]          Height:       71.0 in Accession #:    [id number]         Weight:       165.0 lb Date of Birth:  [birth year]          BSA:          1.943 m Patient Age:    71 years           BP:           121/82 mmHg Patient Gender: F                  HR:           87 bpm. Exam Location:  Inpatient Procedure: 2D Echo, Cardiac Doppler and Color Doppler Indications:    R07.9 CHEST PAIN  History:        Patient has prior history of Echocardiogram examinations, most                 recent 08/20/2016. Risk Factors:Hypertension and Dyslipidemia.                  SEIZURES.  Sonographer:    Beryle Beams Referring Phys: [place] K Mark  1. Left ventricular ejection fraction, by estimation, is 55 to 60%. The left ventricle has normal function. The left ventricle has no regional wall motion abnormalities. There is mild concentric left ventricular hypertrophy. Left ventricular diastolic parameters were normal.  2. Right ventricular systolic function is normal. The right ventricular size is normal. There is normal pulmonary artery systolic pressure. The estimated right ventricular systolic pressure is 83.4 mmHg.  3. The mitral valve is grossly normal. No evidence of mitral valve regurgitation. No evidence of mitral stenosis.  4. The aortic valve is grossly normal. Aortic valve regurgitation is not visualized. No aortic stenosis is present.  5. The inferior vena cava is normal in size with greater than 50% respiratory variability, suggesting right atrial pressure of 3 mmHg. Conclusion(s)/Recommendation(s): Normal biventricular function  without evidence of hemodynamically significant valvular heart disease. FINDINGS  Left Ventricle: Left ventricular ejection fraction, by estimation, is 55 to 60%. The left ventricle has normal function. The left ventricle has no regional wall motion abnormalities. The left ventricular internal cavity size was normal in size. There is  mild concentric left ventricular hypertrophy. Left ventricular diastolic parameters were normal. Right Ventricle: The right ventricular size is normal. No increase in right ventricular wall thickness. Right ventricular systolic function is normal. There is normal pulmonary artery systolic pressure. The tricuspid regurgitant velocity is 2.63 m/s, and  with an assumed right atrial pressure of 3 mmHg, the estimated right ventricular systolic pressure is 24.0 mmHg. Left Atrium: Left atrial size was normal in size. Right Atrium: Right atrial size was normal in size. Pericardium: There is no  evidence of pericardial effusion. Mitral Valve: The mitral valve is grossly normal. No evidence of mitral valve regurgitation. No evidence of mitral valve stenosis. Tricuspid Valve: The tricuspid valve is grossly normal. Tricuspid valve regurgitation is trivial. No evidence of tricuspid stenosis. Aortic Valve: The aortic valve is grossly normal. Aortic valve regurgitation is not visualized. No aortic stenosis is present. Aortic valve mean gradient measures 6.0 mmHg. Aortic valve peak gradient measures 12.2 mmHg. Aortic valve area, by VTI measures  2.46 cm. Pulmonic Valve: The pulmonic valve was grossly normal. Pulmonic valve regurgitation is not visualized. No evidence of pulmonic stenosis. Aorta: The aortic root and ascending aorta are structurally normal, with no evidence of dilitation. Venous: The inferior vena cava is normal in size with greater than 50% respiratory variability, suggesting right atrial pressure of 3 mmHg. IAS/Shunts: The atrial septum is grossly normal.  LEFT VENTRICLE PLAX 2D LVIDd:         4.30 cm     Diastology LVIDs:         2.20 cm     LV e' medial:    7.62 cm/s LV PW:         1.40 cm     LV E/e' medial:  8.7 LV IVS:        1.40 cm     LV e' lateral:   4.79 cm/s LVOT diam:     2.00 cm     LV E/e' lateral: 13.8 LV SV:         68 LV SV Index:   35 LVOT Area:     3.14 cm  LV Volumes (MOD) LV vol d, MOD A2C: 50.9 ml LV vol d, MOD A4C: 64.8 ml LV vol s, MOD A2C: 23.2 ml LV vol s, MOD A4C: 31.0 ml LV SV MOD A2C:     27.7 ml LV SV MOD A4C:     64.8 ml LV SV MOD BP:      30.0 ml RIGHT VENTRICLE             IVC RV S prime:     14.40 cm/s  IVC diam: 2.10 cm LEFT ATRIUM             Index        RIGHT ATRIUM           Index LA diam:        3.70 cm 1.90 cm/m   RA Area:     11.20 cm LA Vol (A2C):   39.2 ml 20.17 ml/m  RA Volume:   24.30 ml  12.51 ml/m LA Vol (A4C):   45.9 ml 23.62 ml/m LA Biplane Vol: 42.9 ml 22.08 ml/m  AORTIC VALVE                     PULMONIC VALVE AV Area (Vmax):    2.32 cm       PV Vmax:       0.52 m/s AV Area (Vmean):   2.24 cm      PV Vmean:      34.900 cm/s AV Area (VTI):     2.46 cm      PV VTI:        0.110 m AV Vmax:           175.00 cm/s   PV Peak grad:  1.1 mmHg AV Vmean:          115.000 cm/s  PV Mean grad:  1.0 mmHg AV VTI:            0.278 m AV Peak Grad:      12.2 mmHg AV Mean Grad:      6.0 mmHg LVOT Vmax:         129.00 cm/s LVOT Vmean:        81.900 cm/s LVOT VTI:          0.218 m LVOT/AV VTI ratio: 0.78  AORTA Ao Root diam: 3.20 cm Ao Asc diam:  3.10 cm MITRAL VALVE               TRICUSPID VALVE MV Area (PHT): 4.68 cm    TR Peak grad:   27.7 mmHg MV Decel Time: 162 msec    TR Vmax:        263.00 cm/s MV E velocity: 66.20 cm/s MV A velocity: 62.35 cm/s  SHUNTS MV E/A ratio:  1.06        Systemic VTI:  0.22 m                            Systemic Diam: 2.00 cm Eleonore Chiquito MD Electronically signed by Eleonore Chiquito MD Signature Date/Time: 04/26/2021/9:37:53 AM    Final      Today   Subjective    Glynda Jaeger today has no headache,no chest abdominal pain,no new weakness tingling or numbness, feels much better wants to go home today.    Objective   Blood pressure (!) 141/86, pulse 75, temperature 97.8 F (36.6 C), temperature source Oral, resp. rate (!) 21, height 5\' 11"  (1.803 m), weight 80.6 kg, SpO2 96 %.   Intake/Output Summary (Last 24 hours) at 04/27/2021 1623 Last data filed at 04/27/2021 1445 Gross per 24 hour  Intake 0 ml  Output 750 ml  Net -750 ml    Exam  Awake Alert, No new F.N deficits,    [place].AT,PERRAL Supple Neck,   Symmetrical Chest wall movement, Good air movement bilaterally, CTAB RRR,No Gallops,   +ve B.Sounds, Abd Soft, Non tender,  No Cyanosis, Clubbing or edema    Data Review   CBC w Diff:  Lab Results  Component Value Date   WBC 5.7 04/26/2021   HGB 12.9 04/26/2021   HGB 12.4 12/30/2020   HCT 43.4 04/26/2021   HCT 35.9 12/30/2020   PLT 183 04/26/2021   PLT 197 12/30/2020   LYMPHOPCT 22 04/25/2021    MONOPCT 14 04/25/2021   EOSPCT 2 04/25/2021   BASOPCT 0 04/25/2021    CMP:  Lab Results  Component Value Date   NA 137 04/27/2021   NA 143 12/30/2020   K 3.9 04/27/2021   CL  102 04/27/2021   CO2 26 04/27/2021   BUN 27 (H) 04/27/2021   BUN 31 (H) 12/30/2020   CREATININE 1.69 (H) 04/27/2021   CREATININE 1.40 (H) 05/08/2011   PROT 7.2 04/25/2021   PROT 7.1 12/30/2020   ALBUMIN 3.0 (L) 04/25/2021   ALBUMIN 4.8 12/30/2020   BILITOT 0.5 04/25/2021   BILITOT 0.3 12/30/2020   ALKPHOS 45 04/25/2021   AST 19 04/25/2021   ALT 15 04/25/2021  . Lab Results  Component Value Date   CHOL 117 04/27/2021   HDL 42 04/27/2021   LDLCALC 67 04/27/2021   TRIG 38 04/27/2021   CHOLHDL 2.8 04/27/2021    Lab Results  Component Value Date   HGBA1C 5.4 04/27/2021     Total Time in preparing paper work, data evaluation and todays exam - 28 minutes  Lala Lund M.D on 04/27/2021 at 4:23 PM  [facility]

## 2021-04-27 NOTE — Progress Notes (Signed)
° °  Deborah Webb presented for a nuclear stress test today.  No immediate complications.  Stress imaging is pending at this time.  Preliminary EKG findings may be listed in the chart, but the stress test result will not be finalized until perfusion imaging is complete.  Tami Lin Vondell Babers, PA-C 04/27/2021, 11:17 AM

## 2021-04-27 NOTE — TOC Progression Note (Signed)
Transition of Care [facility]) - Progression Note    Patient Details  Name: Deborah Webb MRN: [id number] Date of Birth: 01/08/1951  Transition [facility] CM/SW Contact  Zenon Mayo, RN Phone Number: 04/27/2021, 9:52 AM  Clinical Narrative:    From Home with spouse who is the patient's Legal Guardian, TBI in teenage years, slight dementia, admitted with NSTEMI, for stress test today, if negative may be able to dc home today.  TOC will continue to follow for dc.         Expected Discharge Plan and Services                                                 Social Determinants of Health (SDOH) Interventions Food Insecurity Interventions: Intervention Not Indicated Housing Interventions: Intervention Not Indicated Transportation Interventions:  (No interventions needed)  Readmission Risk Interventions No flowsheet data found.

## 2021-04-27 NOTE — Progress Notes (Signed)
Progress Note  Patient Name: Deborah Webb Date of Encounter: 04/27/2021  Primary Cardiologist: Shelva Majestic, MD   Subjective   Plans for nuclear stress test today.  Inpatient Medications    Scheduled Meds:  aspirin EC  81 mg Oral Daily   atorvastatin  10 mg Oral [id number]   donepezil  10 mg Oral QHS   heparin  5,000 Units Subcutaneous Q8H   memantine  10 mg Oral BID   metoprolol tartrate  50 mg Oral BID   Continuous Infusions:  lactated ringers 75 mL/hr at 04/27/21 0748   PRN Meds: acetaminophen **OR** acetaminophen, ondansetron **OR** ondansetron (ZOFRAN) IV, senna-docusate   Vital Signs    Vitals:   04/26/21 2206 04/27/21 0058 04/27/21 0428 04/27/21 0808  BP: (!) 119/97 (!) 124/94 (!) 128/94 130/82  Pulse: 84 83 76 73  Resp:  20 20 19   Temp:  98.9 F (37.2 C) 99.2 F (37.3 C) 98.8 F (37.1 C)  TempSrc:  Oral Oral Oral  SpO2: 98% 94% 93% 98%  Weight:   80.6 kg   Height:        Intake/Output Summary (Last 24 hours) at 04/27/2021 1030 Last data filed at 04/27/2021 0431 Gross per 24 hour  Intake --  Output 750 ml  Net -750 ml   Filed Weights   04/25/21 1800 04/27/21 0428  Weight: 74.8 kg 80.6 kg    Telemetry    Sinus rhyth - Personally Reviewed  ECG    None today - Personally Reviewed  Physical Exam     General: Comfortable Head: Atraumatic, normal size  Eyes: PEERLA, EOMI  Neck: Supple, normal JVD Cardiac: Normal S1, S2; RRR; no murmurs, rubs, or gallops Lungs: Clear to auscultation bilaterally Abd: Soft, nontender, no hepatomegaly  Ext: warm, no edema Musculoskeletal: No deformities, BUE and BLE strength normal and equal Skin: Warm and dry, no rashes   Neuro: Alert and oriented to person, place, time, and situation, CNII-XII grossly intact, no focal deficits  Psych: Normal mood and affect   Labs    Chemistry Recent Labs  Lab 04/25/21 1817 04/26/21 0217 04/27/21 0447  NA 138 139 137  K 3.2* 3.9 3.9  CL 102 104 102  CO2 22  19* 26  GLUCOSE 101* 145* 111*  BUN 32* 33* 27*  CREATININE 2.18* 2.16* 1.69*  CALCIUM 8.8* 8.8* 8.5*  PROT 7.2  --   --   ALBUMIN 3.0*  --   --   AST 19  --   --   ALT 15  --   --   ALKPHOS 45  --   --   BILITOT 0.5  --   --   GFRNONAA 24* 24* 32*  ANIONGAP 14 16* 9     Hematology Recent Labs  Lab 04/25/21 1817 04/26/21 0217  WBC 9.2 5.7  RBC 3.78* 4.13  HGB 11.9* 12.9  HCT 35.9* 43.4  MCV 95.0 105.1*  MCH 31.5 31.2  MCHC 33.1 29.7*  RDW 13.5 13.7  PLT 203 183    Cardiac EnzymesNo results for input(s): TROPONINI in the last 168 hours. No results for input(s): TROPIPOC in the last 168 hours.   BNP Recent Labs  Lab 04/25/21 2042  BNP 77.3     DDimer No results for input(s): DDIMER in the last 168 hours.   Radiology    DG Chest Port 1 View  Result Date: 04/25/2021 CLINICAL DATA:  Chest pain, weakness EXAM: PORTABLE CHEST 1 VIEW COMPARISON:  09/07/2005 FINDINGS:  Single frontal view of the chest demonstrates an enlarged cardiac silhouette. Basilar predominant interstitial opacities and airspace disease, with small bilateral pleural effusions, most consistent with CHF. No pneumothorax. No acute bony abnormalities. IMPRESSION: 1. Findings consistent with mild congestive heart failure. Electronically Signed   By: Randa Ngo M.D.   On: 04/25/2021 18:19   ECHOCARDIOGRAM COMPLETE  Result Date: 04/26/2021    ECHOCARDIOGRAM REPORT   Patient Name:   Deborah Webb Date of Exam: 04/26/2021 Medical Rec #:  [id number]          Height:       71.0 in Accession #:    [id number]         Weight:       165.0 lb Date of Birth:  1950-09-23          BSA:          1.943 m Patient Age:    71 years           BP:           121/82 mmHg Patient Gender: F                  HR:           87 bpm. Exam Location:  Inpatient Procedure: 2D Echo, Cardiac Doppler and Color Doppler Indications:    R07.9 CHEST PAIN  History:        Patient has prior history of Echocardiogram examinations, most                  recent 08/20/2016. Risk Factors:Hypertension and Dyslipidemia.                 SEIZURES.  Sonographer:    Beryle Beams Referring Phys: [place] K Ansley  1. Left ventricular ejection fraction, by estimation, is 55 to 60%. The left ventricle has normal function. The left ventricle has no regional wall motion abnormalities. There is mild concentric left ventricular hypertrophy. Left ventricular diastolic parameters were normal.  2. Right ventricular systolic function is normal. The right ventricular size is normal. There is normal pulmonary artery systolic pressure. The estimated right ventricular systolic pressure is 23.5 mmHg.  3. The mitral valve is grossly normal. No evidence of mitral valve regurgitation. No evidence of mitral stenosis.  4. The aortic valve is grossly normal. Aortic valve regurgitation is not visualized. No aortic stenosis is present.  5. The inferior vena cava is normal in size with greater than 50% respiratory variability, suggesting right atrial pressure of 3 mmHg. Conclusion(s)/Recommendation(s): Normal biventricular function without evidence of hemodynamically significant valvular heart disease. FINDINGS  Left Ventricle: Left ventricular ejection fraction, by estimation, is 55 to 60%. The left ventricle has normal function. The left ventricle has no regional wall motion abnormalities. The left ventricular internal cavity size was normal in size. There is  mild concentric left ventricular hypertrophy. Left ventricular diastolic parameters were normal. Right Ventricle: The right ventricular size is normal. No increase in right ventricular wall thickness. Right ventricular systolic function is normal. There is normal pulmonary artery systolic pressure. The tricuspid regurgitant velocity is 2.63 m/s, and  with an assumed right atrial pressure of 3 mmHg, the estimated right ventricular systolic pressure is 36.1 mmHg. Left Atrium: Left atrial size was normal in size. Right  Atrium: Right atrial size was normal in size. Pericardium: There is no evidence of pericardial effusion. Mitral Valve: The mitral valve is grossly normal. No evidence of mitral valve regurgitation. No  evidence of mitral valve stenosis. Tricuspid Valve: The tricuspid valve is grossly normal. Tricuspid valve regurgitation is trivial. No evidence of tricuspid stenosis. Aortic Valve: The aortic valve is grossly normal. Aortic valve regurgitation is not visualized. No aortic stenosis is present. Aortic valve mean gradient measures 6.0 mmHg. Aortic valve peak gradient measures 12.2 mmHg. Aortic valve area, by VTI measures  2.46 cm. Pulmonic Valve: The pulmonic valve was grossly normal. Pulmonic valve regurgitation is not visualized. No evidence of pulmonic stenosis. Aorta: The aortic root and ascending aorta are structurally normal, with no evidence of dilitation. Venous: The inferior vena cava is normal in size with greater than 50% respiratory variability, suggesting right atrial pressure of 3 mmHg. IAS/Shunts: The atrial septum is grossly normal.  LEFT VENTRICLE PLAX 2D LVIDd:         4.30 cm     Diastology LVIDs:         2.20 cm     LV e' medial:    7.62 cm/s LV PW:         1.40 cm     LV E/e' medial:  8.7 LV IVS:        1.40 cm     LV e' lateral:   4.79 cm/s LVOT diam:     2.00 cm     LV E/e' lateral: 13.8 LV SV:         68 LV SV Index:   35 LVOT Area:     3.14 cm  LV Volumes (MOD) LV vol d, MOD A2C: 50.9 ml LV vol d, MOD A4C: 64.8 ml LV vol s, MOD A2C: 23.2 ml LV vol s, MOD A4C: 31.0 ml LV SV MOD A2C:     27.7 ml LV SV MOD A4C:     64.8 ml LV SV MOD BP:      30.0 ml RIGHT VENTRICLE             IVC RV S prime:     14.40 cm/s  IVC diam: 2.10 cm LEFT ATRIUM             Index        RIGHT ATRIUM           Index LA diam:        3.70 cm 1.90 cm/m   RA Area:     11.20 cm LA Vol (A2C):   39.2 ml 20.17 ml/m  RA Volume:   24.30 ml  12.51 ml/m LA Vol (A4C):   45.9 ml 23.62 ml/m LA Biplane Vol: 42.9 ml 22.08 ml/m   AORTIC VALVE                     PULMONIC VALVE AV Area (Vmax):    2.32 cm      PV Vmax:       0.52 m/s AV Area (Vmean):   2.24 cm      PV Vmean:      34.900 cm/s AV Area (VTI):     2.46 cm      PV VTI:        0.110 m AV Vmax:           175.00 cm/s   PV Peak grad:  1.1 mmHg AV Vmean:          115.000 cm/s  PV Mean grad:  1.0 mmHg AV VTI:            0.278 m AV Peak Grad:  12.2 mmHg AV Mean Grad:      6.0 mmHg LVOT Vmax:         129.00 cm/s LVOT Vmean:        81.900 cm/s LVOT VTI:          0.218 m LVOT/AV VTI ratio: 0.78  AORTA Ao Root diam: 3.20 cm Ao Asc diam:  3.10 cm MITRAL VALVE               TRICUSPID VALVE MV Area (PHT): 4.68 cm    TR Peak grad:   27.7 mmHg MV Decel Time: 162 msec    TR Vmax:        263.00 cm/s MV E velocity: 66.20 cm/s MV A velocity: 62.35 cm/s  SHUNTS MV E/A ratio:  1.06        Systemic VTI:  0.22 m                            Systemic Diam: 2.00 cm Eleonore Chiquito MD Electronically signed by Eleonore Chiquito MD Signature Date/Time: 04/26/2021/9:37:53 AM    Final     Cardiac Studies   Echo 04/26/2021   1. Left ventricular ejection fraction, by estimation, is 55 to 60%. The  left ventricle has normal function. The left ventricle has no regional  wall motion abnormalities. There is mild concentric left ventricular  hypertrophy. Left ventricular diastolic  parameters were normal.   2. Right ventricular systolic function is normal. The right ventricular  size is normal. There is normal pulmonary artery systolic pressure. The  estimated right ventricular systolic pressure is 15.7 mmHg.   3. The mitral valve is grossly normal. No evidence of mitral valve  regurgitation. No evidence of mitral stenosis.   4. The aortic valve is grossly normal. Aortic valve regurgitation is not  visualized. No aortic stenosis is present.   5. The inferior vena cava is normal in size with greater than 50%  respiratory variability, suggesting right atrial pressure of 3 mmHg.    Conclusion(s)/Recommendation(s): Normal biventricular function without  evidence of hemodynamically significant valvular heart disease.     Patient Profile     71 y.o. female with traumatic brain injury as a teenager, hypertension, hyperlipidemia, presented with chest pain.  Assessment & Plan    Chest pain Acute on chronic kidney disease Hypertension   Troponin minimally elevated with chest pain.  Plan for nuclear stress test today.  Further recommendation will be made based on test result. Recent echocardiogram did not show any evidence of wall motion abnormalities. Continue current medication regimen with aspirin, statin as well as beta-blocker.      For questions or updates, please contact [place] Please consult [email or web address] for contact info under Cardiology/STEMI.      Signed, Shantelle Alles, DO  04/27/2021, 10:30 AM

## 2021-04-27 NOTE — Progress Notes (Signed)
Pt has orders to be discharged. Discharge instructions given and pt has no additional questions at this time. Medication regimen reviewed and pt educated. Pt verbalized understanding and has no additional questions. Telemetry box removed. IV removed and site in good condition. Pt stable and waiting for transportation. 

## 2021-05-04 NOTE — Progress Notes (Signed)
Office Visit    Patient Name: Deborah Webb Date of Encounter: 05/06/2021  PCP:  Vincente Liberty, [facility]  Cardiologist:  Shelva Majestic, MD  Advanced Practice Provider:  No care team member to display Electrophysiologist:  None   Chief Complaint    Deborah Webb is a 71 y.o. female with a hx of hypertension, hyperlipidemia, seizures presents today for hospital follow-up visit.  She was seen in the hospital by Dr. Harriet Masson for chest pain.  She presented to urgent care 2/13 complaining of generalized chest pain, left arm pain, fatigue, and weakness.  The patient was transported to the ED and her pain resolved by the time she arrived.  EKG showed sinus rhythm, inverted T waves in leads II, aVL, aVF, V3 through V5.  Upon interview patient had reported intermittent chest pain for about a week.  She was noted to be a poor historian due to previous history of TBI and dementia.  She was scheduled for a nuclear stress test.  Recent echocardiogram did not show any evidence of wall motion abnormalities which was encouraging.  Nuclear stress test was performed 04/26/2021 and showed no ST deviation, the study was normal without any evidence of infarction.  Estimated LVEF 55 to 65%.  Today, she presents to clinic with her husband.  Results from the hospital were reviewed and were reassuring that her chest pain was noncardiac.  Thought to be musculoskeletal.  She is still having symptoms since the hospital including jerking, chest pains, and feeling tired.  She states that when she went into the hospital she did have some slurred speech and the same type of jerking.  She does have an extensive neurologic history with seizures, dementia, and TBI.  She sees Dr. Krista Blue from neurology.  It does not appear that she has a follow-up with him.  I will send him a note and I strongly suggested follow-up with him.  Otherwise, we discussed heart healthy diet.  We discussed monitoring  blood pressure daily at the same time an hour after her morning medications.  They plan to send me these results.  Reports no shortness of breath nor dyspnea on exertion. No edema, orthopnea, PND. Reports no palpitations.    Past Medical History    Past Medical History:  Diagnosis Date   Hyperlipidemia    Hypertension    Seizures ([place])    Past Surgical History:  Procedure Laterality Date   BRAIN SURGERY  1968   BREAST CYST ASPIRATION Right     Allergies  No Known Allergies    EKGs/Labs/Other Studies Reviewed:   The following studies were reviewed today:  Echo 04/26/2021   1. Left ventricular ejection fraction, by estimation, is 55 to 60%. The  left ventricle has normal function. The left ventricle has no regional  wall motion abnormalities. There is mild concentric left ventricular  hypertrophy. Left ventricular diastolic  parameters were normal.   2. Right ventricular systolic function is normal. The right ventricular  size is normal. There is normal pulmonary artery systolic pressure. The  estimated right ventricular systolic pressure is 40.9 mmHg.   3. The mitral valve is grossly normal. No evidence of mitral valve  regurgitation. No evidence of mitral stenosis.   4. The aortic valve is grossly normal. Aortic valve regurgitation is not  visualized. No aortic stenosis is present.   5. The inferior vena cava is normal in size with greater than 50%  respiratory variability, suggesting right  atrial pressure of 3 mmHg.   Conclusion(s)/Recommendation(s): Normal biventricular function without  evidence of hemodynamically significant valvular heart disease.   Stress Test 04/26/2021    The study is normal. The study is low risk.   No ST deviation was noted.   LV perfusion is normal. There is no evidence of ischemia. There is no evidence of infarction.   Left ventricular function is normal. The left ventricular ejection fraction is normal (55-65%). End diastolic cavity size  is normal. End systolic cavity size is normal.   Normal resting and stress perfusion. No ischemia or infarction EF 62%   EKG:  EKG is not ordered today.    Recent Labs: 04/25/2021: ALT 15; B Natriuretic Peptide 77.3; TSH 1.480 04/26/2021: Hemoglobin 12.9; Platelets 183 04/27/2021: BUN 27; Creatinine, Ser 1.69; Magnesium 2.2; Potassium 3.9; Sodium 137  Recent Lipid Panel    Component Value Date/Time   CHOL 117 04/27/2021 0447   TRIG 38 04/27/2021 0447   HDL 42 04/27/2021 0447   CHOLHDL 2.8 04/27/2021 0447   VLDL 8 04/27/2021 0447   LDLCALC 67 04/27/2021 0447     Home Medications   Current Meds  Medication Sig   aspirin EC 81 MG tablet Take 81 mg by mouth daily.   atorvastatin (LIPITOR) 10 MG tablet Take 10 mg by mouth every morning.    donepezil (ARICEPT) 10 MG tablet Take 1 tablet (10 mg total) by mouth at bedtime.   lamoTRIgine (LAMICTAL) 100 MG tablet Take 1 tablet (100 mg total) by mouth 2 (two) times daily.   losartan (COZAAR) 100 MG tablet Take 50 mg by mouth daily.   memantine (NAMENDA) 10 MG tablet Take 1 tablet (10 mg total) by mouth 2 (two) times daily.   metoprolol tartrate (LOPRESSOR) 50 MG tablet Take 50 mg by mouth 2 (two) times daily.   Multiple Vitamins-Minerals (MULTIVITAMIN PO) Take 1 tablet by mouth every Monday.    QUEtiapine (SEROQUEL) 50 MG tablet Take 1 tablet (50 mg total) by mouth at bedtime.   triamterene-hydrochlorothiazide (MAXZIDE-25) 37.5-25 MG per tablet Take 1 tablet by mouth daily.     Review of Systems      All other systems reviewed and are otherwise negative except as noted above.  Physical Exam    VS:  BP 124/80    Pulse 93    Ht 5\' 11"  (1.803 m)    Wt 180 lb (81.6 kg)    BMI 25.10 kg/m  , BMI Body mass index is 25.1 kg/m.  Wt Readings from Last 3 Encounters:  05/06/21 180 lb (81.6 kg)  04/27/21 177 lb 11.1 oz (80.6 kg)  12/30/20 180 lb (81.6 kg)     GEN: Well nourished, well developed, in no acute distress. HEENT:  normal. Neck: Supple, no JVD, carotid bruits, or masses. Cardiac: RRR, no murmurs, rubs, or gallops. No clubbing, cyanosis, edema.  Radials/PT 2+ and equal bilaterally.  Respiratory:  Respirations regular and unlabored, clear to auscultation bilaterally. GI: Soft, nontender, nondistended. MS: No deformity or atrophy. Skin: Warm and dry, no rash. Neuro:  Strength and sensation are intact. Psych: Normal affect.  Assessment & Plan    Chest pain -Full work-up in the hospital with echocardiogram and stress test -The studies were reassuring and there was no ischemia found -Troponins were flat -Thought to be musculoskeletal  Acute on chronic kidney disease -recent creatinine 1.69 -Avoiding nephrotoxic medications  Hypertension -Asked patient to monitor at home an hour after morning medications and record.  Please send  these values to me via MyChart or phone call -Blood pressure was well controlled today in the clinic 124/80 mmHg -Continue current medication regimen   Disposition: Follow up 6 months  with Shelva Majestic, MD or APP.  Signed, Elgie Collard, PA-C 05/06/2021, 9:11 AM [place]

## 2021-05-06 ENCOUNTER — Other Ambulatory Visit: Payer: Self-pay

## 2021-05-06 ENCOUNTER — Ambulatory Visit (HOSPITAL_BASED_OUTPATIENT_CLINIC_OR_DEPARTMENT_OTHER): Payer: Medicare PPO | Admitting: Physician Assistant

## 2021-05-06 ENCOUNTER — Encounter (HOSPITAL_BASED_OUTPATIENT_CLINIC_OR_DEPARTMENT_OTHER): Payer: Self-pay | Admitting: Physician Assistant

## 2021-05-06 VITALS — BP 124/80 | HR 93 | Ht 71.0 in | Wt 180.0 lb

## 2021-05-06 DIAGNOSIS — I10 Essential (primary) hypertension: Secondary | ICD-10-CM | POA: Diagnosis not present

## 2021-05-06 DIAGNOSIS — N189 Chronic kidney disease, unspecified: Secondary | ICD-10-CM

## 2021-05-06 DIAGNOSIS — R079 Chest pain, unspecified: Secondary | ICD-10-CM

## 2021-05-06 DIAGNOSIS — N179 Acute kidney failure, unspecified: Secondary | ICD-10-CM | POA: Diagnosis not present

## 2021-05-06 NOTE — Patient Instructions (Signed)
Medication Instructions:  Continue current medications.   *If you need a refill on your cardiac medications before your next appointment, please call your pharmacy*   Lab Work: None ordered today.    Testing/Procedures: None ordered today. Your echocardiogram and stress test were reassuring during recent admission.    Follow-Up: At [facility], you and your health needs are our priority.  As part of our continuing mission to provide you with exceptional heart care, we have created designated Provider Care Teams.  These Care Teams include your primary Cardiologist (physician) and Advanced Practice Providers (APPs -  Physician Assistants and Nurse Practitioners) who all work together to provide you with the care you need, when you need it.  We recommend signing up for the patient portal called "MyChart".  Sign up information is provided on this After Visit Summary.  MyChart is used to connect with patients for Virtual Visits (Telemedicine).  Patients are able to view lab/test results, encounter notes, upcoming appointments, etc.  Non-urgent messages can be sent to your provider as well.   To learn more about what you can do with MyChart, go to [email or web address].    Your next appointment:   6 month(s)  The format for your next appointment:   In Person  Provider:   Shelva Majestic, MD or Advanced Practice Provider    Other Instructions Tips to Measure your Blood Pressure Correctly Please send a report of blood pressure readings in 2 weeks.   Here's what you can do to ensure a correct reading:  Don't drink a caffeinated beverage or smoke during the 30 minutes before the test.  Sit quietly for five minutes before the test begins.  During the measurement, sit in a chair with your feet on the floor and your arm supported so your elbow is at about heart level.  The inflatable part of the cuff should completely cover at least 80% of your upper arm, and the cuff should be placed on  bare skin, not over a shirt.  Don't talk during the measurement.  In 2017, new guidelines from the [place], the [employer] of Cardiology, and nine other health organizations lowered the diagnosis of high blood pressure to 130/80 mm Hg or higher for all adults. The guidelines also redefined the various blood pressure categories to now include normal, elevated, Stage 1 hypertension, Stage 2 hypertension, and hypertensive crisis (see "Blood pressure categories").  Blood pressure categories  Blood pressure category SYSTOLIC (upper number)  DIASTOLIC (lower number)  Normal Less than 120 mm Hg and Less than 80 mm Hg  Elevated 120-129 mm Hg and Less than 80 mm Hg  High blood pressure: Stage 1 hypertension 130-139 mm Hg or 80-89 mm Hg  High blood pressure: Stage 2 hypertension 140 mm Hg or higher or 90 mm Hg or higher  Hypertensive crisis (consult your doctor immediately) Higher than 180 mm Hg and/or Higher than 120 mm Hg  Source: American Heart Association and American Stroke Association. For more on getting your blood pressure under control, buy Controlling Your Blood Pressure, a Special Health Report from [facility].   Blood Pressure Log   Date   Time  Blood Pressure  Position  Example: Nov 1 9 AM 124/78 sitting  Mediterranean Diet A Mediterranean diet refers to food and lifestyle choices that are based on the traditions of countries located on the [employer]. It focuses on eating more fruits, vegetables, whole grains, beans, nuts, seeds, and heart-healthy fats, and eating less dairy, meat, eggs, and processed foods with added sugar, salt, and fat. This way of eating has been shown to help prevent certain conditions and improve outcomes for people who have chronic diseases, like kidney disease and heart disease. What are tips for following this plan? Reading food labels Check the  serving size of packaged foods. For foods such as rice and pasta, the serving size refers to the amount of cooked product, not dry. Check the total fat in packaged foods. Avoid foods that have saturated fat or trans fats. Check the ingredient list for added sugars, such as corn syrup. Shopping  Buy a variety of foods that offer a balanced diet, including: Fresh fruits and vegetables (produce). Grains, beans, nuts, and seeds. Some of these may be available in unpackaged forms or large amounts (in bulk). Fresh seafood. Poultry and eggs. Low-fat dairy products. Buy whole ingredients instead of prepackaged foods. Buy fresh fruits and vegetables in-season from local farmers markets. Buy plain frozen fruits and vegetables. If you do not have access to quality fresh seafood, buy precooked frozen shrimp or canned fish, such as tuna, salmon, or sardines. Stock your pantry so you always have certain foods on hand, such as olive oil, canned tuna, canned tomatoes, rice, pasta, and beans. Cooking Cook foods with extra-virgin olive oil instead of using butter or other vegetable oils. Have meat as a side dish, and have vegetables or grains as your main dish. This means having meat in small portions or adding small amounts of meat to foods like pasta or stew. Use beans or vegetables instead of meat in common dishes like chili or lasagna. Experiment with different cooking methods. Try roasting, broiling, steaming, and sauting vegetables. Add frozen vegetables to soups, stews, pasta, or rice. Add nuts or seeds for added healthy fats and plant protein at each meal. You can add these to yogurt, salads, or vegetable dishes. Marinate fish or vegetables using olive oil, lemon juice, garlic, and fresh herbs. Meal planning Plan to eat one vegetarian meal one day each week. Try to work up to two vegetarian meals, if possible. Eat seafood two or more times a week. Have healthy snacks readily available, such  as: Vegetable sticks with hummus. Greek yogurt. Fruit and nut trail mix. Eat balanced meals throughout the week. This includes: Fruit: 2-3 servings a day. Vegetables: 4-5 servings a day. Low-fat dairy: 2 servings a day. Fish, poultry, or lean meat: 1 serving a day. Beans and legumes: 2 or more servings a week. Nuts and seeds: 1-2 servings a day. Whole grains: 6-8 servings a day. Extra-virgin olive oil: 3-4 servings a day. Limit red meat and sweets to only a few servings a month. Lifestyle  Cook and eat meals together with your family, when possible. Drink enough fluid to keep your urine pale yellow. Be physically active every day. This includes: Aerobic exercise like running or swimming. Leisure activities like gardening, walking, or housework. Get 7-8 hours of sleep each night. If recommended by your health care provider, drink red wine in moderation. This means 1 glass a day for nonpregnant women and 2 glasses a day for men. A glass of wine equals 5 oz (150 mL). What foods should I eat? Fruits Apples. Apricots. Avocado. Berries. Bananas. Cherries.  Dates. Figs. Grapes. Lemons. Melon. Oranges. Peaches. Plums. Pomegranate. Vegetables Artichokes. Beets. Broccoli. Cabbage. Carrots. Eggplant. Green beans. Chard. Kale. Spinach. Onions. Leeks. Peas. Squash. Tomatoes. Peppers. Radishes. Grains Whole-grain pasta. Brown rice. Bulgur wheat. Polenta. Couscous. Whole-wheat bread. [place]. Meats and other proteins Beans. Almonds. Sunflower seeds. Pine nuts. Peanuts. [place]. Salmon. Scallops. Shrimp. [place]. Tilapia. Clams. Oysters. Eggs. Poultry without skin. Dairy Low-fat milk. Cheese. Greek yogurt. Fats and oils Extra-virgin olive oil. Avocado oil. Grapeseed oil. Beverages Water. Red wine. Herbal tea. Sweets and desserts Greek yogurt with honey. Baked apples. Poached pears. Trail mix. Seasonings and condiments Basil. Cilantro. Coriander. Cumin. Mint. Parsley. Sage. Rosemary.  Tarragon. Garlic. Oregano. Thyme. Pepper. Balsamic vinegar. Tahini. Hummus. Tomato sauce. Olives. Mushrooms. The items listed above may not be a complete list of foods and beverages you can eat. Contact a dietitian for more information. What foods should I limit? This is a list of foods that should be eaten rarely or only on special occasions. Fruits Fruit canned in syrup. Vegetables Deep-fried potatoes (french fries). Grains Prepackaged pasta or rice dishes. Prepackaged cereal with added sugar. Prepackaged snacks with added sugar. Meats and other proteins Beef. Pork. Lamb. Poultry with skin. Hot dogs. Berniece Salines. Dairy Ice cream. Sour cream. Whole milk. Fats and oils Butter. Canola oil. Vegetable oil. Beef fat (tallow). Lard. Beverages Juice. Sugar-sweetened soft drinks. Beer. Liquor and spirits. Sweets and desserts Cookies. Cakes. Pies. Candy. Seasonings and condiments Mayonnaise. Pre-made sauces and marinades. The items listed above may not be a complete list of foods and beverages you should limit. Contact a dietitian for more information. Summary The Mediterranean diet includes both food and lifestyle choices. Eat a variety of fresh fruits and vegetables, beans, nuts, seeds, and whole grains. Limit the amount of red meat and sweets that you eat. If recommended by your health care provider, drink red wine in moderation. This means 1 glass a day for nonpregnant women and 2 glasses a day for men. A glass of wine equals 5 oz (150 mL). This information is not intended to replace advice given to you by your health care provider. Make sure you discuss any questions you have with your health care provider. Document Revised: 04/04/2019 Document Reviewed: 01/30/2019 Elsevier Patient Education  2022 [employer].

## 2021-05-10 ENCOUNTER — Telehealth: Payer: Self-pay | Admitting: Physician Assistant

## 2021-05-10 NOTE — Telephone Encounter (Signed)
Pt c/o of Chest Pain: STAT if CP now or developed within 24 hours  1. Are you having CP right now?  No   2. Are you experiencing any other symptoms (ex. SOB, nausea, vomiting, sweating)?  Jerking movement  3. How long have you been experiencing CP?  Ongoing from 2/13 admission  4. Is your CP continuous or coming and going?  Lower chest pain coming and going, mainly at night when laying down   5. Have you taken Nitroglycerin?  No  ?

## 2021-05-10 NOTE — Telephone Encounter (Signed)
Not accepting messages. Mailbox not set up.

## 2021-05-12 ENCOUNTER — Telehealth: Payer: Self-pay | Admitting: Physician Assistant

## 2021-05-12 NOTE — Telephone Encounter (Signed)
Patient's spouse called stating she is still having the same problem.  ?At night she is still grasping for breath, and has pain under her breast.   ?

## 2021-05-13 NOTE — Telephone Encounter (Signed)
Patient was evaluated by me 2/24 and cardiac cause of her chest pain was ruled out at that time. She has a hx of seizures and the "jerking" when she is sleeping seems more neurologic to me. I asked them to follow-up with Neurology. She could also follow-up with her PCP for further evaluation of breast pain and fatigue.  ? ?I will say the "gasping for breath" at night could be a sign of sleep apnea and we could certainly get her set up with Dr. Claiborne Billings or Dr. Radford Pax for further evaluation. I don;t think they mentioned this during our appointment.  ? ?If he is really concerned and she is very symptomatic I would encourage them to go to the ED for further evaluation. There is nothing else I would do from a cardiac standpoint at this time.  ? ?Elgie Collard, PA-C ? ?

## 2021-05-13 NOTE — Telephone Encounter (Signed)
-  Husband made aware and state he will reach out to pcp and neurologist. ?-Appointment scheduled for 4/4 with Dr. Claiborne Billings to discuss possible sleep apnea.  ?

## 2021-05-13 NOTE — Telephone Encounter (Signed)
-  Husband called to report pt is still experiencing chest discomfort under left breast and feeling worn out after exertion. ?-He also report she is still exhibiting jerking movement while sleeping. ?-He denies chest pain at the moment and report she is currently resting ?-Husband state pt was previously evaluated for symptoms and everything checked out ok, but can't understand why pt is still hurting. ? ?Will forward to PA for recommendations.  ? ? ?

## 2021-05-20 NOTE — Telephone Encounter (Signed)
Yes, she had a full cardiac w/o which was negative. She needs to f/u with neurology which I have suggested several times. Thank you.

## 2021-05-20 NOTE — Telephone Encounter (Signed)
Spoke with pt and pt's husband  Pt still notes pain  under breast also a jerking sensation noted Per pt continues to note pain but not all the time Reviewed last office note and testing has came back suggesting not heart related reassured pt this and also advised  could try taking Tylenol as directed for pain and should also F/U with PCP  may need GI referral .Pt's husband verbalized understanding and will keep April appt with Dr Claiborne Billings /cy ?

## 2021-06-14 ENCOUNTER — Ambulatory Visit (INDEPENDENT_AMBULATORY_CARE_PROVIDER_SITE_OTHER): Payer: Medicare PPO | Admitting: Cardiovascular Disease

## 2021-06-14 ENCOUNTER — Encounter: Payer: Self-pay | Admitting: Cardiovascular Disease

## 2021-06-14 VITALS — BP 130/84 | HR 66 | Ht 71.0 in | Wt 177.2 lb

## 2021-06-14 DIAGNOSIS — Z8669 Personal history of other diseases of the nervous system and sense organs: Secondary | ICD-10-CM

## 2021-06-14 DIAGNOSIS — F03A0 Unspecified dementia, mild, without behavioral disturbance, psychotic disturbance, mood disturbance, and anxiety: Secondary | ICD-10-CM

## 2021-06-14 DIAGNOSIS — R0789 Other chest pain: Secondary | ICD-10-CM | POA: Diagnosis not present

## 2021-06-14 DIAGNOSIS — R079 Chest pain, unspecified: Secondary | ICD-10-CM

## 2021-06-14 DIAGNOSIS — I10 Essential (primary) hypertension: Secondary | ICD-10-CM | POA: Diagnosis not present

## 2021-06-14 DIAGNOSIS — N184 Chronic kidney disease, stage 4 (severe): Secondary | ICD-10-CM

## 2021-06-14 DIAGNOSIS — E7800 Pure hypercholesterolemia, unspecified: Secondary | ICD-10-CM | POA: Diagnosis not present

## 2021-06-14 NOTE — Patient Instructions (Signed)
Medication Instructions:  ?No changes ? ?*If you need a refill on your cardiac medications before your next appointment, please call your pharmacy* ? ? ?Lab Work: ?Not needed ? ? ? ?Testing/Procedures: ?Not needed ? ? ?Follow-Up: ?At [facility], you and your health needs are our priority.  As part of our continuing mission to provide you with exceptional heart care, we have created designated Provider Care Teams.  These Care Teams include your primary Cardiologist (physician) and Advanced Practice Providers (APPs -  Physician Assistants and Nurse Practitioners) who all work together to provide you with the care you need, when you need it. ? ?We recommend signing up for the patient portal called "MyChart".  Sign up information is provided on this After Visit Summary.  MyChart is used to connect with patients for Virtual Visits (Telemedicine).  Patients are able to view lab/test results, encounter notes, upcoming appointments, etc.  Non-urgent messages can be sent to your provider as well.   ?To learn more about what you can do with MyChart, go to [email or web address].   ? ?Your next appointment:   ?  as needed ? ?The format for your next appointment:   ?In Person ? ?Provider:   ?Shelva Majestic, MD   ? ? ? ?

## 2021-06-14 NOTE — Progress Notes (Signed)
? ?Cardiology Office Note   ? ?Date:  06/18/2021  ? ?ID:  Deborah Webb, DOB 01/09/1951, MRN [id number] ? ?PCP:  Vincente Liberty, MD  ?Cardiologist:  Shelva Majestic, MD  ? ?Initial office visit with me in f/u of hospitalization  ? ? ?History of Present Illness:  ?Deborah Webb is a 71 y.o. 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  She has a history of hypertension, hyperlipidemia, seizure disorder as well as dementia.  She was unaware of any significant coronary artery disease in February 2023 began to notice intermittent nonexertional episodes of vague chest discomfort.  At times her symptoms were sharp and other times they were just present.  She ultimately presented to urgent care hospital and ultimately admitted to [facility] on April 25, 2021.  Initial laboratory showed sodium 138 potassium 3.2 creatinine 2.18 with a BUN of 32 and estimated GFR of 24 consistent with CKD stage IV.Marland Kitchen  High-sensitivity troponins were negative and ranged from 1617 and 19.  BNP was normal at 77.  ECG showed sinus rhythm with inverted T waves inferiorly as well as anterolaterally.  Chest x-ray suggested possible mild CHF.  An echo Doppler study showed normal systolic function with EF 55 to 60%, normal RV function and evidence for LVH with normal diastolic parameters.  Valves were normal.  Subsequent ECG showed slight improvement in previous T wave abnormality.  During her hospitalization she underwent an ischemic assessment with a Lexiscan Myoview study which was normal without evidence for ischemia or infarction.  Estimated EF is 55 to 65%.  She was ultimately discharged and was seen in May 06, 2021 the office by Nicholes Rough, PA.  It was felt that her chest pain was noncardiac and most likely musculoskeletal in etiology.  She sees Dr. Krista Blue from neurology with her extensive neurologic history including seizures, dementia, and traumatic brain injury. ? ?Presently, Deborah Webb feels well.  She  complains of tenderness and soreness of the right chest wall.  There is no shortness of breath.  She admits to some fatigability.  She continues to be on Aricept and Namenda for dementia, and is on losartan 100 mg, triamterene HCT and metoprolol tartrate 50 mg twice a day for hypertension.  She is on atorvastatin 10 mg daily for elevated lipids.  She is also on Lamictal 100 mg twice a day and takes Seroquel at bedtime.  She presents for evaluation. ? ? ?Past Medical History:  ?Diagnosis Date  ? Hyperlipidemia   ? Hypertension   ? Seizures ([place])   ? ? ?Past Surgical History:  ?Procedure Laterality Date  ? BRAIN SURGERY  1968  ? BREAST CYST ASPIRATION Right   ? ? ?Current Medications: ?Outpatient Medications Prior to Visit  ?Medication Sig Dispense Refill  ? aspirin EC 81 MG tablet Take 81 mg by mouth daily.    ? atorvastatin (LIPITOR) 10 MG tablet Take 10 mg by mouth every morning.     ? donepezil (ARICEPT) 10 MG tablet Take 1 tablet (10 mg total) by mouth at bedtime. 90 tablet 1  ? lamoTRIgine (LAMICTAL) 100 MG tablet Take 1 tablet (100 mg total) by mouth 2 (two) times daily. 180 tablet 4  ? losartan (COZAAR) 100 MG tablet Take 50 mg by mouth daily.    ? memantine (NAMENDA) 10 MG tablet Take 1 tablet (10 mg total) by mouth 2 (two) times daily. 180 tablet 4  ? metoprolol tartrate (LOPRESSOR) 50 MG tablet Take 50 mg by mouth 2 (  two) times daily.    ? Multiple Vitamins-Minerals (MULTIVITAMIN PO) Take 1 tablet by mouth every Monday.     ? QUEtiapine (SEROQUEL) 50 MG tablet Take 1 tablet (50 mg total) by mouth at bedtime. 30 tablet 11  ? triamterene-hydrochlorothiazide (MAXZIDE-25) 37.5-25 MG per tablet Take 1 tablet by mouth daily.    ? Vitamin D, Ergocalciferol, (DRISDOL) 1.25 MG (50000 UNIT) CAPS capsule Take 50,000 Units by mouth once a week.    ? ?No facility-administered medications prior to visit.  ?  ? ?Allergies:   Patient has no known allergies.  ? ?Social History  ? ?Socioeconomic History  ? Marital status:  Married  ?  Spouse name: Not on file  ? Number of children: Not on file  ? Years of education: HS  ? Highest education level: Not on file  ?Occupational History  ? Occupation: Retired  ?Tobacco Use  ? Smoking status: Never  ? Smokeless tobacco: Never  ?Vaping Use  ? Vaping Use: Never used  ?Substance and Sexual Activity  ? Alcohol use: No  ? Drug use: No  ? Sexual activity: Not on file  ?Other Topics Concern  ? Not on file  ?Social History Narrative  ? Lives at home with her husband.  ? Her only child, 3 year-old son, passed away in 17-Mar-2016.  ? 1 cup caffeine per day.  ? ?Social Determinants of Health  ? ?Financial Resource Strain: Not on file  ?Food Insecurity: No Food Insecurity  ? Worried About Charity fundraiser in the Last Year: Never true  ? Ran Out of Food in the Last Year: Never true  ?Transportation Needs: No Transportation Needs  ? Lack of Transportation (Medical): No  ? Lack of Transportation (Non-Medical): No  ?Physical Activity: Not on file  ?Stress: Not on file  ?Social Connections: Not on file  ?  ? ?Family History:  The patient's family history includes Breast cancer in her mother; Other in her father.  ? ?ROS ?General: Negative; No fevers, chills, or night sweats;  ?HEENT: Negative; No changes in vision or hearing, sinus congestion, difficulty swallowing ?Pulmonary: Negative; No cough, wheezing, shortness of breath, hemoptysis ?Cardiovascular: Negative; No chest pain, presyncope, syncope, palpitations ?GI: Negative; No nausea, vomiting, diarrhea, or abdominal pain ?GU: Negative; No dysuria, hematuria, or difficulty voiding ?Musculoskeletal: Negative; no myalgias, joint pain, or weakness ?Hematologic/Oncology: Negative; no easy bruising, bleeding ?Endocrine: Negative; no heat/cold intolerance; no diabetes ?Neuro: History of traumatic brain injury, seizures and dementia followed by Dr. Krista Blue ?Skin: Negative; No rashes or skin lesions ?Psychiatric: Negative; No behavioral problems,  depression ?Sleep: Negative; No snoring, daytime sleepiness, hypersomnolence, bruxism, restless legs, hypnogognic hallucinations, no cataplexy ?Other comprehensive 14 point system review is negative. ? ? ?PHYSICAL EXAM:   ?VS:  BP 130/84   Pulse 66   Ht '5\' 11"'$  (1.803 m)   Wt 177 lb 3.2 oz (80.4 kg)   SpO2 97%   BMI 24.71 kg/m?    ? ?Repeat blood pressure by me was 118/80 ? ?Wt Readings from Last 3 Encounters:  ?06/14/21 177 lb 3.2 oz (80.4 kg)  ?05/06/21 180 lb (81.6 kg)  ?04/27/21 177 lb 11.1 oz (80.6 kg)  ?  ?General: Alert, oriented, no distress.  ?Skin: normal turgor, no rashes, warm and dry ?HEENT: Normocephalic, atraumatic. Pupils equal round and reactive to light; sclera anicteric; extraocular muscles intact; Fundi ** ?Nose without nasal septal hypertrophy ?Mouth/Parynx benign; Mallinpatti scale ?Neck: No JVD, no carotid bruits; normal carotid upstroke ?Lungs: clear  to ausculatation and percussion; no wheezing or rales ?Chest wall: Right chest wall tenderness to palpation immediately under the metal band of her bra on the right side.  Her her bra is tight and the metal band has caused significant skin irritation and tenderness which is her chest pain ?Heart: PMI not displaced, RRR, s1 s2 normal, 1/6 systolic murmur, no diastolic murmur, no rubs, gallops, thrills, or heaves ?Abdomen: soft, nontender; no hepatosplenomehaly, BS+; abdominal aorta nontender and not dilated by palpation. ?Back: no CVA tenderness ?Pulses 2+ ?Musculoskeletal: full range of motion, normal strength, no joint deformities ?Extremities: no clubbing cyanosis or edema, Homan's sign negative  ?Neurologic: grossly nonfocal; Cranial nerves grossly wnl ?Psychologic: Quiet affect ? ? ?Studies/Labs Reviewed:  ? ?ECG (independently read by me):  NSR at 66; Nonspecific T wave abnormality ? ?Recent Labs: ? ?  Latest Ref Rng & Units 04/27/2021  ?  4:47 AM 04/26/2021  ?  2:17 AM 04/25/2021  ?  6:17 PM  ?BMP  ?Glucose 70 - 99 mg/dL 111   145   101     ?BUN 8 - 23 mg/dL 27   33   32    ?Creatinine 0.44 - 1.00 mg/dL 1.69   2.16   2.18    ?Sodium 135 - 145 mmol/L 137   139   138    ?Potassium 3.5 - 5.1 mmol/L 3.9   3.9   3.2    ?Chloride 98 - 111 mmol/L 102   104   102    ?CO

## 2021-06-18 ENCOUNTER — Encounter: Payer: Self-pay | Admitting: Cardiovascular Disease

## 2021-07-06 ENCOUNTER — Other Ambulatory Visit: Payer: Self-pay | Admitting: *Deleted

## 2021-07-06 MED ORDER — LAMOTRIGINE 100 MG PO TABS: 100.0000 mg | ORAL_TABLET | Freq: Two times a day (BID) | ORAL | 0 refills | Status: DC

## 2021-07-07 ENCOUNTER — Ambulatory Visit: Payer: Medicare PPO | Admitting: Neurology

## 2021-07-07 VITALS — BP 170/90 | HR 55 | Ht 71.0 in | Wt 178.0 lb

## 2021-07-07 DIAGNOSIS — Z87820 Personal history of traumatic brain injury: Secondary | ICD-10-CM

## 2021-07-07 DIAGNOSIS — F03A0 Unspecified dementia, mild, without behavioral disturbance, psychotic disturbance, mood disturbance, and anxiety: Secondary | ICD-10-CM | POA: Diagnosis not present

## 2021-07-07 DIAGNOSIS — R569 Unspecified convulsions: Secondary | ICD-10-CM

## 2021-07-07 MED ORDER — LAMOTRIGINE 100 MG PO TABS: 100.0000 mg | ORAL_TABLET | Freq: Two times a day (BID) | ORAL | 3 refills | Status: DC
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
## 2021-07-07 NOTE — Patient Instructions (Addendum)
Let's continue current medications  ?Watch for any changes to sleep at night ?See you back in 6 months ?

## 2021-07-07 NOTE — Progress Notes (Signed)
Chart reviewed, agree above plan ?

## 2021-07-11 ENCOUNTER — Other Ambulatory Visit: Payer: Self-pay

## 2021-07-11 MED ORDER — MEMANTINE HCL 10 MG PO TABS: 10.0000 mg | ORAL_TABLET | Freq: Two times a day (BID) | ORAL | 4 refills | Status: DC

## 2021-07-11 MED ORDER — LAMOTRIGINE 100 MG PO TABS: 100.0000 mg | ORAL_TABLET | Freq: Two times a day (BID) | ORAL | 3 refills | Status: DC

## 2021-07-11 NOTE — Progress Notes (Signed)
Rx sent to [facility] due to insurance preference. ?

## 2021-11-03 ENCOUNTER — Other Ambulatory Visit: Payer: Self-pay

## 2021-11-03 MED ORDER — DONEPEZIL HCL 10 MG PO TABS: 10.0000 mg | ORAL_TABLET | Freq: Every day | ORAL | 1 refills | Status: DC
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
## 2022-01-12 ENCOUNTER — Ambulatory Visit (INDEPENDENT_AMBULATORY_CARE_PROVIDER_SITE_OTHER): Payer: Medicare PPO | Admitting: Neurology

## 2022-01-12 ENCOUNTER — Encounter: Payer: Self-pay | Admitting: Neurology

## 2022-01-12 VITALS — BP 181/97 | HR 55 | Ht 71.0 in | Wt 180.0 lb

## 2022-01-12 DIAGNOSIS — F03A0 Unspecified dementia, mild, without behavioral disturbance, psychotic disturbance, mood disturbance, and anxiety: Secondary | ICD-10-CM | POA: Diagnosis not present

## 2022-01-12 DIAGNOSIS — R569 Unspecified convulsions: Secondary | ICD-10-CM | POA: Diagnosis not present

## 2022-01-12 DIAGNOSIS — Z87820 Personal history of traumatic brain injury: Secondary | ICD-10-CM | POA: Diagnosis not present

## 2022-01-12 MED ORDER — QUETIAPINE FUMARATE 50 MG PO TABS: 50.0000 mg | ORAL_TABLET | Freq: Every day | ORAL | 3 refills | Status: DC

## 2022-01-12 MED ORDER — DONEPEZIL HCL 10 MG PO TABS: 10.0000 mg | ORAL_TABLET | Freq: Every morning | ORAL | 1 refills | Status: DC

## 2022-01-12 MED ORDER — MEMANTINE HCL 10 MG PO TABS: 10.0000 mg | ORAL_TABLET | Freq: Two times a day (BID) | ORAL | 4 refills | Status: DC

## 2022-01-12 MED ORDER — LAMOTRIGINE 100 MG PO TABS: 100.0000 mg | ORAL_TABLET | Freq: Two times a day (BID) | ORAL | 3 refills | Status: DC

## 2022-01-12 NOTE — Patient Instructions (Addendum)
Take Aricept in the morning, instead of night, if you notice no change, we may stop this Keep other medications See you back in 6 months

## 2022-01-26 ENCOUNTER — Other Ambulatory Visit: Payer: Self-pay | Admitting: Neurology

## 2022-04-18 DIAGNOSIS — M1990 Unspecified osteoarthritis, unspecified site: Secondary | ICD-10-CM | POA: Diagnosis not present

## 2022-04-18 DIAGNOSIS — Z79899 Other long term (current) drug therapy: Secondary | ICD-10-CM | POA: Diagnosis not present

## 2022-04-18 DIAGNOSIS — G309 Alzheimer's disease, unspecified: Secondary | ICD-10-CM | POA: Diagnosis not present

## 2022-04-18 DIAGNOSIS — M2550 Pain in unspecified joint: Secondary | ICD-10-CM | POA: Diagnosis not present

## 2022-04-18 DIAGNOSIS — E7800 Pure hypercholesterolemia, unspecified: Secondary | ICD-10-CM | POA: Diagnosis not present

## 2022-04-18 DIAGNOSIS — G40909 Epilepsy, unspecified, not intractable, without status epilepticus: Secondary | ICD-10-CM | POA: Diagnosis not present

## 2022-04-18 DIAGNOSIS — I119 Hypertensive heart disease without heart failure: Secondary | ICD-10-CM | POA: Diagnosis not present

## 2022-04-18 DIAGNOSIS — E559 Vitamin D deficiency, unspecified: Secondary | ICD-10-CM | POA: Diagnosis not present

## 2022-04-18 DIAGNOSIS — N1832 Chronic kidney disease, stage 3b: Secondary | ICD-10-CM | POA: Diagnosis not present

## 2022-04-20 DIAGNOSIS — Z79899 Other long term (current) drug therapy: Secondary | ICD-10-CM | POA: Diagnosis not present

## 2022-04-20 DIAGNOSIS — N183 Chronic kidney disease, stage 3 unspecified: Secondary | ICD-10-CM | POA: Diagnosis not present

## 2022-04-20 DIAGNOSIS — I119 Hypertensive heart disease without heart failure: Secondary | ICD-10-CM | POA: Diagnosis not present

## 2022-04-20 DIAGNOSIS — E559 Vitamin D deficiency, unspecified: Secondary | ICD-10-CM | POA: Diagnosis not present

## 2022-04-20 DIAGNOSIS — E7800 Pure hypercholesterolemia, unspecified: Secondary | ICD-10-CM | POA: Diagnosis not present

## 2022-06-01 ENCOUNTER — Other Ambulatory Visit: Payer: Self-pay

## 2022-06-01 MED ORDER — DONEPEZIL HCL 10 MG PO TABS: 10.0000 mg | ORAL_TABLET | Freq: Every morning | ORAL | 1 refills | Status: DC

## 2022-06-01 NOTE — Progress Notes (Signed)
Rx sent 

## 2022-06-05 ENCOUNTER — Other Ambulatory Visit: Payer: Self-pay

## 2022-06-05 MED ORDER — QUETIAPINE FUMARATE 50 MG PO TABS: 50.0000 mg | ORAL_TABLET | Freq: Every day | ORAL | 3 refills | Status: DC

## 2022-06-05 MED ORDER — DONEPEZIL HCL 10 MG PO TABS: 10.0000 mg | ORAL_TABLET | Freq: Every morning | ORAL | 1 refills | Status: DC

## 2022-06-12 ENCOUNTER — Other Ambulatory Visit: Payer: Self-pay

## 2022-06-12 MED ORDER — LAMOTRIGINE 100 MG PO TABS: 100.0000 mg | ORAL_TABLET | Freq: Two times a day (BID) | ORAL | 3 refills | Status: DC

## 2022-06-12 NOTE — Progress Notes (Signed)
Rx sent to [facility]. 

## 2022-06-27 DIAGNOSIS — Z79899 Other long term (current) drug therapy: Secondary | ICD-10-CM | POA: Diagnosis not present

## 2022-06-27 DIAGNOSIS — E559 Vitamin D deficiency, unspecified: Secondary | ICD-10-CM | POA: Diagnosis not present

## 2022-06-27 DIAGNOSIS — G40909 Epilepsy, unspecified, not intractable, without status epilepticus: Secondary | ICD-10-CM | POA: Diagnosis not present

## 2022-06-27 DIAGNOSIS — N1832 Chronic kidney disease, stage 3b: Secondary | ICD-10-CM | POA: Diagnosis not present

## 2022-06-27 DIAGNOSIS — E7800 Pure hypercholesterolemia, unspecified: Secondary | ICD-10-CM | POA: Diagnosis not present

## 2022-06-27 DIAGNOSIS — G309 Alzheimer's disease, unspecified: Secondary | ICD-10-CM | POA: Diagnosis not present

## 2022-06-27 DIAGNOSIS — I119 Hypertensive heart disease without heart failure: Secondary | ICD-10-CM | POA: Diagnosis not present

## 2022-06-27 DIAGNOSIS — M1990 Unspecified osteoarthritis, unspecified site: Secondary | ICD-10-CM | POA: Diagnosis not present

## 2022-06-27 DIAGNOSIS — M2550 Pain in unspecified joint: Secondary | ICD-10-CM | POA: Diagnosis not present

## 2022-08-10 ENCOUNTER — Ambulatory Visit: Payer: Medicare HMO | Admitting: Neurology

## 2022-08-10 ENCOUNTER — Encounter: Payer: Self-pay | Admitting: Neurology

## 2022-08-10 VITALS — BP 160/89 | HR 55 | Ht 71.0 in | Wt 189.0 lb

## 2022-08-10 DIAGNOSIS — Z87820 Personal history of traumatic brain injury: Secondary | ICD-10-CM | POA: Diagnosis not present

## 2022-08-10 DIAGNOSIS — R569 Unspecified convulsions: Secondary | ICD-10-CM | POA: Diagnosis not present

## 2022-08-10 DIAGNOSIS — F03A0 Unspecified dementia, mild, without behavioral disturbance, psychotic disturbance, mood disturbance, and anxiety: Secondary | ICD-10-CM | POA: Diagnosis not present

## 2022-08-10 MED ORDER — DONEPEZIL HCL 10 MG PO TABS: 10.0000 mg | ORAL_TABLET | Freq: Every morning | ORAL | 1 refills | Status: DC

## 2022-08-10 MED ORDER — QUETIAPINE FUMARATE 25 MG PO TABS: 25.0000 mg | ORAL_TABLET | Freq: Every day | ORAL | 5 refills | Status: DC
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
## 2022-08-10 NOTE — Patient Instructions (Signed)
Try to reduce Seroquel to 25 mg at bedtime, if she doesn't do well and has agitation, not sleeping will increase back   Keep other medications

## 2022-11-07 DIAGNOSIS — G309 Alzheimer's disease, unspecified: Secondary | ICD-10-CM | POA: Diagnosis not present

## 2022-11-07 DIAGNOSIS — M1990 Unspecified osteoarthritis, unspecified site: Secondary | ICD-10-CM | POA: Diagnosis not present

## 2022-11-07 DIAGNOSIS — G40909 Epilepsy, unspecified, not intractable, without status epilepticus: Secondary | ICD-10-CM | POA: Diagnosis not present

## 2022-11-07 DIAGNOSIS — I119 Hypertensive heart disease without heart failure: Secondary | ICD-10-CM | POA: Diagnosis not present

## 2022-11-07 DIAGNOSIS — E7800 Pure hypercholesterolemia, unspecified: Secondary | ICD-10-CM | POA: Diagnosis not present

## 2022-11-07 DIAGNOSIS — E559 Vitamin D deficiency, unspecified: Secondary | ICD-10-CM | POA: Diagnosis not present

## 2022-11-07 DIAGNOSIS — N1832 Chronic kidney disease, stage 3b: Secondary | ICD-10-CM | POA: Diagnosis not present

## 2022-11-07 DIAGNOSIS — Z79899 Other long term (current) drug therapy: Secondary | ICD-10-CM | POA: Diagnosis not present

## 2022-11-07 DIAGNOSIS — Z0001 Encounter for general adult medical examination with abnormal findings: Secondary | ICD-10-CM | POA: Diagnosis not present

## 2022-11-28 ENCOUNTER — Other Ambulatory Visit: Payer: Self-pay | Admitting: Neurology

## 2022-11-30 IMAGING — MG MM DIGITAL SCREENING BILAT W/ TOMO AND CAD
8 series · 8 of 24 positions shown · non-contrast
Comparison: Previous exam(s).

CLINICAL DATA: Screening.

EXAM:
DIGITAL SCREENING BILATERAL MAMMOGRAM WITH TOMOSYNTHESIS AND CAD
TECHNIQUE: Bilateral screening digital craniocaudal and mediolateral oblique
mammograms were obtained. Bilateral screening digital breast
tomosynthesis was performed. The images were evaluated with
computer-aided detection.

[L CC synth-2D]
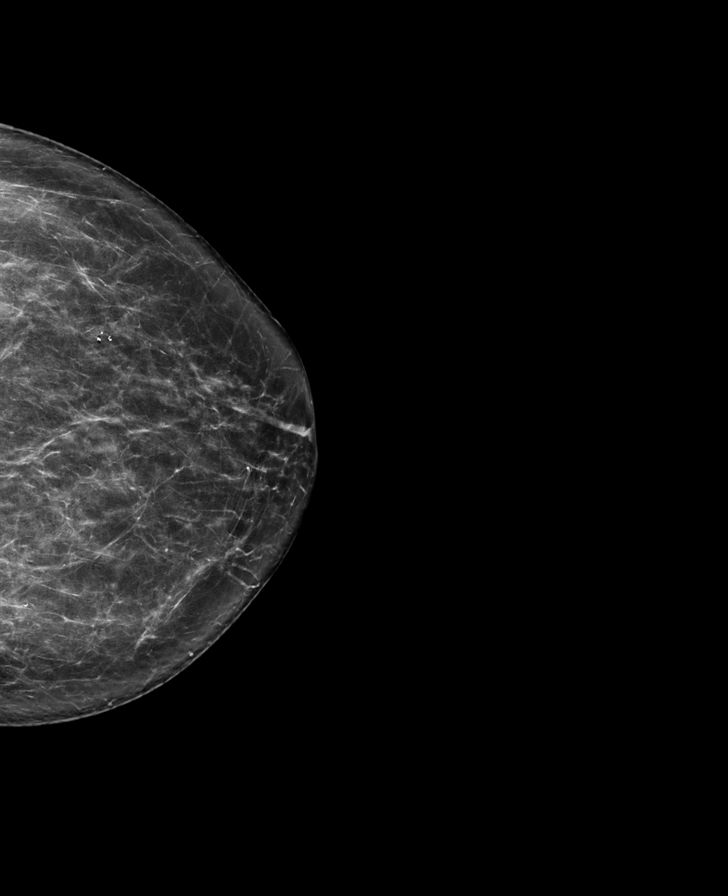

[R CC synth-2D]
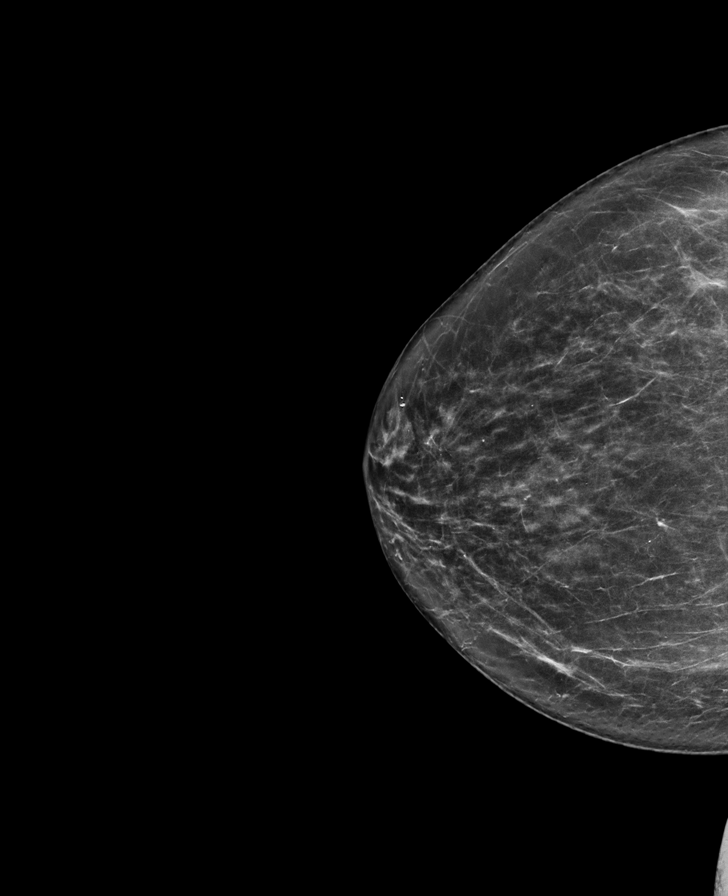

[R MLO synth-2D]
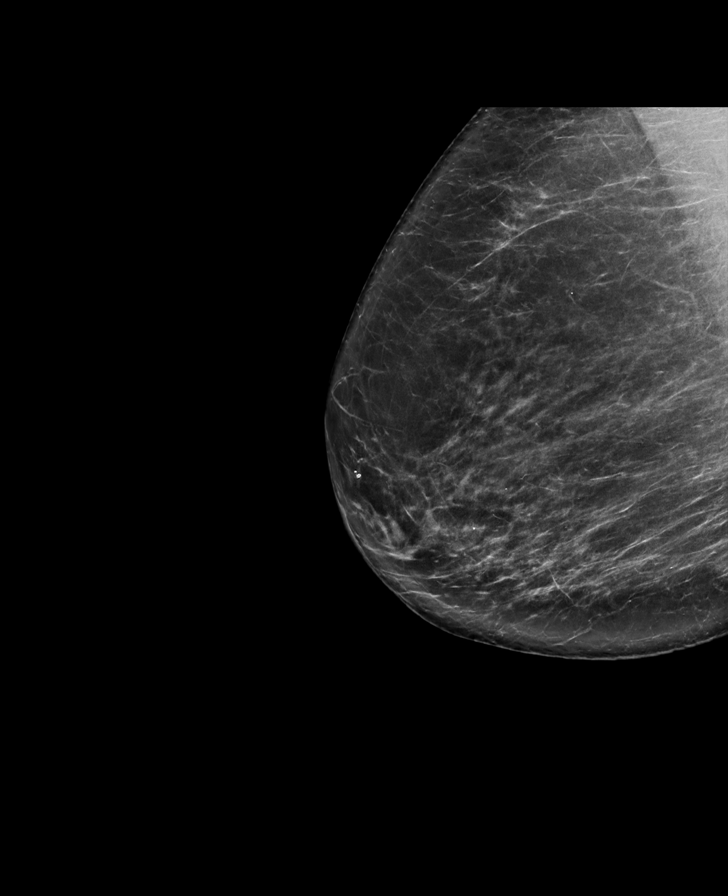

[L MLO synth-2D]
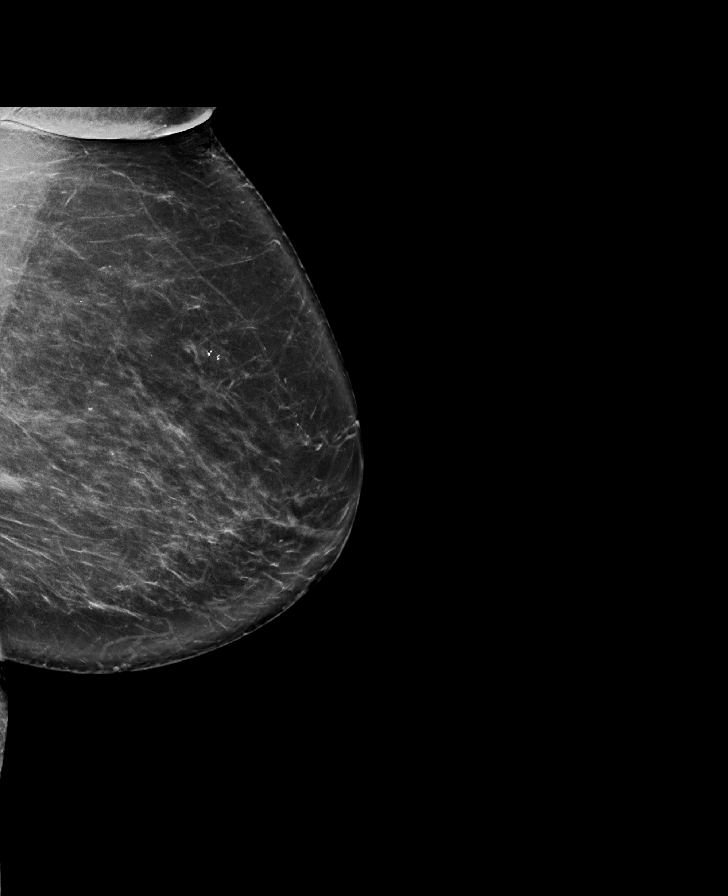

[R MLO tomo · tomo slice 41/80.0]
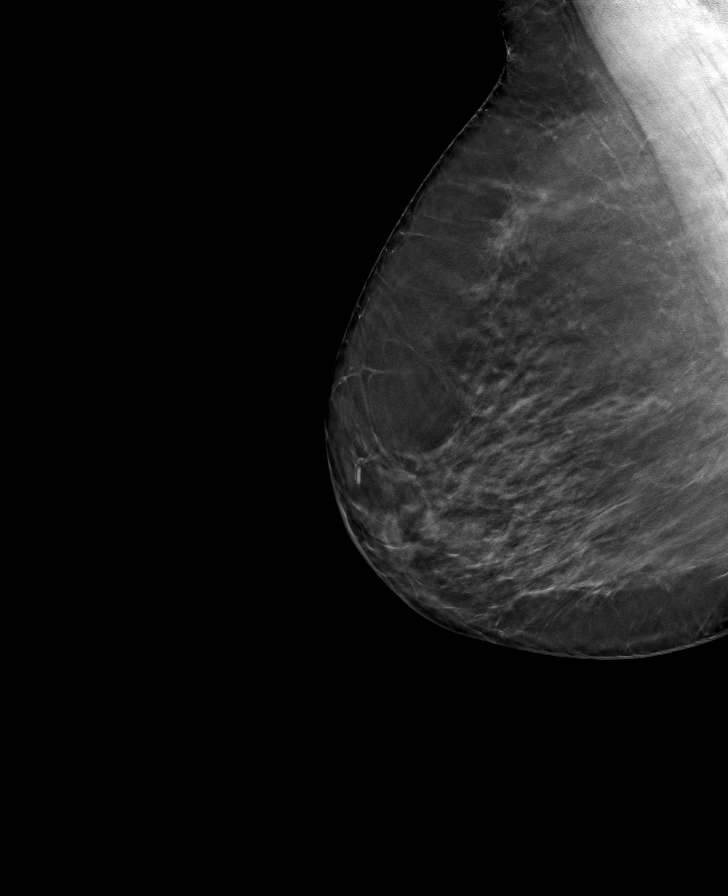

[L CC tomo · tomo slice 37/73.0]
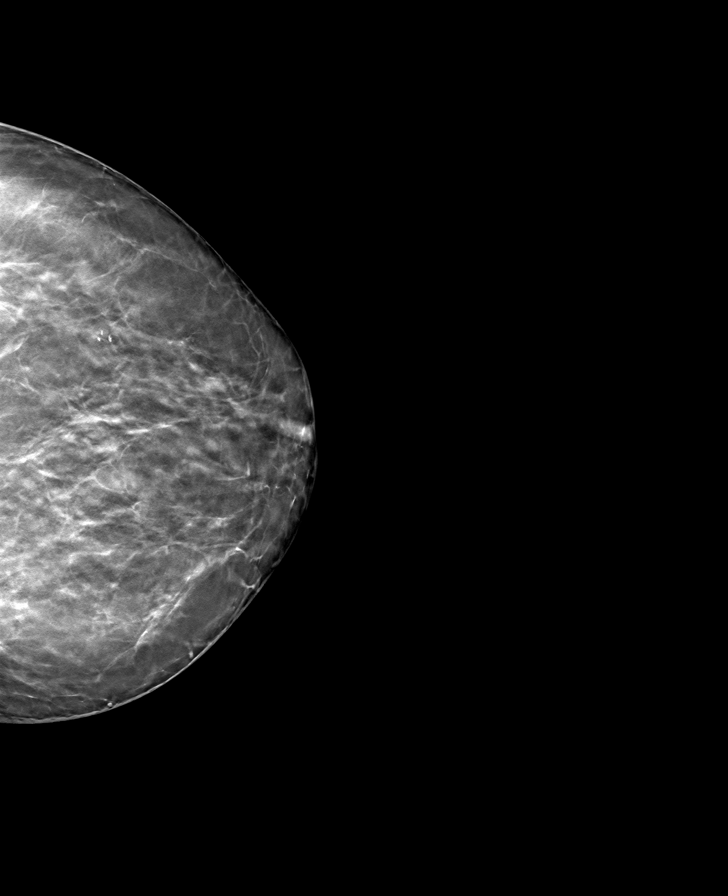

[R CC tomo · tomo slice 36/71.0]
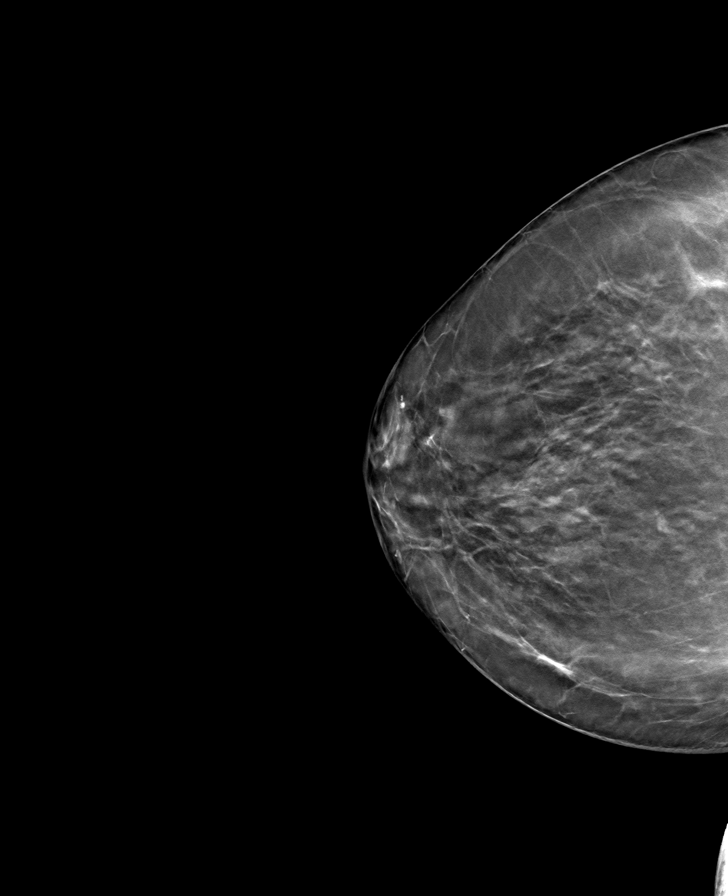

[L MLO tomo · tomo slice 45/88.0]
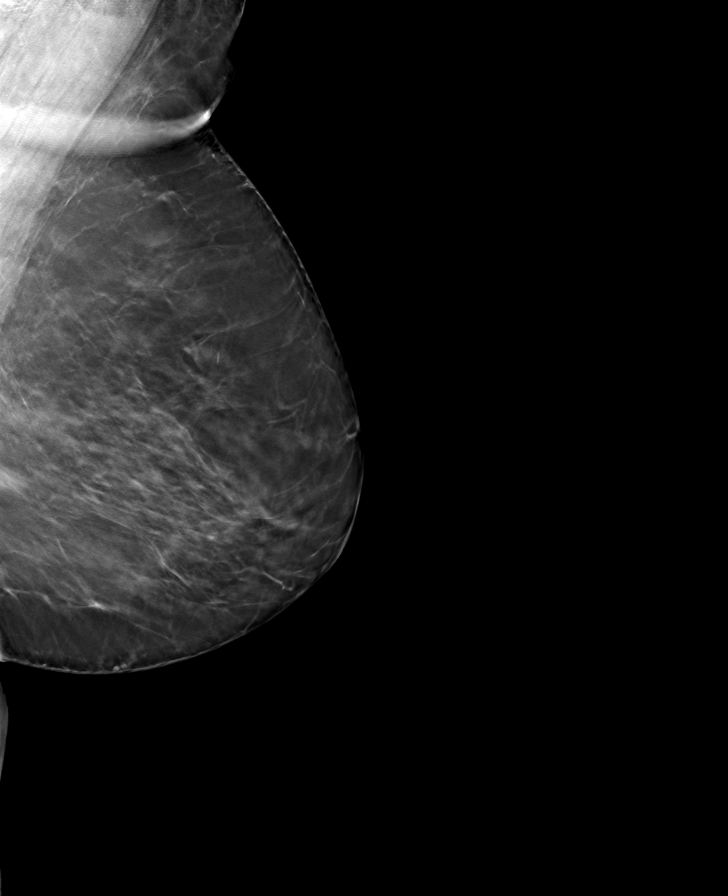

[8 of 24 positions shown; findings below may reference images not displayed]

ACR Breast Density Category b: There are scattered areas of
fibroglandular density.
FINDINGS: In the left breast, a possible asymmetry warrants further
evaluation. In the right breast, no findings suspicious for
malignancy.
IMPRESSION: Further evaluation is suggested for possible asymmetry in the left
breast.

RECOMMENDATION:
Diagnostic mammogram and possibly ultrasound of the left breast.
(Code:[id number])

The patient will be contacted regarding the findings, and additional
imaging will be scheduled.

BI-RADS CATEGORY  0: Incomplete. Need additional imaging evaluation
and/or prior mammograms for comparison.

## 2022-12-26 DIAGNOSIS — M2550 Pain in unspecified joint: Secondary | ICD-10-CM | POA: Diagnosis not present

## 2022-12-26 DIAGNOSIS — E7800 Pure hypercholesterolemia, unspecified: Secondary | ICD-10-CM | POA: Diagnosis not present

## 2022-12-26 DIAGNOSIS — E559 Vitamin D deficiency, unspecified: Secondary | ICD-10-CM | POA: Diagnosis not present

## 2022-12-26 DIAGNOSIS — Z79899 Other long term (current) drug therapy: Secondary | ICD-10-CM | POA: Diagnosis not present

## 2022-12-26 DIAGNOSIS — G309 Alzheimer's disease, unspecified: Secondary | ICD-10-CM | POA: Diagnosis not present

## 2022-12-26 DIAGNOSIS — N1832 Chronic kidney disease, stage 3b: Secondary | ICD-10-CM | POA: Diagnosis not present

## 2022-12-26 DIAGNOSIS — I119 Hypertensive heart disease without heart failure: Secondary | ICD-10-CM | POA: Diagnosis not present

## 2022-12-26 DIAGNOSIS — G40909 Epilepsy, unspecified, not intractable, without status epilepticus: Secondary | ICD-10-CM | POA: Diagnosis not present

## 2022-12-26 DIAGNOSIS — M1990 Unspecified osteoarthritis, unspecified site: Secondary | ICD-10-CM | POA: Diagnosis not present

## 2023-02-12 ENCOUNTER — Emergency Department (HOSPITAL_COMMUNITY): Payer: Medicare Other

## 2023-02-12 ENCOUNTER — Inpatient Hospital Stay (HOSPITAL_COMMUNITY): Payer: Medicare Other

## 2023-02-12 ENCOUNTER — Other Ambulatory Visit: Payer: Self-pay

## 2023-02-12 ENCOUNTER — Encounter (HOSPITAL_COMMUNITY): Payer: Self-pay

## 2023-02-12 ENCOUNTER — Inpatient Hospital Stay (HOSPITAL_COMMUNITY)
Admission: EM | Admit: 2023-02-12 | Discharge: 2023-02-22 | DRG: 493 | Disposition: A | Discharge status: Skilled Nursing Facility | Payer: Medicare Other | Attending: Family Medicine | Admitting: Family Medicine

## 2023-02-12 DIAGNOSIS — N184 Chronic kidney disease, stage 4 (severe): Secondary | ICD-10-CM | POA: Diagnosis present

## 2023-02-12 DIAGNOSIS — M80072A Age-related osteoporosis with current pathological fracture, left ankle and foot, initial encounter for fracture: Secondary | ICD-10-CM | POA: Diagnosis present

## 2023-02-12 DIAGNOSIS — R001 Bradycardia, unspecified: Secondary | ICD-10-CM | POA: Diagnosis present

## 2023-02-12 DIAGNOSIS — M80062A Age-related osteoporosis with current pathological fracture, left lower leg, initial encounter for fracture: Secondary | ICD-10-CM | POA: Diagnosis present

## 2023-02-12 DIAGNOSIS — S82892A Other fracture of left lower leg, initial encounter for closed fracture: Secondary | ICD-10-CM

## 2023-02-12 DIAGNOSIS — S82899S Other fracture of unspecified lower leg, sequela: Secondary | ICD-10-CM | POA: Diagnosis not present

## 2023-02-12 DIAGNOSIS — S82302A Unspecified fracture of lower end of left tibia, initial encounter for closed fracture: Principal | ICD-10-CM

## 2023-02-12 DIAGNOSIS — E785 Hyperlipidemia, unspecified: Secondary | ICD-10-CM | POA: Diagnosis present

## 2023-02-12 DIAGNOSIS — I129 Hypertensive chronic kidney disease with stage 1 through stage 4 chronic kidney disease, or unspecified chronic kidney disease: Secondary | ICD-10-CM | POA: Diagnosis present

## 2023-02-12 DIAGNOSIS — N179 Acute kidney failure, unspecified: Secondary | ICD-10-CM | POA: Diagnosis not present

## 2023-02-12 DIAGNOSIS — Z9181 History of falling: Secondary | ICD-10-CM | POA: Diagnosis not present

## 2023-02-12 DIAGNOSIS — G4700 Insomnia, unspecified: Secondary | ICD-10-CM | POA: Diagnosis present

## 2023-02-12 DIAGNOSIS — S82832A Other fracture of upper and lower end of left fibula, initial encounter for closed fracture: Secondary | ICD-10-CM | POA: Diagnosis not present

## 2023-02-12 DIAGNOSIS — E876 Hypokalemia: Secondary | ICD-10-CM | POA: Diagnosis present

## 2023-02-12 DIAGNOSIS — S42009A Fracture of unspecified part of unspecified clavicle, initial encounter for closed fracture: Secondary | ICD-10-CM | POA: Insufficient documentation

## 2023-02-12 DIAGNOSIS — I10 Essential (primary) hypertension: Secondary | ICD-10-CM | POA: Diagnosis not present

## 2023-02-12 DIAGNOSIS — W19XXXA Unspecified fall, initial encounter: Secondary | ICD-10-CM

## 2023-02-12 DIAGNOSIS — F03A18 Unspecified dementia, mild, with other behavioral disturbance: Secondary | ICD-10-CM | POA: Diagnosis present

## 2023-02-12 DIAGNOSIS — Z87820 Personal history of traumatic brain injury: Secondary | ICD-10-CM | POA: Diagnosis not present

## 2023-02-12 DIAGNOSIS — D649 Anemia, unspecified: Secondary | ICD-10-CM | POA: Insufficient documentation

## 2023-02-12 DIAGNOSIS — D62 Acute posthemorrhagic anemia: Secondary | ICD-10-CM | POA: Diagnosis not present

## 2023-02-12 DIAGNOSIS — S82899A Other fracture of unspecified lower leg, initial encounter for closed fracture: Secondary | ICD-10-CM | POA: Diagnosis present

## 2023-02-12 DIAGNOSIS — Z79899 Other long term (current) drug therapy: Secondary | ICD-10-CM

## 2023-02-12 DIAGNOSIS — S42035A Nondisplaced fracture of lateral end of left clavicle, initial encounter for closed fracture: Secondary | ICD-10-CM

## 2023-02-12 DIAGNOSIS — M810 Age-related osteoporosis without current pathological fracture: Secondary | ICD-10-CM | POA: Insufficient documentation

## 2023-02-12 DIAGNOSIS — D509 Iron deficiency anemia, unspecified: Secondary | ICD-10-CM

## 2023-02-12 DIAGNOSIS — W010XXA Fall on same level from slipping, tripping and stumbling without subsequent striking against object, initial encounter: Secondary | ICD-10-CM | POA: Diagnosis present

## 2023-02-12 DIAGNOSIS — Z7982 Long term (current) use of aspirin: Secondary | ICD-10-CM

## 2023-02-12 DIAGNOSIS — M80012A Age-related osteoporosis with current pathological fracture, left shoulder, initial encounter for fracture: Secondary | ICD-10-CM | POA: Diagnosis present

## 2023-02-12 DIAGNOSIS — G40909 Epilepsy, unspecified, not intractable, without status epilepticus: Secondary | ICD-10-CM | POA: Diagnosis present

## 2023-02-12 LAB — CBC
HCT: 35.7 % — ABNORMAL LOW (ref 36.0–46.0)
Hemoglobin: 11.1 g/dL — ABNORMAL LOW (ref 12.0–15.0)
MCH: 30.7 pg (ref 26.0–34.0)
MCHC: 31.1 g/dL (ref 30.0–36.0)
MCV: 98.9 fL (ref 80.0–100.0)
Platelets: 174 10*3/uL (ref 150–400)
RBC: 3.61 MIL/uL — ABNORMAL LOW (ref 3.87–5.11)
RDW: 14.6 % (ref 11.5–15.5)
WBC: 9.1 10*3/uL (ref 4.0–10.5)
nRBC: 0 % (ref 0.0–0.2)

## 2023-02-12 LAB — VITAMIN D 25 HYDROXY (VIT D DEFICIENCY, FRACTURES): Vit D, 25-Hydroxy: 69.48 ng/mL (ref 30–100)

## 2023-02-12 LAB — BASIC METABOLIC PANEL
Anion gap: 10 (ref 5–15)
BUN: 17 mg/dL (ref 8–23)
CO2: 26 mmol/L (ref 22–32)
Calcium: 8.7 mg/dL — ABNORMAL LOW (ref 8.9–10.3)
Chloride: 106 mmol/L (ref 98–111)
Creatinine, Ser: 1.15 mg/dL — ABNORMAL HIGH (ref 0.44–1.00)
GFR, Estimated: 51 mL/min — ABNORMAL LOW (ref >60)
Glucose, Bld: 96 mg/dL (ref 70–99)
Potassium: 3.2 mmol/L — ABNORMAL LOW (ref 3.5–5.1)
Sodium: 142 mmol/L (ref 135–145)

## 2023-02-12 LAB — SURGICAL PCR SCREEN
MRSA, PCR: NEGATIVE
Staphylococcus aureus: NEGATIVE

## 2023-02-12 MED ORDER — SENNOSIDES-DOCUSATE SODIUM 8.6-50 MG PO TABS
1.0000 | ORAL_TABLET | Freq: Every day | ORAL | Status: DC
  Administered 2023-02-12 – 2023-02-21 (×10): 1 via ORAL
  Filled 2023-02-12 (×10): qty 1

## 2023-02-12 MED ORDER — OXYCODONE HCL 5 MG PO TABS
5.0000 mg | ORAL_TABLET | Freq: Four times a day (QID) | ORAL | Status: DC | PRN
  Administered 2023-02-12 – 2023-02-19 (×6): 5 mg via ORAL
  Filled 2023-02-12 (×6): qty 1

## 2023-02-12 MED ORDER — LOSARTAN POTASSIUM 50 MG PO TABS
100.0000 mg | ORAL_TABLET | Freq: Every day | ORAL | Status: DC
  Administered 2023-02-12 – 2023-02-18 (×7): 100 mg via ORAL
  Filled 2023-02-12 (×8): qty 2

## 2023-02-12 MED ORDER — FENTANYL CITRATE PF 50 MCG/ML IJ SOSY
50.0000 ug | PREFILLED_SYRINGE | Freq: Once | INTRAMUSCULAR | Status: DC
Start: 2023-02-12 — End: 2023-02-12
  Filled 2023-02-12: qty 1

## 2023-02-12 MED ORDER — HYDROCHLOROTHIAZIDE 12.5 MG PO TABS
12.5000 mg | ORAL_TABLET | Freq: Every day | ORAL | Status: DC
  Administered 2023-02-12 – 2023-02-18 (×7): 12.5 mg via ORAL
  Filled 2023-02-12 (×8): qty 1

## 2023-02-12 MED ORDER — OYSTER SHELL CALCIUM/D3 500-5 MG-MCG PO TABS
2.0000 | ORAL_TABLET | Freq: Every day | ORAL | Status: DC
  Administered 2023-02-13 – 2023-02-22 (×10): 2 via ORAL
  Filled 2023-02-12 (×10): qty 2

## 2023-02-12 MED ORDER — LAMOTRIGINE 100 MG PO TABS
100.0000 mg | ORAL_TABLET | Freq: Two times a day (BID) | ORAL | Status: DC
  Administered 2023-02-12 – 2023-02-22 (×21): 100 mg via ORAL
  Filled 2023-02-12 (×22): qty 1

## 2023-02-12 MED ORDER — DONEPEZIL HCL 10 MG PO TABS
10.0000 mg | ORAL_TABLET | Freq: Every morning | ORAL | Status: DC
  Administered 2023-02-13 – 2023-02-22 (×10): 10 mg via ORAL
  Filled 2023-02-12 (×10): qty 1

## 2023-02-12 MED ORDER — QUETIAPINE FUMARATE 25 MG PO TABS
25.0000 mg | ORAL_TABLET | Freq: Every day | ORAL | Status: DC
  Administered 2023-02-12: 25 mg via ORAL
  Filled 2023-02-12: qty 1

## 2023-02-12 MED ORDER — OXYCODONE HCL 5 MG PO TABS
5.0000 mg | ORAL_TABLET | Freq: Once | ORAL | Status: AC
  Administered 2023-02-12: 5 mg via ORAL
  Filled 2023-02-12: qty 1

## 2023-02-12 MED ORDER — POTASSIUM CHLORIDE 20 MEQ PO PACK
40.0000 meq | PACK | Freq: Once | ORAL | Status: AC
  Administered 2023-02-12: 40 meq via ORAL
  Filled 2023-02-12: qty 2

## 2023-02-12 MED ORDER — ASPIRIN 81 MG PO TBEC
81.0000 mg | DELAYED_RELEASE_TABLET | Freq: Every day | ORAL | Status: DC
  Administered 2023-02-12 – 2023-02-13 (×2): 81 mg via ORAL
  Filled 2023-02-12 (×2): qty 1

## 2023-02-12 MED ORDER — ENOXAPARIN SODIUM 40 MG/0.4ML IJ SOSY
40.0000 mg | PREFILLED_SYRINGE | INTRAMUSCULAR | Status: DC
  Administered 2023-02-12 – 2023-02-18 (×7): 40 mg via SUBCUTANEOUS
  Filled 2023-02-12 (×7): qty 0.4

## 2023-02-12 MED ORDER — LOSARTAN POTASSIUM-HCTZ 100-12.5 MG PO TABS: 1.0000 | ORAL_TABLET | Freq: Every day | ORAL | Status: DC

## 2023-02-12 MED ORDER — OXYCODONE HCL 5 MG PO TABS: 5.0000 mg | ORAL_TABLET | ORAL | Status: DC | PRN

## 2023-02-12 MED ORDER — MEMANTINE HCL 10 MG PO TABS
10.0000 mg | ORAL_TABLET | Freq: Two times a day (BID) | ORAL | Status: DC
  Administered 2023-02-12 – 2023-02-22 (×21): 10 mg via ORAL
  Filled 2023-02-12 (×21): qty 1

## 2023-02-12 MED ORDER — ACETAMINOPHEN 500 MG PO TABS
1000.0000 mg | ORAL_TABLET | Freq: Four times a day (QID) | ORAL | Status: DC
  Administered 2023-02-12 – 2023-02-22 (×33): 1000 mg via ORAL
  Filled 2023-02-12 (×35): qty 2

## 2023-02-12 MED ORDER — HEPARIN SODIUM (PORCINE) 5000 UNIT/ML IJ SOLN
5000.0000 [IU] | Freq: Three times a day (TID) | INTRAMUSCULAR | Status: DC
  Filled 2023-02-12: qty 1

## 2023-02-12 MED ORDER — ATORVASTATIN CALCIUM 10 MG PO TABS
10.0000 mg | ORAL_TABLET | ORAL | Status: DC
  Administered 2023-02-13 – 2023-02-22 (×10): 10 mg via ORAL
  Filled 2023-02-12 (×10): qty 1

## 2023-02-12 MED ORDER — POLYETHYLENE GLYCOL 3350 17 G PO PACK
17.0000 g | PACK | Freq: Every day | ORAL | Status: DC
  Administered 2023-02-13 – 2023-02-22 (×10): 17 g via ORAL
  Filled 2023-02-12 (×11): qty 1

## 2023-02-12 NOTE — H&P (Signed)
Hospital Admission History and Physical Service Pager: [phone]  Patient name: Deborah Webb Medical record number: [id number] Date of Birth: 09-17-1950 Age: 72 y.o. Gender: female  Primary Care Provider: Corine Shelter, MD Consultants: Ortho Code Status: Full Preferred Emergency Contact:  Contact Information     Name Relation Home Work Mobile   Tholl,[name] Spouse [phone]      Other Contacts   None on File      Chief Complaint: L ankle and L clavicular fracture  Assessment and Plan: Deborah Webb is a 72 y.o. female w/ hx of dementia, HTN, HLD, seizures presenting after fall from standing w/ L tib/fib and L clavicular fx.  Assessment & Plan Ankle fracture S/p reduction and splinting in ED. Ortho consulted, initially planned for surgery tomorrow, but was rescheduled for next week due to edema.  Given clavicular pain and ankle pain, concerned that husband is not able to take care of patient at home alone.  Will admit for pain control and PT OT evaluation and treatment. - Admit to Med Surg FMTS w/ attending Dr. Manson Passey - Pain:  - tylenol 1000 q6h sch - oxy 5 mg q6h prn - Bowel reg: Miralax daily - PT/OT - f/u CXR  - f/u CT tib fib - Ortho consulted, appreciate recs. Clavicular fracture Placed in sling in ED. Pain control and PT/OT as above.  Fall As were etiology for patient's fall, suspect deconditioning.  Neuroexam negative and CT head negative, making TIA and stroke less likely.  Patient HR also in 50s on admission, bradycardia could be contributing to her fall risk.  Appears that patient is not taking her home metoprolol, this was not continued.  BP elevated, will continue other home BP meds (appears is not taking her amlodipine either. - EKG - PT/OT - Fall precautions Osteoporosis Her fracture with fall from standing height is diagnostic of osteoporosis.  It appears that patient was previously on high-dose vitamin D, but is not  currently taking.  Will recheck vitamin D level now.  Given history of CKD, could consider starting Prolia as bisphosphonates are renally limited. - f/u Vit D Hypokalemia Incidentally noted on admission, was repleted with 40 p.o. K in ED. - AM BMP, CBC  Chronic and Stable Problems:  Dementia: Home memantine and Aricept HTN: Cont home hyzaar HLD: Home Lipitor and ASA Seizures: Home Lamictal Insomnia: cont home Seroquel  FEN/GI: Regular VTE Prophylaxis: Lovenox  Disposition: Med Surg  History of Present Illness:  Deborah Webb is a 72 y.o. female presenting with L ankle and L clavicular fracture.   Patient reports that she was in the bathroom this morning at the sink, she was reaching over to get something, then lost balance and fell.  She remembers falling, she denies losing consciousness.  She had pain in her left ankle and left shoulder, and was not able to get up off the floor.  Husband was at home and called EMS. They report that she was complaining of dizziness yesterday.  Patient has a walker, but does not use it.  They live on the ground floor, no stairs in the house.  Patient does not know if she has osteoporosis.  ED Course: Imaging notable for displaced fracture of L tib/fib, nondisplaced medial malleolar fx, nonidsplaced L distal clavicular fx, possible nondisplaced 2nd metatarsal L foot fx. CT head and C spine neg for acute changes. ED provider reduced L ankle deformity and splinted. Ortho consulted, and plan for surgery tomorrow.  Pain treated with  fentanyl and oxy. K was repleted.  Pertinent Past Medical History: CKD 4 Dementia HTN HLD Seizures Insomnia  Remainder reviewed in history tab.   Pertinent Past Surgical History: Brain surgery 1968  Remainder reviewed in history tab.   Pertinent Social History: Lives with husband  Pertinent Family History: Mom - breast cancer  Remainder reviewed in history tab.   Important Outpatient  Medications: ASA Lipitor Aricept Lamictal Hyzaar Memantine Seroquel Remainder reviewed in medication history.   Objective: BP (!) 175/82 (BP Location: Right Arm)   Pulse 61   Temp 98.2 F (36.8 C) (Oral)   Resp 18   Ht 5\' 11"  (1.803 m)   Wt 85.7 kg   SpO2 100%   BMI 26.35 kg/m  Exam: General: Alert, well-appearing, friendly older woman laying in bed.  NAD. Neuro: Wiggles BL toes.  CN2: no vision changes CN3,4,6: PERRLA. EOMI CN5: Sensation intact BL CN7: Facial expressions symmetric CN8: Hearing intact BL CN9: palate symmetric  CN10: regular speech CN11: turns head against resistance CN12: tongue midline  HEENT: Abrasion on R forehead with surround echymosses.  Cardiovascular: RRR, no murmurs Respiratory: CTAB. Normal WOB on RA Gastrointestinal: Soft, nontender, nondistended.  Normal BS MSK: Ecchymosis with overlying tenderness at location of distal left clavicle.  Left foot wrapped in bandaging.  Able to wiggle bilateral toes.  Cap refill within normal limits on left toes.  Labs:  CBC BMET  Recent Labs  Lab 02/12/23 0947  WBC 9.1  HGB 11.1*  HCT 35.7*  PLT 174   Recent Labs  Lab 02/12/23 0947  NA 142  K 3.2*  CL 106  CO2 26  BUN 17  CREATININE 1.15*  GLUCOSE 96  CALCIUM 8.7*     Imaging Studies Performed: CT head:   1. No evidence of an acute intracranial abnormality. 2. Extensive chronic encephalomalacia/gliosis again noted within the left cerebral hemisphere (with overlying craniectomy). 3. Mild-to-moderate chronic small vessel ischemic changes within the cerebral white matter. 4. Known chronic bilateral thalamic lacunar infarcts. 5. Known chronic CSF density subdural hygromas/hematomas overlying the bilateral cerebellar hemispheres.   CT cervical spine:   1. No evidence of an acute cervical spine fracture. 2. Mild chronic anterior wedge deformity of the C5 vertebral body, unchanged from the prior CTA neck of 08/19/2016. 3.  Levocurvature of the cervical spine. 4. Cervical spondylosis as described.  L Hand XR: 1. Age indeterminate fracture involving an osteophyte arising off the dorsal base of the first distal phalanx. 2. Remote healed fracture involving the distal radius. 3. Scattered degenerative changes.  L Ankle XR: 1. Displaced and comminuted distal tibial and fibular shaft fractures. 2. Nondisplaced medial malleolar fracture.  L Foot XR: 1. Possible nondisplaced transverse fracture at the base of the second metatarsal. 2. Distal tibial and fibular shaft fractures, better seen and described on dedicated views done the same day.  L Humerus XR: Nondisplaced lateral clavicle fracture 1 cm from the [facility] joint.   L Knee XR: No acute findings.   L Shoulder XR:  Acute, slightly comminuted, nondisplaced fracture deformity of the distal clavicle.  L Tib/Fib XR: 1. Comminuted fractures of the distal tibial and fibular shafts. 2. Minimally displaced and transversely oriented fracture of the medial malleolus.   Lincoln Brigham, MD 02/12/2023, 12:37 PM PGY-2, [facility]  FPTS Intern pager: [phone], text pages welcome Secure chat group [facility] Teaching Service

## 2023-02-12 NOTE — Consult Note (Signed)
Reason for Consult:Polytrauma Referring Physician: Kristine Royal Time called: 1610 Time at bedside: 1010   Deborah Webb is an 72 y.o. female.  HPI: Deborah Webb was going to wash her hands after using the bathroom this morning when she lost her balance and fell. She had immediate left ankle and shoulder pain and could not get up. She was brought to the ED where x-rays showed a distal clavicle and ankle fx and orthopedic surgery was consulted. She lives at home with her husband, does not work, and does not use any assistive devices. She is RHD.  Past Medical History:  Diagnosis Date   Hyperlipidemia    Hypertension    Seizures (HCC)     Past Surgical History:  Procedure Laterality Date   BRAIN SURGERY  1968   BREAST CYST ASPIRATION Right     Family History  Problem Relation Age of Onset   Breast cancer Mother    Other Father        unsure of medical history    Social History:  reports that she has never smoked. She has never used smokeless tobacco. She reports that she does not drink alcohol and does not use drugs.  Allergies: No Known Allergies  Medications: I have reviewed the patient's current medications.  Results for orders placed or performed during the hospital encounter of 02/12/23 (from the past 48 hour(s))  CBC     Status: Abnormal   Collection Time: 02/12/23  9:47 AM  Result Value Ref Range   WBC 9.1 4.0 - 10.5 K/uL   RBC 3.61 (L) 3.87 - 5.11 MIL/uL   Hemoglobin 11.1 (L) 12.0 - 15.0 g/dL   HCT 96.0 (L) 45.4 - 09.8 %   MCV 98.9 80.0 - 100.0 fL   MCH 30.7 26.0 - 34.0 pg   MCHC 31.1 30.0 - 36.0 g/dL   RDW 11.9 14.7 - 82.9 %   Platelets 174 150 - 400 K/uL   nRBC 0.0 0.0 - 0.2 %    Comment: Performed at [facility], [address] [place], [place] [address]    DG Hand 2 View Left  Result Date: 02/12/2023 CLINICAL DATA:  Status post fall. EXAM: LEFT HAND - 2 VIEW COMPARISON:  01/06/2016 FINDINGS: Remote healed fracture involving the distal radius.  Age indeterminate fracture involving an osteophyte arising off the dorsal base of the first distal phalanx. Scattered degenerative changes are identified involving the PIP and D IP joints as well as the first MCP joint. Soft tissues are unremarkable. IMPRESSION: 1. Age indeterminate fracture involving an osteophyte arising off the dorsal base of the first distal phalanx. 2. Remote healed fracture involving the distal radius. 3. Scattered degenerative changes. Electronically Signed   By: Signa Kell M.D.   On: 02/12/2023 08:55   DG Knee 2 Views Left  Result Date: 02/12/2023 CLINICAL DATA:  Fall, pain. EXAM: LEFT KNEE - 1-2 VIEW COMPARISON:  None Available. FINDINGS: No acute osseous or joint abnormality. Minimal patellofemoral osteophytosis. IMPRESSION: No acute findings. Electronically Signed   By: Leanna Battles M.D.   On: 02/12/2023 08:50   DG Tibia/Fibula Left  Result Date: 02/12/2023 CLINICAL DATA:  Fall, deformity, pain. EXAM: LEFT TIBIA AND FIBULA - 2 VIEW COMPARISON:  None Available. FINDINGS: Displaced distal tibial shaft fracture with a butterfly fragment. 1.8 cm lateral displacement of the distal fracture fragment. Mildly comminuted, angulated and displaced fracture of the distal fibular shaft. Minimally displaced fracture of the medial malleolus transversely oriented. Osteopenia. Fiberglass splint  in place. IMPRESSION: 1. Comminuted fractures of the distal tibial and fibular shafts. 2. Minimally displaced and transversely oriented fracture of the medial malleolus. Electronically Signed   By: Leanna Battles M.D.   On: 02/12/2023 08:50   DG Humerus Left  Result Date: 02/12/2023 CLINICAL DATA:  Fall with bruising to the left shoulder. EXAM: LEFT HUMERUS - 2+ VIEW COMPARISON:  None Available. FINDINGS: Nondisplaced left lateral clavicle fracture, near but not reaching the [facility] joint. Located glenohumeral joint. Intact humerus. IMPRESSION: Nondisplaced lateral clavicle fracture 1 cm from the [facility]  joint. Electronically Signed   By: Tiburcio Pea M.D.   On: 02/12/2023 08:49   DG Foot 2 Views Left  Result Date: 02/12/2023 CLINICAL DATA:  Fall, ankle deformity. EXAM: LEFT FOOT - 2 VIEW COMPARISON:  None Available. FINDINGS: Fiberglass splint in place. Possible nondisplaced transverse fracture at the base of the second metatarsal. Distal tibial and fibular fractures, better seen and described on dedicated views the same day. Osteopenia. Calcaneal spurs. Midfoot degenerative changes. IMPRESSION: 1. Possible nondisplaced transverse fracture at the base of the second metatarsal. 2. Distal tibial and fibular shaft fractures, better seen and described on dedicated views done the same day. Electronically Signed   By: Leanna Battles M.D.   On: 02/12/2023 08:49   DG Shoulder Left  Result Date: 02/12/2023 CLINICAL DATA:  Injury.  Status post fall. EXAM: LEFT SHOULDER - 2+ VIEW COMPARISON:  None Available. FINDINGS: There is an acute, slightly comminuted, nondisplaced fracture deformity involving the distal clavicle. Degenerative type changes are noted at the acromioclavicular joint and glenohumeral joint. No additional fractures. IMPRESSION: Acute, slightly comminuted, nondisplaced fracture deformity of the distal clavicle. Electronically Signed   By: Signa Kell M.D.   On: 02/12/2023 08:48   DG Ankle Complete Left  Result Date: 02/12/2023 CLINICAL DATA:  Fall. EXAM: LEFT ANKLE COMPLETE - 3+ VIEW COMPARISON:  None Available. FINDINGS: Displaced fracture of the distal tibial shaft with a butterfly fragment. Approximately 1.7 cm lateral displacement of the distal fracture fragment. Mildly comminuted, slightly angulated and minimally displaced fracture of the distal fibular shaft. Nondisplaced transverse fracture of the medial malleolus. Ankle mortise is intact. Osteopenia. Fiberglass splint in place. IMPRESSION: 1. Displaced and comminuted distal tibial and fibular shaft fractures. 2. Nondisplaced medial  malleolar fracture. Electronically Signed   By: Leanna Battles M.D.   On: 02/12/2023 08:47   CT Head Wo Contrast  Result Date: 02/12/2023 CLINICAL DATA:  Provided history: Head trauma minor. Neck trauma. Additional history obtained from electronic MEDICAL RECORD [id number] (with head trauma), right forehead pain. EXAM: CT HEAD WITHOUT CONTRAST CT CERVICAL SPINE WITHOUT CONTRAST TECHNIQUE: Multidetector CT imaging of the head and cervical spine was performed following the standard protocol without intravenous contrast. Multiplanar CT image reconstructions of the cervical spine were also generated. RADIATION DOSE REDUCTION: This exam was performed according to the departmental dose-optimization program which includes automated exposure control, adjustment of the mA and/or kV according to patient size and/or use of iterative reconstruction technique. COMPARISON:  Brain MRI 05/09/2011. Report from head CT 01/21/2008 (images currently unavailable). CTA neck 08/19/2016. FINDINGS: CT HEAD FINDINGS Brain: Extensive chronic encephalomalacia/gliosis again noted within the left cerebral hemisphere, affecting portions of the left frontal, parietal, occipital and temporal lobes. Background mild-to-moderate patchy and ill-defined hypoattenuation within the cerebral white matter, nonspecific but compatible chronic small vessel ischemic disease. Chronic lacunar infarcts within the bilateral thalami, some of which were better appreciated on the prior brain MRI of 05/09/2011. Fairly symmetric chronic  CSF density subdural hygromas/hematomas again noted along the bilateral cerebellar hemispheres (measuring up to 13 mm in thickness). There is no acute intracranial hemorrhage. No acute demarcated cortical infarct. No evidence of an intracranial mass. No midline shift. Vascular: No hyperdense vessel.  Atherosclerotic calcifications. Skull: Prior posterior craniectomy on the left (centered at the parietal calvarium). Right parietal burr  hole. Sinuses/Orbits: No orbital mass or acute orbital finding. No significant paranasal sinus disease. CT CERVICAL SPINE FINDINGS Alignment: Levocurvature of the cervical spine. No significant spondylolisthesis. Skull base and vertebrae: The basion-dental and atlanto-dental intervals are maintained.No evidence of acute fracture to the cervical spine. Mild chronic anterior wedge deformity of the C5 vertebral body, unchanged from the prior CTA neck of 08/19/2016. Soft tissues and spinal canal: No prevertebral fluid or swelling. No visible canal hematoma. Disc levels: Cervical spondylosis with multilevel disc space narrowing, disc bulges/central disc protrusions, endplate spurring and uncovertebral hypertrophy. Multilevel spinal canal narrowing. Most notably, disc bulges (with endplate spurring) contribute to apparent moderate spinal canal stenosis at C4-C5 and C5-C6. Multilevel bony neural foraminal narrowing. Upper chest: No consolidation within the imaged lung apices. No visible pneumothorax. IMPRESSION: CT head: 1. No evidence of an acute intracranial abnormality. 2. Extensive chronic encephalomalacia/gliosis again noted within the left cerebral hemisphere (with overlying craniectomy). 3. Mild-to-moderate chronic small vessel ischemic changes within the cerebral white matter. 4. Known chronic bilateral thalamic lacunar infarcts. 5. Known chronic CSF density subdural hygromas/hematomas overlying the bilateral cerebellar hemispheres. CT cervical spine: 1. No evidence of an acute cervical spine fracture. 2. Mild chronic anterior wedge deformity of the C5 vertebral body, unchanged from the prior CTA neck of 08/19/2016. 3. Levocurvature of the cervical spine. 4. Cervical spondylosis as described. Electronically Signed   By: Jackey Loge D.O.   On: 02/12/2023 08:37   CT Cervical Spine Wo Contrast  Result Date: 02/12/2023 CLINICAL DATA:  Provided history: Head trauma minor. Neck trauma. Additional history obtained  from electronic MEDICAL RECORD NUMBERFall (with head trauma), right forehead pain. EXAM: CT HEAD WITHOUT CONTRAST CT CERVICAL SPINE WITHOUT CONTRAST TECHNIQUE: Multidetector CT imaging of the head and cervical spine was performed following the standard protocol without intravenous contrast. Multiplanar CT image reconstructions of the cervical spine were also generated. RADIATION DOSE REDUCTION: This exam was performed according to the departmental dose-optimization program which includes automated exposure control, adjustment of the mA and/or kV according to patient size and/or use of iterative reconstruction technique. COMPARISON:  Brain MRI 05/09/2011. Report from head CT 01/21/2008 (images currently unavailable). CTA neck 08/19/2016. FINDINGS: CT HEAD FINDINGS Brain: Extensive chronic encephalomalacia/gliosis again noted within the left cerebral hemisphere, affecting portions of the left frontal, parietal, occipital and temporal lobes. Background mild-to-moderate patchy and ill-defined hypoattenuation within the cerebral white matter, nonspecific but compatible chronic small vessel ischemic disease. Chronic lacunar infarcts within the bilateral thalami, some of which were better appreciated on the prior brain MRI of 05/09/2011. Fairly symmetric chronic CSF density subdural hygromas/hematomas again noted along the bilateral cerebellar hemispheres (measuring up to 13 mm in thickness). There is no acute intracranial hemorrhage. No acute demarcated cortical infarct. No evidence of an intracranial mass. No midline shift. Vascular: No hyperdense vessel.  Atherosclerotic calcifications. Skull: Prior posterior craniectomy on the left (centered at the parietal calvarium). Right parietal burr hole. Sinuses/Orbits: No orbital mass or acute orbital finding. No significant paranasal sinus disease. CT CERVICAL SPINE FINDINGS Alignment: Levocurvature of the cervical spine. No significant spondylolisthesis. Skull base and vertebrae:  The basion-dental and atlanto-dental intervals are  maintained.No evidence of acute fracture to the cervical spine. Mild chronic anterior wedge deformity of the C5 vertebral body, unchanged from the prior CTA neck of 08/19/2016. Soft tissues and spinal canal: No prevertebral fluid or swelling. No visible canal hematoma. Disc levels: Cervical spondylosis with multilevel disc space narrowing, disc bulges/central disc protrusions, endplate spurring and uncovertebral hypertrophy. Multilevel spinal canal narrowing. Most notably, disc bulges (with endplate spurring) contribute to apparent moderate spinal canal stenosis at C4-C5 and C5-C6. Multilevel bony neural foraminal narrowing. Upper chest: No consolidation within the imaged lung apices. No visible pneumothorax. IMPRESSION: CT head: 1. No evidence of an acute intracranial abnormality. 2. Extensive chronic encephalomalacia/gliosis again noted within the left cerebral hemisphere (with overlying craniectomy). 3. Mild-to-moderate chronic small vessel ischemic changes within the cerebral white matter. 4. Known chronic bilateral thalamic lacunar infarcts. 5. Known chronic CSF density subdural hygromas/hematomas overlying the bilateral cerebellar hemispheres. CT cervical spine: 1. No evidence of an acute cervical spine fracture. 2. Mild chronic anterior wedge deformity of the C5 vertebral body, unchanged from the prior CTA neck of 08/19/2016. 3. Levocurvature of the cervical spine. 4. Cervical spondylosis as described. Electronically Signed   By: Jackey Loge D.O.   On: 02/12/2023 08:37    Review of Systems  HENT:  Negative for ear discharge, ear pain, hearing loss and tinnitus.   Eyes:  Negative for photophobia and pain.  Respiratory:  Negative for cough and shortness of breath.   Cardiovascular:  Negative for chest pain.  Gastrointestinal:  Negative for abdominal pain, nausea and vomiting.  Genitourinary:  Negative for dysuria, flank pain, frequency and urgency.   Musculoskeletal:  Positive for arthralgias (Left shoulder and ankle). Negative for back pain, myalgias and neck pain.  Neurological:  Negative for dizziness and headaches.  Hematological:  Does not bruise/bleed easily.  Psychiatric/Behavioral:  The patient is not nervous/anxious.    Blood pressure (!) 171/96, pulse (!) 56, temperature (!) 97.4 F (36.3 C), temperature source Oral, resp. rate 18, height 5\' 11"  (1.803 m), weight 85.7 kg, SpO2 100%. Physical Exam Constitutional:      General: She is not in acute distress.    Appearance: She is well-developed. She is not diaphoretic.  HENT:     Head: Normocephalic and atraumatic.  Eyes:     General: No scleral icterus.       Right eye: No discharge.        Left eye: No discharge.     Conjunctiva/sclera: Conjunctivae normal.  Cardiovascular:     Rate and Rhythm: Normal rate and regular rhythm.  Pulmonary:     Effort: Pulmonary effort is normal. No respiratory distress.  Musculoskeletal:     Cervical back: Normal range of motion.     Comments: Left shoulder, elbow, wrist, digits- no skin wounds, small ecchymosis ant shoulder, mod pain ant shoulder, no instability, no blocks to motion  Sens  Ax/R/M/U intact  Mot   Ax/ R/ PIN/ M/ AIN/ U intact  Rad 2+  LLE No traumatic wounds, ecchymosis, or rash  Short leg splint in place  No knee effusion  Knee stable to varus/ valgus and anterior/posterior stress  Sens DPN, SPN, TN intact  Motor EHL 5/5  Toes perfused, No significant edema  Skin:    General: Skin is warm and dry.  Neurological:     Mental Status: She is alert.  Psychiatric:        Mood and Affect: Mood normal.        Behavior: Behavior normal.  Assessment/Plan: Left clavicle fx -- Stable injury will treat non-operatively with sling and NWB. Left ankle fx -- Plan ORIF tomorrow as long as skin looks ok. Will work on swelling today/tonight.    Freeman Caldron, PA-C Orthopedic Surgery [phone] 02/12/2023,  10:12 AM

## 2023-02-12 NOTE — Progress Notes (Signed)
   FMTS Attending Admission Note: Deborah Starr, MD   For questions about this patient, please use [email or web address] to page the family medicine resident on call. Pager number [phone].    I have seen and examined this patient, and reviewed their chart. I have discussed this patient with the resident.  I will sign the resident's note as it is available  Very pleasant 72 year old woman with history of traumatic brain injury, mild dementia and seizure disorder presenting with a clavicle and left ankle fracture after a fall.  She is joined by her husband.  At baseline she does have a walker and cane at home but does not use them.  She does not become dyspneic or have chest pain when she walks.  She has never had any complications of surgery including difficulty with anesthesia or bleeding.   Past medical history, no significant cardiac or pulmonary disease She did have a history of seizure disorder and memory impairment She is quite advanced CKD stage IV  Surgical history is reviewed  Patient is examined extensive imaging is reviewed.  Left ankle fracture, given her clavicle fracture and left ankle fracture I anticipate she may not be able to return home with her husband and may actually need skilled nursing placement.  Will need PT to assess to see if she is safe to return home.  Will medically optimize her while she is in the hospital obtain EKG and chest x-ray.  Will continue her home medications at this time.  We will elevate her left leg so that perhaps she can actually get surgery earlier in the week rather than next week.  She will need outpatient DEXA scan, start calcium and vitamin D and consideration of an antiresorptive agent such as Prolia as an outpatient.

## 2023-02-12 NOTE — Assessment & Plan Note (Signed)
Her fracture with fall from standing height is diagnostic of osteoporosis.  It appears that patient was previously on high-dose vitamin D, but is not currently taking.  Will recheck vitamin D level now.  Given history of CKD, could consider starting Prolia as bisphosphonates are renally limited. - f/u Vit D

## 2023-02-12 NOTE — Assessment & Plan Note (Signed)
S/p reduction and splinting in ED. Ortho consulted, initially planned for surgery tomorrow, but was rescheduled for next week due to edema.  Given clavicular pain and ankle pain, concerned that husband is not able to take care of patient at home alone.  Will admit for pain control and PT OT evaluation and treatment. - Admit to Med Surg FMTS w/ attending Dr. Manson Passey - Pain:  - tylenol 1000 q6h sch - oxy 5 mg q6h prn - Bowel reg: Miralax daily - PT/OT - f/u CXR  - f/u CT tib fib - Ortho consulted, appreciate recs.

## 2023-02-12 NOTE — Assessment & Plan Note (Signed)
Incidentally noted on admission, was repleted with 40 p.o. K in ED. - AM BMP, CBC

## 2023-02-12 NOTE — Assessment & Plan Note (Signed)
As were etiology for patient's fall, suspect deconditioning.  Neuroexam negative and CT head negative, making TIA and stroke less likely.  Patient HR also in 50s on admission, bradycardia could be contributing to her fall risk.  Appears that patient is not taking her home metoprolol, this was not continued.  BP elevated, will continue other home BP meds (appears is not taking her amlodipine either. - EKG - PT/OT - Fall precautions

## 2023-02-12 NOTE — Plan of Care (Signed)
FMTS Brief Progress Note  S: Patient awake and alert resting comfortably in bed on exam this evening.  She reports that her pain is adequately controlled at this time.  When asked about any history of pain medication use, patient was not able to provide any recollection of having taken pain medications in the past.  She is aware that we will not be able to completely resolve her pain until the swelling comes down and we are able to send her to surgery for the repair of the fractures.  O: BP (!) 170/82 (BP Location: Right Arm)   Pulse 64   Temp 98 F (36.7 C) (Oral)   Resp 18   Ht 5\' 11"  (1.803 m)   Wt 85.7 kg   SpO2 98%   BMI 26.35 kg/m   General: Alert, oriented NAD Cardiac regular rate and rhythm, no M/R/G Respiratory: CTAB, no increased work of breathing Extremities: Left leg wrapped in soft cast, skin over the knee and upper shin is warm and mildly edematous.  Motion of the distal phalanges of the left leg is intact.  A/P: Overall patient is stable and pain is well-controlled at this time. - Orders reviewed. Labs for AM ordered, which was adjusted as needed.  - If condition changes, plan includes reassessment and change to pain medication as necessary.   Gerrit Heck, DO 02/12/2023, 7:34 PM PGY-1, [place] [facility] Night Resident  Please page [phone] with questions.

## 2023-02-12 NOTE — ED Notes (Signed)
ED TO INPATIENT HANDOFF REPORT  ED Nurse Name and Phone #: Jesse Sans, [phone]  S Name/Age/Gender Deborah Webb 72 y.o. female Room/Bed: TRABC/TRABC  Code Status   Code Status: Full Code  Home/SNF/Other Home Patient oriented to: self, place, time, and situation Is this baseline? Yes   Triage Complete: Triage complete  Chief Complaint Ankle fracture [A54.098J]  Triage Note Pt arrived via [facility] from home. Pt was walking to bathroom upon waking and fell and hit left side and right forehead. Pt denies blood thinner. Pt denies LOC. Pt has deformed left ankle. Pt has 2+ swelling of left ankle. Pt has 2+ left pedal pulse, able to wiggle toes. Pt c/o left shoulder pain. Pt has small ecchymotic area of left shoulder. Pt c/o pain in right forehead. Pt is A&Ox4.   Allergies No Known Allergies  Level of Care/Admitting Diagnosis ED Disposition     ED Disposition  Admit   Condition  --   Comment  Hospital Area: [facility] [[id number]]  Level of Care: Med-Surg [16]  May admit patient to [facility] or [facility] if equivalent level of care is available:: No  Covid Evaluation: Asymptomatic - no recent exposure (last 10 days) testing not required  Diagnosis: Ankle fracture [243007]  Admitting Physician: Lincoln Brigham [[id number]]  Attending Physician: Westley Chandler [[id number]]  Certification:: I certify this patient will need inpatient services for at least 2 midnights  Expected Medical Readiness: 02/16/2023          B Medical/Surgery History Past Medical History:  Diagnosis Date   Hyperlipidemia    Hypertension    Seizures (HCC)    Past Surgical History:  Procedure Laterality Date   BRAIN SURGERY  1968   BREAST CYST ASPIRATION Right      A IV Location/Drains/Wounds Patient Lines/Drains/Airways Status     Active Line/Drains/Airways     None            Intake/Output Last 24 hours No intake or output data in the 24 hours ending 02/12/23  1130  Labs/Imaging Results for orders placed or performed during the hospital encounter of 02/12/23 (from the past 48 hour(s))  Basic metabolic panel     Status: Abnormal   Collection Time: 02/12/23  9:47 AM  Result Value Ref Range   Sodium 142 135 - 145 mmol/L   Potassium 3.2 (L) 3.5 - 5.1 mmol/L   Chloride 106 98 - 111 mmol/L   CO2 26 22 - 32 mmol/L   Glucose, Bld 96 70 - 99 mg/dL    Comment: Glucose reference range applies only to samples taken after fasting for at least 8 hours.   BUN 17 8 - 23 mg/dL   Creatinine, Ser 8.65 (H) 0.44 - 1.00 mg/dL   Calcium 8.7 (L) 8.9 - 10.3 mg/dL   GFR, Estimated 51 (L) >60 mL/min    Comment: (NOTE) Calculated using the CKD-EPI Creatinine Equation (2021)    Anion gap 10 5 - 15    Comment: Performed at [facility], [address] [place], [place] [address]  CBC     Status: Abnormal   Collection Time: 02/12/23  9:47 AM  Result Value Ref Range   WBC 9.1 4.0 - 10.5 K/uL   RBC 3.61 (L) 3.87 - 5.11 MIL/uL   Hemoglobin 11.1 (L) 12.0 - 15.0 g/dL   HCT 62.9 (L) 52.8 - 41.3 %   MCV 98.9 80.0 - 100.0 fL   MCH 30.7 26.0 - 34.0  pg   MCHC 31.1 30.0 - 36.0 g/dL   RDW 57.8 46.9 - 62.9 %   Platelets 174 150 - 400 K/uL   nRBC 0.0 0.0 - 0.2 %    Comment: Performed at [facility], [address] [place], [place] [address]   DG Hand 2 View Left  Result Date: 02/12/2023 CLINICAL DATA:  Status post fall. EXAM: LEFT HAND - 2 VIEW COMPARISON:  01/06/2016 FINDINGS: Remote healed fracture involving the distal radius. Age indeterminate fracture involving an osteophyte arising off the dorsal base of the first distal phalanx. Scattered degenerative changes are identified involving the PIP and D IP joints as well as the first MCP joint. Soft tissues are unremarkable. IMPRESSION: 1. Age indeterminate fracture involving an osteophyte arising off the dorsal base of the first distal phalanx. 2. Remote healed fracture involving the distal radius. 3. Scattered  degenerative changes. Electronically Signed   By: Signa Kell M.D.   On: 02/12/2023 08:55   DG Knee 2 Views Left  Result Date: 02/12/2023 CLINICAL DATA:  Fall, pain. EXAM: LEFT KNEE - 1-2 VIEW COMPARISON:  None Available. FINDINGS: No acute osseous or joint abnormality. Minimal patellofemoral osteophytosis. IMPRESSION: No acute findings. Electronically Signed   By: Leanna Battles M.D.   On: 02/12/2023 08:50   DG Tibia/Fibula Left  Result Date: 02/12/2023 CLINICAL DATA:  Fall, deformity, pain. EXAM: LEFT TIBIA AND FIBULA - 2 VIEW COMPARISON:  None Available. FINDINGS: Displaced distal tibial shaft fracture with a butterfly fragment. 1.8 cm lateral displacement of the distal fracture fragment. Mildly comminuted, angulated and displaced fracture of the distal fibular shaft. Minimally displaced fracture of the medial malleolus transversely oriented. Osteopenia. Fiberglass splint in place. IMPRESSION: 1. Comminuted fractures of the distal tibial and fibular shafts. 2. Minimally displaced and transversely oriented fracture of the medial malleolus. Electronically Signed   By: Leanna Battles M.D.   On: 02/12/2023 08:50   DG Humerus Left  Result Date: 02/12/2023 CLINICAL DATA:  Fall with bruising to the left shoulder. EXAM: LEFT HUMERUS - 2+ VIEW COMPARISON:  None Available. FINDINGS: Nondisplaced left lateral clavicle fracture, near but not reaching the [facility] joint. Located glenohumeral joint. Intact humerus. IMPRESSION: Nondisplaced lateral clavicle fracture 1 cm from the [facility] joint. Electronically Signed   By: Tiburcio Pea M.D.   On: 02/12/2023 08:49   DG Foot 2 Views Left  Result Date: 02/12/2023 CLINICAL DATA:  Fall, ankle deformity. EXAM: LEFT FOOT - 2 VIEW COMPARISON:  None Available. FINDINGS: Fiberglass splint in place. Possible nondisplaced transverse fracture at the base of the second metatarsal. Distal tibial and fibular fractures, better seen and described on dedicated views the same day.  Osteopenia. Calcaneal spurs. Midfoot degenerative changes. IMPRESSION: 1. Possible nondisplaced transverse fracture at the base of the second metatarsal. 2. Distal tibial and fibular shaft fractures, better seen and described on dedicated views done the same day. Electronically Signed   By: Leanna Battles M.D.   On: 02/12/2023 08:49   DG Shoulder Left  Result Date: 02/12/2023 CLINICAL DATA:  Injury.  Status post fall. EXAM: LEFT SHOULDER - 2+ VIEW COMPARISON:  None Available. FINDINGS: There is an acute, slightly comminuted, nondisplaced fracture deformity involving the distal clavicle. Degenerative type changes are noted at the acromioclavicular joint and glenohumeral joint. No additional fractures. IMPRESSION: Acute, slightly comminuted, nondisplaced fracture deformity of the distal clavicle. Electronically Signed   By: Signa Kell M.D.   On: 02/12/2023 08:48   DG Ankle Complete Left  Result  Date: 02/12/2023 CLINICAL DATA:  Fall. EXAM: LEFT ANKLE COMPLETE - 3+ VIEW COMPARISON:  None Available. FINDINGS: Displaced fracture of the distal tibial shaft with a butterfly fragment. Approximately 1.7 cm lateral displacement of the distal fracture fragment. Mildly comminuted, slightly angulated and minimally displaced fracture of the distal fibular shaft. Nondisplaced transverse fracture of the medial malleolus. Ankle mortise is intact. Osteopenia. Fiberglass splint in place. IMPRESSION: 1. Displaced and comminuted distal tibial and fibular shaft fractures. 2. Nondisplaced medial malleolar fracture. Electronically Signed   By: Leanna Battles M.D.   On: 02/12/2023 08:47   CT Head Wo Contrast  Result Date: 02/12/2023 CLINICAL DATA:  Provided history: Head trauma minor. Neck trauma. Additional history obtained from electronic MEDICAL RECORD [id number] (with head trauma), right forehead pain. EXAM: CT HEAD WITHOUT CONTRAST CT CERVICAL SPINE WITHOUT CONTRAST TECHNIQUE: Multidetector CT imaging of the head and  cervical spine was performed following the standard protocol without intravenous contrast. Multiplanar CT image reconstructions of the cervical spine were also generated. RADIATION DOSE REDUCTION: This exam was performed according to the departmental dose-optimization program which includes automated exposure control, adjustment of the mA and/or kV according to patient size and/or use of iterative reconstruction technique. COMPARISON:  Brain MRI 05/09/2011. Report from head CT 01/21/2008 (images currently unavailable). CTA neck 08/19/2016. FINDINGS: CT HEAD FINDINGS Brain: Extensive chronic encephalomalacia/gliosis again noted within the left cerebral hemisphere, affecting portions of the left frontal, parietal, occipital and temporal lobes. Background mild-to-moderate patchy and ill-defined hypoattenuation within the cerebral white matter, nonspecific but compatible chronic small vessel ischemic disease. Chronic lacunar infarcts within the bilateral thalami, some of which were better appreciated on the prior brain MRI of 05/09/2011. Fairly symmetric chronic CSF density subdural hygromas/hematomas again noted along the bilateral cerebellar hemispheres (measuring up to 13 mm in thickness). There is no acute intracranial hemorrhage. No acute demarcated cortical infarct. No evidence of an intracranial mass. No midline shift. Vascular: No hyperdense vessel.  Atherosclerotic calcifications. Skull: Prior posterior craniectomy on the left (centered at the parietal calvarium). Right parietal burr hole. Sinuses/Orbits: No orbital mass or acute orbital finding. No significant paranasal sinus disease. CT CERVICAL SPINE FINDINGS Alignment: Levocurvature of the cervical spine. No significant spondylolisthesis. Skull base and vertebrae: The basion-dental and atlanto-dental intervals are maintained.No evidence of acute fracture to the cervical spine. Mild chronic anterior wedge deformity of the C5 vertebral body, unchanged from  the prior CTA neck of 08/19/2016. Soft tissues and spinal canal: No prevertebral fluid or swelling. No visible canal hematoma. Disc levels: Cervical spondylosis with multilevel disc space narrowing, disc bulges/central disc protrusions, endplate spurring and uncovertebral hypertrophy. Multilevel spinal canal narrowing. Most notably, disc bulges (with endplate spurring) contribute to apparent moderate spinal canal stenosis at C4-C5 and C5-C6. Multilevel bony neural foraminal narrowing. Upper chest: No consolidation within the imaged lung apices. No visible pneumothorax. IMPRESSION: CT head: 1. No evidence of an acute intracranial abnormality. 2. Extensive chronic encephalomalacia/gliosis again noted within the left cerebral hemisphere (with overlying craniectomy). 3. Mild-to-moderate chronic small vessel ischemic changes within the cerebral white matter. 4. Known chronic bilateral thalamic lacunar infarcts. 5. Known chronic CSF density subdural hygromas/hematomas overlying the bilateral cerebellar hemispheres. CT cervical spine: 1. No evidence of an acute cervical spine fracture. 2. Mild chronic anterior wedge deformity of the C5 vertebral body, unchanged from the prior CTA neck of 08/19/2016. 3. Levocurvature of the cervical spine. 4. Cervical spondylosis as described. Electronically Signed   By: Jackey Loge D.O.   On: 02/12/2023 08:37  CT Cervical Spine Wo Contrast  Result Date: 02/12/2023 CLINICAL DATA:  Provided history: Head trauma minor. Neck trauma. Additional history obtained from electronic MEDICAL RECORD NUMBERFall (with head trauma), right forehead pain. EXAM: CT HEAD WITHOUT CONTRAST CT CERVICAL SPINE WITHOUT CONTRAST TECHNIQUE: Multidetector CT imaging of the head and cervical spine was performed following the standard protocol without intravenous contrast. Multiplanar CT image reconstructions of the cervical spine were also generated. RADIATION DOSE REDUCTION: This exam was performed according to the  departmental dose-optimization program which includes automated exposure control, adjustment of the mA and/or kV according to patient size and/or use of iterative reconstruction technique. COMPARISON:  Brain MRI 05/09/2011. Report from head CT 01/21/2008 (images currently unavailable). CTA neck 08/19/2016. FINDINGS: CT HEAD FINDINGS Brain: Extensive chronic encephalomalacia/gliosis again noted within the left cerebral hemisphere, affecting portions of the left frontal, parietal, occipital and temporal lobes. Background mild-to-moderate patchy and ill-defined hypoattenuation within the cerebral white matter, nonspecific but compatible chronic small vessel ischemic disease. Chronic lacunar infarcts within the bilateral thalami, some of which were better appreciated on the prior brain MRI of 05/09/2011. Fairly symmetric chronic CSF density subdural hygromas/hematomas again noted along the bilateral cerebellar hemispheres (measuring up to 13 mm in thickness). There is no acute intracranial hemorrhage. No acute demarcated cortical infarct. No evidence of an intracranial mass. No midline shift. Vascular: No hyperdense vessel.  Atherosclerotic calcifications. Skull: Prior posterior craniectomy on the left (centered at the parietal calvarium). Right parietal burr hole. Sinuses/Orbits: No orbital mass or acute orbital finding. No significant paranasal sinus disease. CT CERVICAL SPINE FINDINGS Alignment: Levocurvature of the cervical spine. No significant spondylolisthesis. Skull base and vertebrae: The basion-dental and atlanto-dental intervals are maintained.No evidence of acute fracture to the cervical spine. Mild chronic anterior wedge deformity of the C5 vertebral body, unchanged from the prior CTA neck of 08/19/2016. Soft tissues and spinal canal: No prevertebral fluid or swelling. No visible canal hematoma. Disc levels: Cervical spondylosis with multilevel disc space narrowing, disc bulges/central disc protrusions,  endplate spurring and uncovertebral hypertrophy. Multilevel spinal canal narrowing. Most notably, disc bulges (with endplate spurring) contribute to apparent moderate spinal canal stenosis at C4-C5 and C5-C6. Multilevel bony neural foraminal narrowing. Upper chest: No consolidation within the imaged lung apices. No visible pneumothorax. IMPRESSION: CT head: 1. No evidence of an acute intracranial abnormality. 2. Extensive chronic encephalomalacia/gliosis again noted within the left cerebral hemisphere (with overlying craniectomy). 3. Mild-to-moderate chronic small vessel ischemic changes within the cerebral white matter. 4. Known chronic bilateral thalamic lacunar infarcts. 5. Known chronic CSF density subdural hygromas/hematomas overlying the bilateral cerebellar hemispheres. CT cervical spine: 1. No evidence of an acute cervical spine fracture. 2. Mild chronic anterior wedge deformity of the C5 vertebral body, unchanged from the prior CTA neck of 08/19/2016. 3. Levocurvature of the cervical spine. 4. Cervical spondylosis as described. Electronically Signed   By: Jackey Loge D.O.   On: 02/12/2023 08:37    Pending Labs [employer] (From admission, onward)     Start     Ordered   Signed and Held  CBC  Tomorrow morning,   R        Signed and Held   Signed and Held  Basic metabolic panel  Tomorrow morning,   R        Signed and Held            Vitals/Pain Today's Vitals   02/12/23 0719 02/12/23 0720 02/12/23 0730 02/12/23 1045  BP:  (!) 171/96 (!) 165/94 Marland Kitchen)  176/93  Pulse:  (!) 56 (!) 59 (!) 56  Resp:  18 18 18   Temp:  (!) 97.4 F (36.3 C)  (!) 97.4 F (36.3 C)  TempSrc:  Oral    SpO2:  100% 100% 100%  Weight: 85.7 kg     Height: 5\' 11"  (1.803 m)     PainSc:  9       Isolation Precautions No active isolations  Medications Medications  oxyCODONE (Oxy IR/ROXICODONE) immediate release tablet 5 mg (5 mg Oral Given 02/12/23 0855)  potassium chloride (KLOR-CON) packet 40 mEq (40 mEq  Oral Given 02/12/23 1034)    Mobility non-ambulatory     Focused Assessments Neuro Assessment Handoff:  Swallow screen pass? Yes          Neuro Assessment:   Neuro Checks:      Has TPA been given? No If patient is a Neuro Trauma and patient is going to OR before floor call report to 4N Charge nurse: [phone] or [phone]   R Recommendations: See Admitting Provider Note  Report given to:   Additional Notes: pt AAOx4.

## 2023-02-12 NOTE — Progress Notes (Signed)
Transition of Care [facility]) - Inpatient Brief Assessment   Patient Details  Name: Deborah Webb MRN: [id number] Date of Birth: 01-11-1951  Transition of [facility]) CM/SW Contact:    Ronny Bacon, RN Phone Number: 02/12/2023, 12:39 PM   Clinical Narrative:  Patient from home with spouse, in with fall causing left ankle fracture and clavicle fracture. Patient expected to go to OR 02/13/2023 for ORIF of Left Ankle. Patient has used [facility] in the past, last used in 2018. TOC will follow to watch for any discharge needs.  Transition of Care Asessment: Insurance and Status: (P) Insurance coverage has been reviewed Patient has primary care physician: (P) Yes Home environment has been reviewed: (P) Home Prior level of function:: (P) Independent Prior/Current Home Services: (P) No current home services ([facility] in the past (2018)) Social Determinants of Health Reivew: (P) SDOH reviewed no interventions necessary Readmission risk has been reviewed: (P) Yes Transition of care needs: (P) transition of care needs identified, TOC will continue to follow

## 2023-02-12 NOTE — Assessment & Plan Note (Signed)
Placed in sling in ED. Pain control and PT/OT as above.

## 2023-02-12 NOTE — Progress Notes (Signed)
Orthopedic Tech Progress Note Patient Details:  Deborah Webb 1950/03/14 [id number]  Ortho Devices Type of Ortho Device: Arm sling Ortho Device/Splint Location: LUE Ortho Device/Splint Interventions: Application   Post Interventions Patient Tolerated: Well  Merel Santoli E Saul Dorsi 02/12/2023, 11:05 AM

## 2023-02-12 NOTE — ED Triage Notes (Signed)
Pt arrived [facility] from [facility]. Pt was walking to bathroom upon waking and fell and hit left side and right forehead. Pt denies blood thinner. Pt denies LOC. Pt has deformed left ankle. Pt has 2+ swelling of left ankle. Pt has 2+ left pedal pulse, able to wiggle toes. Pt c/o left shoulder pain. Pt has small ecchymotic area of left shoulder. Pt c/o pain in right forehead. Pt is A&Ox4.

## 2023-02-12 NOTE — ED Provider Notes (Signed)
[place] [facility]ERG[facility] DEPARTMENT AT [facility] Provider Note   CSN: [id number] Arrival date & time: 02/12/23  0715     History  Chief Complaint  Patient presents with   Fall   Ankle Injury    Deborah Webb is a 72 y.o. female.  72 year old female with a past medical history of dementia, HTN presents here after mechanical fall.  Patient states that she was in the bathroom when she caught her foot, which caused her to fall.  She landed on her left side.  She denies hitting her head.  No LOC.  Patient denies any preceding chest pain, shortness of breath, lightheadedness, dizziness.  She is complaining of pain in her left shoulder and her left ankle.  The history is provided by the spouse and the patient.       Home Medications Prior to Admission medications   Medication Sig Start Date End Date Taking? Authorizing Provider  aspirin EC 81 MG tablet Take 81 mg by mouth daily.   Yes [provider]  atorvastatin (LIPITOR) 10 MG tablet Take 10 mg by mouth every morning.    Yes [provider]  donepezil (ARICEPT) 10 MG tablet Take 1 tablet (10 mg total) by mouth in the morning. 08/10/22  Yes Glean Salvo, NP  lamoTRIgine (LAMICTAL) 100 MG tablet Take 1 tablet (100 mg total) by mouth 2 (two) times daily. 06/12/22  Yes Levert Feinstein, MD  losartan-hydrochlorothiazide (HYZAAR) 100-12.5 MG tablet Take 1 tablet by mouth daily. 12/22/22  Yes [provider]  memantine (NAMENDA) 10 MG tablet TAKE 1 TABLET TWICE DAILY 11/29/22  Yes Glean Salvo, NP  QUEtiapine (SEROQUEL) 25 MG tablet Take 1 tablet (25 mg total) by mouth at bedtime. 08/10/22  Yes Glean Salvo, NP  Vitamin D, Ergocalciferol, (DRISDOL) 1.25 MG (50000 UNIT) CAPS capsule Take 50,000 Units by mouth once a week. 09/19/22  Yes [provider]  amLODipine (NORVASC) 10 MG tablet Take 10 mg by mouth daily. Patient not taking: Reported on 02/12/2023 10/16/22   [provider]  metoprolol  succinate (TOPROL-XL) 50 MG 24 hr tablet Take 50 mg by mouth daily. Patient not taking: Reported on 02/12/2023 10/16/22   [provider]  metoprolol tartrate (LOPRESSOR) 50 MG tablet Take 50 mg by mouth 2 (two) times daily. Patient not taking: Reported on 02/12/2023 10/23/22   [provider]      Allergies    Patient has no known allergies.    Review of Systems   As noted in HPI  Physical Exam Updated Vital Signs BP (!) 176/93   Pulse (!) 56   Temp (!) 97.4 F (36.3 C)   Resp 18   Ht 5\' 11"  (1.803 m)   Wt 85.7 kg   SpO2 100%   BMI 26.35 kg/m  Physical Exam Vitals reviewed.  Constitutional:      General: She is not in acute distress.    Appearance: She is normal weight. She is not ill-appearing, toxic-appearing or diaphoretic.  HENT:     Head: Normocephalic and atraumatic.  Cardiovascular:     Rate and Rhythm: Normal rate and regular rhythm.     Pulses: Normal pulses.          Radial pulses are 2+ on the right side and 2+ on the left side.       Dorsalis pedis pulses are 2+ on the right side and 2+ on the left side.     Heart sounds:  Normal heart sounds. No murmur heard.    No friction rub. No gallop.  Pulmonary:     Effort: Pulmonary effort is normal. No respiratory distress.     Breath sounds: Normal breath sounds. No wheezing, rhonchi or rales.  Chest:     Comments: No tenderness to palpation of anterior chest wall. Abdominal:     General: There is no distension.     Palpations: Abdomen is soft.     Tenderness: There is no abdominal tenderness. There is no guarding or rebound.  Musculoskeletal:     Right shoulder: Normal.     Left shoulder: Tenderness present. No deformity.     Right upper arm: Normal.     Left upper arm: Tenderness present. No deformity.     Right elbow: Normal.     Left elbow: Normal.     Right forearm: Normal.     Left forearm: Normal.     Right wrist: Normal.     Left wrist: Normal. No snuff box tenderness.     Left hand:  Tenderness present.     Cervical back: No bony tenderness (No midline C-spine tenderness).     Right hip: Normal.     Left hip: Normal.     Right upper leg: Normal.     Left upper leg: Tenderness present.     Right knee: Normal.     Left knee: Normal.     Right lower leg: No edema.     Left lower leg: No edema.     Left ankle: Swelling and deformity present. Tenderness present.     Right foot: Normal.     Left foot: Tenderness present.     Comments: Tenderness to palpation diffusely through the left hand.  Proximal left humerus and left shoulder tender to palpation.  Bruising to the anterior portion of the left shoulder.  Patient has left ankle deformity with tenderness to the distal portion of the tip/fib, left ankle, lateral portion of left foot.  Skin:    General: Skin is warm and dry.  Neurological:     Mental Status: She is alert.     Comments: Sensation intact to left foot.     ED Results / Procedures / Treatments   Labs (all labs ordered are listed, but only abnormal results are displayed) Labs Reviewed  BASIC METABOLIC PANEL - Abnormal; Notable for the following components:      Result Value   Potassium 3.2 (*)    Creatinine, Ser 1.15 (*)    Calcium 8.7 (*)    GFR, Estimated 51 (*)    All other components within normal limits  CBC - Abnormal; Notable for the following components:   RBC 3.61 (*)    Hemoglobin 11.1 (*)    HCT 35.7 (*)    All other components within normal limits    EKG None  Radiology DG Hand 2 View Left  Result Date: 02/12/2023 CLINICAL DATA:  Status post fall. EXAM: LEFT HAND - 2 VIEW COMPARISON:  01/06/2016 FINDINGS: Remote healed fracture involving the distal radius. Age indeterminate fracture involving an osteophyte arising off the dorsal base of the first distal phalanx. Scattered degenerative changes are identified involving the PIP and D IP joints as well as the first MCP joint. Soft tissues are unremarkable. IMPRESSION: 1. Age  indeterminate fracture involving an osteophyte arising off the dorsal base of the first distal phalanx. 2. Remote healed fracture involving the distal radius. 3. Scattered degenerative changes. Electronically Signed  By: Signa Kell M.D.   On: 02/12/2023 08:55   DG Knee 2 Views Left  Result Date: 02/12/2023 CLINICAL DATA:  Fall, pain. EXAM: LEFT KNEE - 1-2 VIEW COMPARISON:  None Available. FINDINGS: No acute osseous or joint abnormality. Minimal patellofemoral osteophytosis. IMPRESSION: No acute findings. Electronically Signed   By: Leanna Battles M.D.   On: 02/12/2023 08:50   DG Tibia/Fibula Left  Result Date: 02/12/2023 CLINICAL DATA:  Fall, deformity, pain. EXAM: LEFT TIBIA AND FIBULA - 2 VIEW COMPARISON:  None Available. FINDINGS: Displaced distal tibial shaft fracture with a butterfly fragment. 1.8 cm lateral displacement of the distal fracture fragment. Mildly comminuted, angulated and displaced fracture of the distal fibular shaft. Minimally displaced fracture of the medial malleolus transversely oriented. Osteopenia. Fiberglass splint in place. IMPRESSION: 1. Comminuted fractures of the distal tibial and fibular shafts. 2. Minimally displaced and transversely oriented fracture of the medial malleolus. Electronically Signed   By: Leanna Battles M.D.   On: 02/12/2023 08:50   DG Humerus Left  Result Date: 02/12/2023 CLINICAL DATA:  Fall with bruising to the left shoulder. EXAM: LEFT HUMERUS - 2+ VIEW COMPARISON:  None Available. FINDINGS: Nondisplaced left lateral clavicle fracture, near but not reaching the [facility] joint. Located glenohumeral joint. Intact humerus. IMPRESSION: Nondisplaced lateral clavicle fracture 1 cm from the [facility] joint. Electronically Signed   By: Tiburcio Pea M.D.   On: 02/12/2023 08:49   DG Foot 2 Views Left  Result Date: 02/12/2023 CLINICAL DATA:  Fall, ankle deformity. EXAM: LEFT FOOT - 2 VIEW COMPARISON:  None Available. FINDINGS: Fiberglass splint in place. Possible  nondisplaced transverse fracture at the base of the second metatarsal. Distal tibial and fibular fractures, better seen and described on dedicated views the same day. Osteopenia. Calcaneal spurs. Midfoot degenerative changes. IMPRESSION: 1. Possible nondisplaced transverse fracture at the base of the second metatarsal. 2. Distal tibial and fibular shaft fractures, better seen and described on dedicated views done the same day. Electronically Signed   By: Leanna Battles M.D.   On: 02/12/2023 08:49   DG Shoulder Left  Result Date: 02/12/2023 CLINICAL DATA:  Injury.  Status post fall. EXAM: LEFT SHOULDER - 2+ VIEW COMPARISON:  None Available. FINDINGS: There is an acute, slightly comminuted, nondisplaced fracture deformity involving the distal clavicle. Degenerative type changes are noted at the acromioclavicular joint and glenohumeral joint. No additional fractures. IMPRESSION: Acute, slightly comminuted, nondisplaced fracture deformity of the distal clavicle. Electronically Signed   By: Signa Kell M.D.   On: 02/12/2023 08:48   DG Ankle Complete Left  Result Date: 02/12/2023 CLINICAL DATA:  Fall. EXAM: LEFT ANKLE COMPLETE - 3+ VIEW COMPARISON:  None Available. FINDINGS: Displaced fracture of the distal tibial shaft with a butterfly fragment. Approximately 1.7 cm lateral displacement of the distal fracture fragment. Mildly comminuted, slightly angulated and minimally displaced fracture of the distal fibular shaft. Nondisplaced transverse fracture of the medial malleolus. Ankle mortise is intact. Osteopenia. Fiberglass splint in place. IMPRESSION: 1. Displaced and comminuted distal tibial and fibular shaft fractures. 2. Nondisplaced medial malleolar fracture. Electronically Signed   By: Leanna Battles M.D.   On: 02/12/2023 08:47   CT Head Wo Contrast  Result Date: 02/12/2023 CLINICAL DATA:  Provided history: Head trauma minor. Neck trauma. Additional history obtained from electronic MEDICAL RECORD NUMBER Fall (with head trauma), right forehead pain. EXAM: CT HEAD WITHOUT CONTRAST CT CERVICAL SPINE WITHOUT CONTRAST TECHNIQUE: Multidetector CT imaging of the head and cervical spine was performed following the standard  protocol without intravenous contrast. Multiplanar CT image reconstructions of the cervical spine were also generated. RADIATION DOSE REDUCTION: This exam was performed according to the departmental dose-optimization program which includes automated exposure control, adjustment of the mA and/or kV according to patient size and/or use of iterative reconstruction technique. COMPARISON:  Brain MRI 05/09/2011. Report from head CT 01/21/2008 (images currently unavailable). CTA neck 08/19/2016. FINDINGS: CT HEAD FINDINGS Brain: Extensive chronic encephalomalacia/gliosis again noted within the left cerebral hemisphere, affecting portions of the left frontal, parietal, occipital and temporal lobes. Background mild-to-moderate patchy and ill-defined hypoattenuation within the cerebral white matter, nonspecific but compatible chronic small vessel ischemic disease. Chronic lacunar infarcts within the bilateral thalami, some of which were better appreciated on the prior brain MRI of 05/09/2011. Fairly symmetric chronic CSF density subdural hygromas/hematomas again noted along the bilateral cerebellar hemispheres (measuring up to 13 mm in thickness). There is no acute intracranial hemorrhage. No acute demarcated cortical infarct. No evidence of an intracranial mass. No midline shift. Vascular: No hyperdense vessel.  Atherosclerotic calcifications. Skull: Prior posterior craniectomy on the left (centered at the parietal calvarium). Right parietal burr hole. Sinuses/Orbits: No orbital mass or acute orbital finding. No significant paranasal sinus disease. CT CERVICAL SPINE FINDINGS Alignment: Levocurvature of the cervical spine. No significant spondylolisthesis. Skull base and vertebrae: The basion-dental and  atlanto-dental intervals are maintained.No evidence of acute fracture to the cervical spine. Mild chronic anterior wedge deformity of the C5 vertebral body, unchanged from the prior CTA neck of 08/19/2016. Soft tissues and spinal canal: No prevertebral fluid or swelling. No visible canal hematoma. Disc levels: Cervical spondylosis with multilevel disc space narrowing, disc bulges/central disc protrusions, endplate spurring and uncovertebral hypertrophy. Multilevel spinal canal narrowing. Most notably, disc bulges (with endplate spurring) contribute to apparent moderate spinal canal stenosis at C4-C5 and C5-C6. Multilevel bony neural foraminal narrowing. Upper chest: No consolidation within the imaged lung apices. No visible pneumothorax. IMPRESSION: CT head: 1. No evidence of an acute intracranial abnormality. 2. Extensive chronic encephalomalacia/gliosis again noted within the left cerebral hemisphere (with overlying craniectomy). 3. Mild-to-moderate chronic small vessel ischemic changes within the cerebral white matter. 4. Known chronic bilateral thalamic lacunar infarcts. 5. Known chronic CSF density subdural hygromas/hematomas overlying the bilateral cerebellar hemispheres. CT cervical spine: 1. No evidence of an acute cervical spine fracture. 2. Mild chronic anterior wedge deformity of the C5 vertebral body, unchanged from the prior CTA neck of 08/19/2016. 3. Levocurvature of the cervical spine. 4. Cervical spondylosis as described. Electronically Signed   By: Jackey Loge D.O.   On: 02/12/2023 08:37   CT Cervical Spine Wo Contrast  Result Date: 02/12/2023 CLINICAL DATA:  Provided history: Head trauma minor. Neck trauma. Additional history obtained from electronic MEDICAL RECORD NUMBERFall (with head trauma), right forehead pain. EXAM: CT HEAD WITHOUT CONTRAST CT CERVICAL SPINE WITHOUT CONTRAST TECHNIQUE: Multidetector CT imaging of the head and cervical spine was performed following the standard protocol  without intravenous contrast. Multiplanar CT image reconstructions of the cervical spine were also generated. RADIATION DOSE REDUCTION: This exam was performed according to the departmental dose-optimization program which includes automated exposure control, adjustment of the mA and/or kV according to patient size and/or use of iterative reconstruction technique. COMPARISON:  Brain MRI 05/09/2011. Report from head CT 01/21/2008 (images currently unavailable). CTA neck 08/19/2016. FINDINGS: CT HEAD FINDINGS Brain: Extensive chronic encephalomalacia/gliosis again noted within the left cerebral hemisphere, affecting portions of the left frontal, parietal, occipital and temporal lobes. Background mild-to-moderate patchy and ill-defined hypoattenuation within  the cerebral white matter, nonspecific but compatible chronic small vessel ischemic disease. Chronic lacunar infarcts within the bilateral thalami, some of which were better appreciated on the prior brain MRI of 05/09/2011. Fairly symmetric chronic CSF density subdural hygromas/hematomas again noted along the bilateral cerebellar hemispheres (measuring up to 13 mm in thickness). There is no acute intracranial hemorrhage. No acute demarcated cortical infarct. No evidence of an intracranial mass. No midline shift. Vascular: No hyperdense vessel.  Atherosclerotic calcifications. Skull: Prior posterior craniectomy on the left (centered at the parietal calvarium). Right parietal burr hole. Sinuses/Orbits: No orbital mass or acute orbital finding. No significant paranasal sinus disease. CT CERVICAL SPINE FINDINGS Alignment: Levocurvature of the cervical spine. No significant spondylolisthesis. Skull base and vertebrae: The basion-dental and atlanto-dental intervals are maintained.No evidence of acute fracture to the cervical spine. Mild chronic anterior wedge deformity of the C5 vertebral body, unchanged from the prior CTA neck of 08/19/2016. Soft tissues and spinal  canal: No prevertebral fluid or swelling. No visible canal hematoma. Disc levels: Cervical spondylosis with multilevel disc space narrowing, disc bulges/central disc protrusions, endplate spurring and uncovertebral hypertrophy. Multilevel spinal canal narrowing. Most notably, disc bulges (with endplate spurring) contribute to apparent moderate spinal canal stenosis at C4-C5 and C5-C6. Multilevel bony neural foraminal narrowing. Upper chest: No consolidation within the imaged lung apices. No visible pneumothorax. IMPRESSION: CT head: 1. No evidence of an acute intracranial abnormality. 2. Extensive chronic encephalomalacia/gliosis again noted within the left cerebral hemisphere (with overlying craniectomy). 3. Mild-to-moderate chronic small vessel ischemic changes within the cerebral white matter. 4. Known chronic bilateral thalamic lacunar infarcts. 5. Known chronic CSF density subdural hygromas/hematomas overlying the bilateral cerebellar hemispheres. CT cervical spine: 1. No evidence of an acute cervical spine fracture. 2. Mild chronic anterior wedge deformity of the C5 vertebral body, unchanged from the prior CTA neck of 08/19/2016. 3. Levocurvature of the cervical spine. 4. Cervical spondylosis as described. Electronically Signed   By: Jackey Loge D.O.   On: 02/12/2023 08:37    Procedures Procedures    Medications Ordered in ED Medications  oxyCODONE (Oxy IR/ROXICODONE) immediate release tablet 5 mg (5 mg Oral Given 02/12/23 0855)  potassium chloride (KLOR-CON) packet 40 mEq (40 mEq Oral Given 02/12/23 1034)    ED Course/ Medical Decision Making/ A&P Clinical Course as of 02/12/23 1104  Mon Feb 12, 2023  0844 Independently reviewed this patient's imaging.  CT head stable from prior neuroimaging.  No acute fractures noted on CT C-spine.  Patient has acute fracture of the distal tibia and fibula.  No other acute fractures identified on remainder of x-rays. [JR]  0903 DG Hand 2 View Left  Age  indeterminate fracture involving an osteophyte arising off the dorsal base of the first distal phalanx.   [JR]  0904 DG Tibia/Fibula Left Comminuted fractures of the distal tibial and fibular shafts. 2. Minimally displaced and transversely oriented fracture of the medial malleolus.   [JR]  0904 DG Humerus Left Nondisplaced lateral clavicle fracture 1 cm from the [facility] joint. [JR]  0904 DG Foot 2 Views Left Possible nondisplaced transverse fracture at the base of the second metatarsal.   [JR]    Clinical Course User Index [JR] Rolla Flatten, MD                                 Medical Decision Making Amount and/or Complexity of Data Reviewed Independent Historian: spouse Labs: ordered. Radiology: ordered  and independent interpretation performed. Decision-making details documented in ED Course.  Risk Prescription drug management. Decision regarding hospitalization.   72 year old female presents here after ground-level mechanical fall.  Complaining of left shoulder pain and left ankle pain.  On exam, has bruising and tenderness to the left shoulder and proximal left humerus.  Also has tenderness to the left hand.  Patient has notable left ankle deformity.  However, left foot is neurovascularly intact.  Has tenderness both proximal and distal to the left ankle.  Patient's medications reviewed.  She is not on anticoagulation.  Will plan to obtain x-rays of all areas that are tender, including left shoulder, humerus, hand.  Also left knee, tip/fib, ankle, foot.  Will also obtain CT head and CT C-spine to rule out intracranial hemorrhage or C-spine injury.  Will give IV fentanyl for analgesia.  Traction placed on the patient's foot, which reduced her deformity.  Splint placed for comfort.  Independently reviewed all patient's imaging.  Findings are as above in ED course.  I consulted orthopedics regarding patient's multiple fractures.  I spoke with Charma Igo, PA-C.  Plan from  orthopedics is for surgical intervention with surgery scheduled tomorrow.  The patient would be wheelchair-bound given her left clavicle fracture and is nonweightbearing on her left ankle.  She is a fall risk.  Her husband is not able to care for her at home.  Feel that she would benefit from hospital admission until orthopedic intervention.  I spoke with family medicine, Dr. Chipper Herb, who has accepted her for admission.  Patient's presentation is most consistent with acute complicated illness / injury requiring diagnostic workup.         Final Clinical Impression(s) / ED Diagnoses Final diagnoses:  Closed fracture of distal end of left tibia, unspecified fracture morphology, initial encounter  Closed fracture of distal end of left fibula, unspecified fracture morphology, initial encounter  Closed nondisplaced fracture of acromial end of left clavicle, initial encounter  Fall, initial encounter    Rx / DC Orders ED Discharge Orders     None         Rolla Flatten, MD 02/12/23 1104    Wynetta Fines, MD 02/12/23 [phone]

## 2023-02-12 NOTE — Plan of Care (Signed)
  Problem: Nutrition: Goal: Adequate nutrition will be maintained Outcome: Progressing   Problem: Coping: Goal: Level of anxiety will decrease Outcome: Progressing   Problem: Pain Management: Goal: General experience of comfort will improve Outcome: Not Progressing

## 2023-02-13 DIAGNOSIS — S82302A Unspecified fracture of lower end of left tibia, initial encounter for closed fracture: Secondary | ICD-10-CM | POA: Diagnosis not present

## 2023-02-13 LAB — CBC
HCT: 33.2 % — ABNORMAL LOW (ref 36.0–46.0)
Hemoglobin: 10.9 g/dL — ABNORMAL LOW (ref 12.0–15.0)
MCH: 30.9 pg (ref 26.0–34.0)
MCHC: 32.8 g/dL (ref 30.0–36.0)
MCV: 94.1 fL (ref 80.0–100.0)
Platelets: 162 10*3/uL (ref 150–400)
RBC: 3.53 MIL/uL — ABNORMAL LOW (ref 3.87–5.11)
RDW: 14.6 % (ref 11.5–15.5)
WBC: 7 10*3/uL (ref 4.0–10.5)
nRBC: 0 % (ref 0.0–0.2)

## 2023-02-13 LAB — BASIC METABOLIC PANEL
Anion gap: 10 (ref 5–15)
BUN: 22 mg/dL (ref 8–23)
CO2: 25 mmol/L (ref 22–32)
Calcium: 8.7 mg/dL — ABNORMAL LOW (ref 8.9–10.3)
Chloride: 102 mmol/L (ref 98–111)
Creatinine, Ser: 1.61 mg/dL — ABNORMAL HIGH (ref 0.44–1.00)
GFR, Estimated: 34 mL/min — ABNORMAL LOW (ref >60)
Glucose, Bld: 102 mg/dL — ABNORMAL HIGH (ref 70–99)
Potassium: 3.3 mmol/L — ABNORMAL LOW (ref 3.5–5.1)
Sodium: 137 mmol/L (ref 135–145)

## 2023-02-13 MED ORDER — POTASSIUM CHLORIDE 20 MEQ PO PACK
40.0000 meq | PACK | Freq: Once | ORAL | Status: AC
  Administered 2023-02-13: 40 meq via ORAL
  Filled 2023-02-13: qty 2

## 2023-02-13 MED ORDER — POTASSIUM CHLORIDE 10 MEQ/100ML IV SOLN: 10.0000 meq | INTRAVENOUS | Status: DC

## 2023-02-13 MED ORDER — OXYCODONE HCL 5 MG PO TABS
2.5000 mg | ORAL_TABLET | Freq: Two times a day (BID) | ORAL | Status: DC
  Administered 2023-02-13 – 2023-02-19 (×14): 2.5 mg via ORAL
  Filled 2023-02-13 (×14): qty 1

## 2023-02-13 NOTE — Progress Notes (Signed)
     Daily Progress Note Intern Pager: [phone]  Patient name: Deborah Webb Medical record number: [id number] Date of birth: May 27, 1950 Age: 72 y.o. Gender: female  Primary Care Provider: Corine Shelter, MD Consultants: orthopaedic surgery  Code Status: full code   Pt Overview and Major Events to Date:  12/2- admitted   Assessment and Plan: Deborah Webb is a 72 y.o. female w/ hx of dementia, HTN, HLD, seizures presenting after fall from standing w/ L tib/fib and L clavicular fx.   Assessment & Plan Fall with Ankle fracture and clavicular fracture Suspect deconditioning as etiology for patients fall. Negative CT and neuro exam benign. Ankle S/p reduction and splinting in ED. Ortho consulted, with ORIF planned for 12/10 given edema on initial exam. Clavicular fracture is stable and will be treated nonoperatively with sling.   - ortho following, appreciate recs  - Pain:  - tylenol 1000 q6h sch - oxy scheduled 2.5 mg BID, 5 mg q6h prn - Bowel reg: Miralax daily - PT/OT  Hypokalemia Incidentally noted on admission, repleted yesterday. 3.3 on BMP this AM. Will replete today.  - AM BMP, CBC  Chronic and Stable Problems:  HTN: will continue hydrochlorothiazide and losartan CKD: avoid nephrotoxins  Dementia: continue namenda and aricept Seizures: continue home lamictal Insomnia: discontinue Seroquel  HLD: continue statin, will d/c aspirin as patient was put on for primary prevention   FEN/GI: regular diet  PPx: lovenox  Dispo:likely SNF vs home pending surgery and clinical improvement.  Subjective:  Patient   Objective: Temp:  [97.4 F (36.3 C)-98.4 F (36.9 C)] 98.4 F (36.9 C) (12/03 0621) Pulse Rate:  [56-69] 69 (12/03 0621) Resp:  [17-18] 18 (12/03 0621) BP: (156-176)/(74-93) 156/82 (12/03 0621) SpO2:  [98 %-100 %] 99 % (12/03 1478) Physical Exam: General: NAD, alert and friendly  Cardiovascular: RRR, no m/r/g Respiratory: CTAB, NWOB on  RA Abdomen: soft, nontender, nondistended Extremities: left shoulder in sling, left foot is wrapped. sensation intact on left and right toes   Laboratory: Most recent CBC Lab Results  Component Value Date   WBC 7.0 02/13/2023   HGB 10.9 (L) 02/13/2023   HCT 33.2 (L) 02/13/2023   MCV 94.1 02/13/2023   PLT 162 02/13/2023   Most recent BMP    Latest Ref Rng & Units 02/13/2023    6:40 AM  BMP  Glucose 70 - 99 mg/dL 295   BUN 8 - 23 mg/dL 22   Creatinine 6.21 - 1.00 mg/dL 3.08   Sodium 657 - 846 mmol/L 137   Potassium 3.5 - 5.1 mmol/L 3.3   Chloride 98 - 111 mmol/L 102   CO2 22 - 32 mmol/L 25   Calcium 8.9 - 10.3 mg/dL 8.7     Other pertinent labs: none    Imaging/Diagnostic Tests: No new imaging  Penne Lash, MD 02/13/2023, 9:14 AM  PGY-1, [facility] FPTS Intern pager: [phone], text pages welcome Secure chat group [facility] Teaching Service

## 2023-02-13 NOTE — Assessment & Plan Note (Deleted)
Her fracture with fall from standing height is diagnostic of osteoporosis.  It appears that patient was previously on high-dose vitamin D, but is not currently taking.  Will recheck vitamin D level now.  Given history of CKD, could consider starting Prolia as bisphosphonates are renally limited. - f/u Vit D

## 2023-02-13 NOTE — Evaluation (Signed)
Occupational Therapy Evaluation Patient Details Name: Deborah Webb MRN: [id number] DOB: 04-06-1950 Today's Date: 02/13/2023   History of Present Illness Pt is a 72 y/o F presenting with L clavicle and L ankle fracture after a fall. PMH includes TBI, mild dementia, HTN, HLD, and seizures.   Clinical Impression   PTA patient independent with ADLs, mobility and light iADLs.  Admitted for above and presents with problem list below.  She was educated on weightbearing precautions, safety, and recommendations.  Pt reports possible surgery to L LE tomorrow? Today, requires min to max assist for ADLS, mod assist for lateral scoot transfers at EOB and declines OOB to recliner. She requires cueing to avoid WB through L UE, demosntrates decreased sequencing and problem solving when attempting to mobilize due to decreased functional use of L side.  Per ortho pt is now in sling for comfort and no ROM restrictions to L UE.  Based on performance today, believe pt will best benefit from continued OT services acutely and after dc at an inpatient setting with >3hrs/day to optimize independence, safety and return to PLOF with ADLs and mobility.     If plan is discharge home, recommend the following: Two people to help with walking and/or transfers;A lot of help with bathing/dressing/bathroom;Assist for transportation;Direct supervision/assist for financial management;Direct supervision/assist for medications management;Assistance with cooking/housework    Functional Status Assessment  Patient has had a recent decline in their functional status and demonstrates the ability to make significant improvements in function in a reasonable and predictable amount of time.  Equipment Recommendations  Other (comment) (defer)    Recommendations for Other Services Rehab consult     Precautions / Restrictions Precautions Precautions: Fall Precaution Comments: per Montez Morita 12/3 no shoulder ROM restrictions, sling for  comfort Required Braces or Orthoses: Sling;Splint/Cast Splint/Cast: LLE Restrictions Weight Bearing Restrictions: Yes LUE Weight Bearing: Non weight bearing LLE Weight Bearing: Non weight bearing Other Position/Activity Restrictions: L UE in sling for comfort      Mobility Bed Mobility Overal bed mobility: Needs Assistance Bed Mobility: Supine to Sit, Sit to Supine     Supine to sit: Mod assist, HOB elevated Sit to supine: Mod assist   General bed mobility comments: pt required mod multimodal cues for sequencing and assist for trunk control and to manage LLE    Transfers Overall transfer level: Needs assistance   Transfers: Bed to chair/wheelchair/BSC            Lateral/Scoot Transfers: Mod assist General transfer comment: cueing for technique with increased time and mod assist to simulate scooting towards HOB.  Pt declined OOB to recliner (even after drop arm recliner was found).      Balance Overall balance assessment: Needs assistance Sitting-balance support: Single extremity supported, Feet supported Sitting balance-Leahy Scale: Fair Sitting balance - Comments: min guard to close supervision, cueing to avoid pushing through L UE even with sling on                                   ADL either performed or assessed with clinical judgement   ADL Overall ADL's : Needs assistance/impaired     Grooming: Minimal assistance;Sitting           Upper Body Dressing : Moderate assistance;Sitting   Lower Body Dressing: Maximal assistance;Sitting/lateral leans;Bed level     Toilet Transfer Details (indicate cue type and reason): simulated lateral scoots to [facility] with mod  assist, but pt declined OOB to recliner         Functional mobility during ADLs: Moderate assistance;Cueing for safety;Cueing for sequencing       Vision   Vision Assessment?: No apparent visual deficits     Perception         Praxis         Pertinent Vitals/Pain Pain  Assessment Pain Assessment: Faces Faces Pain Scale: Hurts whole lot Pain Location: LLE with movement Pain Descriptors / Indicators: Discomfort, Grimacing, Moaning Pain Intervention(s): Limited activity within patient's tolerance, Monitored during session, Repositioned     Extremity/Trunk Assessment Upper Extremity Assessment Upper Extremity Assessment: Right hand dominant;LUE deficits/detail LUE Deficits / Details: L UE in sling, repositioned upon entry; requires cueing to avoid weightbearing to UE when mobilizing.  Hand/wrist/elbow WFL. Shoulder not tested but after eval Montez Morita 12/3 reports no shoulder ROM restriction and sling for comfort only. LUE Sensation: decreased light touch LUE Coordination: decreased fine motor;decreased gross motor   Lower Extremity Assessment Lower Extremity Assessment: Defer to PT evaluation LLE: Unable to fully assess due to pain;Unable to fully assess due to immobilization   Cervical / Trunk Assessment Cervical / Trunk Assessment: Normal   Communication Communication Communication: No apparent difficulties   Cognition Arousal: Alert Behavior During Therapy: WFL for tasks assessed/performed Overall Cognitive Status: History of cognitive impairments - at baseline Area of Impairment: Problem solving, Following commands, Safety/judgement, Awareness, Attention, Memory                   Current Attention Level: Sustained Memory: Decreased short-term memory Following Commands: Follows one step commands consistently, Follows one step commands with increased time, Follows multi-step commands inconsistently Safety/Judgement: Decreased awareness of deficits, Decreased awareness of safety Awareness: Emergent Problem Solving: Slow processing, Difficulty sequencing, Requires verbal cues General Comments: patient with hx of dementia, demonstrates decreased sequencing and problem sovling, adherance to L UE NWB with mobilization.     General Comments   splint intact to L LE, pt reports plan for surgery tomorrow?    Exercises     Shoulder Instructions      Home Living Family/patient expects to be discharged to:: Private residence Living Arrangements: Spouse/significant other Available Help at Discharge: Family;Available 24 hours/day Type of Home: House Home Access: Stairs to enter [employer] of Steps: 4 Entrance Stairs-Rails: None Home Layout: One level     Bathroom Shower/Tub: Tub/shower unit;Other (comment) (grab bar)   Bathroom Toilet: Standard Bathroom Accessibility: Yes How Accessible: Accessible via wheelchair Home Equipment: Crutches;Grab bars - tub/shower;Rolling Walker (2 wheels)   Additional Comments: pt did not use AD prior to admission; husband unable to provide physical assist      Prior Functioning/Environment Prior Level of Function : Independent/Modified Independent               ADLs Comments: not driving, managing iADLs/ADLs; spouse drives        OT Problem List: Decreased strength;Decreased activity tolerance;Decreased range of motion;Impaired balance (sitting and/or standing);Decreased coordination;Decreased cognition;Decreased safety awareness;Decreased knowledge of use of DME or AE;Decreased knowledge of precautions;Pain;Impaired UE functional use      OT Treatment/Interventions: Self-care/ADL training;Therapeutic exercise;DME and/or AE instruction;Therapeutic activities;Cognitive remediation/compensation;Balance training;Patient/family education    OT Goals(Current goals can be found in the care plan section) Acute Rehab OT Goals Patient Stated Goal: get better OT Goal Formulation: With patient Time For Goal Achievement: 02/27/23 Potential to Achieve Goals: Good  OT Frequency: Min 1X/week    Co-evaluation  AM-PAC OT "6 Clicks" Daily Activity     Outcome Measure Help from another person eating meals?: A Little Help from another person taking care of  personal grooming?: A Little Help from another person toileting, which includes using toliet, bedpan, or urinal?: Total Help from another person bathing (including washing, rinsing, drying)?: A Lot Help from another person to put on and taking off regular upper body clothing?: A Little Help from another person to put on and taking off regular lower body clothing?: A Lot 6 Click Score: 14   End of Session Equipment Utilized During Treatment: Other (comment) (sling) Nurse Communication: Mobility status  Activity Tolerance: Patient tolerated treatment well Patient left: in bed;with call bell/phone within reach;with bed alarm set  OT Visit Diagnosis: Other abnormalities of gait and mobility (R26.89);Muscle weakness (generalized) (M62.81);Pain;History of falling (Z91.81) Pain - Right/Left: Left Pain - part of body: Arm;Leg                Time: 2841-3244 OT Time Calculation (min): 26 min Charges:  OT General Charges $OT Visit: 1 Visit OT Evaluation $OT Eval Moderate Complexity: 1 Mod OT Treatments $Self Care/Home Management : 8-22 mins  Barry Brunner, OT Acute Rehabilitation Services Office [phone]   Chancy Milroy 02/13/2023, 1:16 PM

## 2023-02-13 NOTE — Assessment & Plan Note (Addendum)
Suspect deconditioning as etiology for patients fall. Negative CT and neuro exam benign. Ankle S/p reduction and splinting in ED. Ortho consulted, with ORIF planned for 12/10 given edema on initial exam. Clavicular fracture is stable and will be treated nonoperatively with sling.   - ortho following, appreciate recs  - Pain:  - tylenol 1000 q6h sch - oxy scheduled 2.5 mg BID, 5 mg q6h prn - Bowel reg: Miralax daily - PT/OT

## 2023-02-13 NOTE — TOC CAGE-AID Note (Signed)
Transition of Care [facility]) - CAGE-AID Screening   Patient Details  Name: Deborah Webb MRN: [id number] Date of Birth: [birth year]  Transition of [facility]) CM/SW Contact:    Janora Norlander, RN Phone Number: [phone] 02/13/2023, 4:15 PM   Clinical Narrative: Pt here after sustaining a L ankle and L clavicle fx.  Pt does have a hx of dementia but denies drug or alcohol use.  Screening complete.    CAGE-AID Screening:    Have You Ever Felt You Ought to Cut Down on Your Drinking or Drug Use?: No Have People Annoyed You By Critizing Your Drinking Or Drug Use?: No Have You Felt Bad Or Guilty About Your Drinking Or Drug Use?: No Have You Ever Had a Drink or Used Drugs First Thing In The Morning to Steady Your Nerves or to Get Rid of a Hangover?: No CAGE-AID Score: 0  Substance Abuse Education Offered: No

## 2023-02-13 NOTE — Plan of Care (Signed)
  Problem: Coping: Goal: Level of anxiety will decrease Outcome: Not Progressing   Problem: Pain Management: Goal: General experience of comfort will improve Outcome: Not Progressing   Problem: Safety: Goal: Ability to remain free from injury will improve Outcome: Not Progressing

## 2023-02-13 NOTE — Assessment & Plan Note (Deleted)
Placed in sling in ED. Pain control and PT/OT as above.

## 2023-02-13 NOTE — Evaluation (Signed)
Physical Therapy Evaluation Patient Details Name: Deborah Webb MRN: [id number] DOB: October 01, 1950 Today's Date: 02/13/2023  History of Present Illness  Pt is a 72 y/o F presenting with L clavicle and L ankle fracture after a fall. PMH includes TBI, mild dementia, HTN, HLD, and seizures.  Clinical Impression  Received pt semi-reclined in bed and eager to participate despite pain. Pt educated on NWB precautions and required cues for adherence throughout evaluation. Pt required mod A for bed mobility using bed features with assist for trunk control and LLE management. While therapist adjusted sling pt reported living with husband who is unable to provide any physical assist in 1 level home with 4 STE and no rails. Pt reports having RW but did not use AD prior to admission. Unable to progress OOB due to pt not having drop arm recliner and being unable to stand due to pain and balance deficits from NWB precautions. Recommend intensive therapy >3hrs/day to maximize independence with mobility and work on strength/balance deficits.       If plan is discharge home, recommend the following: Two people to help with walking and/or transfers;Two people to help with bathing/dressing/bathroom;Direct supervision/assist for financial management;Assist for transportation;Help with stairs or ramp for entrance;Supervision due to cognitive status   Can travel by private vehicle        Equipment Recommendations Other (comment) (TBD)  Recommendations for Other Services  Rehab consult    Functional Status Assessment Patient has had a recent decline in their functional status and demonstrates the ability to make significant improvements in function in a reasonable and predictable amount of time.     Precautions / Restrictions Precautions Precautions: Fall Required Braces or Orthoses: Sling;Splint/Cast Splint/Cast: LLE Restrictions Weight Bearing Restrictions: Yes LUE Weight Bearing: Non weight bearing  (sling) LLE Weight Bearing: Non weight bearing      Mobility  Bed Mobility Overal bed mobility: Needs Assistance Bed Mobility: Rolling, Supine to Sit, Sit to Supine Rolling: Mod assist   Supine to sit: Mod assist, HOB elevated, Used rails Sit to supine: Mod assist   General bed mobility comments: pt required mod multimodal cues for sequencing and assist for trunk control and to manage LLE Patient Response: Cooperative  Transfers                   General transfer comment: did not transfer OOB due to not having proper equipment    Ambulation/Gait               General Gait Details: unable  Stairs            Wheelchair Mobility     Tilt Bed Tilt Bed Patient Response: Cooperative  Modified Rankin (Stroke Patients Only)       Balance Overall balance assessment: Needs assistance Sitting-balance support: Single extremity supported, Feet supported Sitting balance-Leahy Scale: Fair Sitting balance - Comments: pt able to maintain static sitting balance with supervision - cues to avoid pushing through LUE and assist to adjust sling.       Standing balance comment: unable due to pain and WB precautions                             Pertinent Vitals/Pain Pain Assessment Pain Assessment: 0-10 Pain Score: 9  Pain Location: LLE Pain Descriptors / Indicators: Discomfort, Grimacing, Moaning Pain Intervention(s): Limited activity within patient's tolerance, Monitored during session, Repositioned, Patient requesting pain meds-RN notified    Home Living  Family/patient expects to be discharged to:: Private residence Living Arrangements: Spouse/significant other Available Help at Discharge: Family;Available 24 hours/day Type of Home: House Home Access: Stairs to enter Entrance Stairs-Rails: None Entrance Stairs-Number of Steps: 4   Home Layout: One level Home Equipment: Crutches;Grab bars - tub/shower;Rolling Walker (2 wheels) Additional  Comments: pt did not use AD prior to admission; husband unable to provide physical assist    Prior Function Prior Level of Function : Independent/Modified Independent                     Extremity/Trunk Assessment   Upper Extremity Assessment Upper Extremity Assessment: Defer to OT evaluation    Lower Extremity Assessment Lower Extremity Assessment: Generalized weakness;LLE deficits/detail LLE: Unable to fully assess due to pain;Unable to fully assess due to immobilization    Cervical / Trunk Assessment Cervical / Trunk Assessment: Normal  Communication   Communication Communication: No apparent difficulties  Cognition Arousal: Alert Behavior During Therapy: WFL for tasks assessed/performed Overall Cognitive Status: History of cognitive impairments - at baseline                                 General Comments: dementia at baseline, but overall pleasant, cooperative, and motivated        General Comments General comments (skin integrity, edema, etc.): pleasant and cooperative    Exercises     Assessment/Plan    PT Assessment Patient needs continued PT services  PT Problem List Decreased strength;Decreased range of motion;Decreased activity tolerance;Decreased balance;Decreased mobility;Decreased safety awareness;Decreased knowledge of precautions;Decreased cognition;Decreased coordination;Cardiopulmonary status limiting activity;Pain       PT Treatment Interventions DME instruction;Gait training;Functional mobility training;Therapeutic activities;Therapeutic exercise;Patient/family education;Cognitive remediation;Neuromuscular re-education;Balance training    PT Goals (Current goals can be found in the Care Plan section)  Acute Rehab PT Goals Patient Stated Goal: to get stronger PT Goal Formulation: With patient Time For Goal Achievement: 02/27/23 Potential to Achieve Goals: Good    Frequency Min 1X/week     Co-evaluation                AM-PAC PT "6 Clicks" Mobility  Outcome Measure Help needed turning from your back to your side while in a flat bed without using bedrails?: A Lot Help needed moving from lying on your back to sitting on the side of a flat bed without using bedrails?: A Lot Help needed moving to and from a bed to a chair (including a wheelchair)?: A Lot Help needed standing up from a chair using your arms (e.g., wheelchair or bedside chair)?: Total Help needed to walk in hospital room?: Total Help needed climbing 3-5 steps with a railing? : Total 6 Click Score: 9    End of Session   Activity Tolerance: Patient limited by pain Patient left: in bed;with call bell/phone within reach;with bed alarm set Nurse Communication: Mobility status;Other (comment) (need for drop arm recliner) PT Visit Diagnosis: Unsteadiness on feet (R26.81);Other abnormalities of gait and mobility (R26.89);Muscle weakness (generalized) (M62.81);Pain Pain - Right/Left: Left Pain - part of body: Leg;Shoulder    Time: 1191-4782 PT Time Calculation (min) (ACUTE ONLY): 19 min   Charges:   PT Evaluation $PT Eval Moderate Complexity: 1 Mod   PT General Charges $$ ACUTE PT VISIT: 1 Visit         Blima Rich PT, DPT [facility] [employer]age Zaunegger 02/13/2023, 9:43 AM

## 2023-02-13 NOTE — Assessment & Plan Note (Deleted)
As were etiology for patient's fall, suspect deconditioning.  Neuroexam negative and CT head negative, making TIA and stroke less likely.  Patient HR also in 50s on admission, bradycardia could be contributing to her fall risk.  Appears that patient is not taking her home metoprolol, this was not continued.  BP elevated, will continue other home BP meds (appears is not taking her amlodipine either. - EKG - PT/OT - Fall precautions

## 2023-02-13 NOTE — Assessment & Plan Note (Addendum)
Incidentally noted on admission, repleted yesterday. 3.3 on BMP this AM. Will replete today.  - AM BMP, CBC

## 2023-02-13 NOTE — Progress Notes (Signed)
Inpatient Rehab Admissions Coordinator:   Per PT recommendations pt was screened for CIR by Estill Dooms, PT, DPT.  Note plans for operative intervention today, pending swelling in LLE.  Depending on post op course may not have the medical necessity to support insurance approval with [facility].  We will rescreen after post-op therapy evaluations are completed.   Estill Dooms, PT, DPT Admissions Coordinator [phone] 02/13/23  11:04 AM

## 2023-02-14 DIAGNOSIS — S82892A Other fracture of left lower leg, initial encounter for closed fracture: Secondary | ICD-10-CM

## 2023-02-14 DIAGNOSIS — S42035A Nondisplaced fracture of lateral end of left clavicle, initial encounter for closed fracture: Secondary | ICD-10-CM

## 2023-02-14 DIAGNOSIS — W19XXXA Unspecified fall, initial encounter: Secondary | ICD-10-CM | POA: Diagnosis not present

## 2023-02-14 DIAGNOSIS — E876 Hypokalemia: Secondary | ICD-10-CM

## 2023-02-14 DIAGNOSIS — I10 Essential (primary) hypertension: Secondary | ICD-10-CM

## 2023-02-14 LAB — IRON AND TIBC
Iron: 32 ug/dL (ref 28–170)
Saturation Ratios: 10 % — ABNORMAL LOW (ref 10.4–31.8)
TIBC: 322 ug/dL (ref 250–450)
UIBC: 290 ug/dL

## 2023-02-14 LAB — BASIC METABOLIC PANEL
Anion gap: 7 (ref 5–15)
BUN: 21 mg/dL (ref 8–23)
CO2: 28 mmol/L (ref 22–32)
Calcium: 9 mg/dL (ref 8.9–10.3)
Chloride: 102 mmol/L (ref 98–111)
Creatinine, Ser: 1.31 mg/dL — ABNORMAL HIGH (ref 0.44–1.00)
GFR, Estimated: 43 mL/min — ABNORMAL LOW (ref >60)
Glucose, Bld: 88 mg/dL (ref 70–99)
Potassium: 3.5 mmol/L (ref 3.5–5.1)
Sodium: 137 mmol/L (ref 135–145)

## 2023-02-14 LAB — FERRITIN: Ferritin: 35 ng/mL (ref 11–307)

## 2023-02-14 MED ORDER — AMLODIPINE BESYLATE 5 MG PO TABS
5.0000 mg | ORAL_TABLET | Freq: Every day | ORAL | Status: DC
  Administered 2023-02-14 – 2023-02-22 (×9): 5 mg via ORAL
  Filled 2023-02-14 (×9): qty 1

## 2023-02-14 MED ORDER — FERROUS SULFATE 325 (65 FE) MG PO TABS
325.0000 mg | ORAL_TABLET | ORAL | Status: DC
  Administered 2023-02-14 – 2023-02-22 (×5): 325 mg via ORAL
  Filled 2023-02-14 (×5): qty 1

## 2023-02-14 NOTE — Progress Notes (Addendum)
I agree with the findings above by Montez Morita, PA-C, and I have directed the plan for treatment as noted to reschedule surgery.  Budd Palmer, MD 4:47 PM       Orthopaedic trauma service  Soft tissue left ankle was reassessed.  As expected swelling has not resolved enough to allow for safe surgical intervention for tomorrow.  Patient was already placed on the schedule for next Tuesday.  Will reevaluate her skin on Monday if she is still in the hospital.  She could potentially discharge for the next few days and then she does so would then have her follow-up in the office on Monday morning for skin check  Continue with aggressive ice and elevation Think " toes above nose" when elevating Continue to move toes to help with swelling resolution as well  Continue nonweightbearing left leg  Mearl Latin, PA-C [phone] (C) 02/14/2023, 10:36 AM  [facility] [address] [place] [address] [phone] Collier Bullock (F)      Patient ID: Deborah Webb, female   DOB: 14-Sep-1950, 72 y.o.   MRN: [id number]

## 2023-02-14 NOTE — Plan of Care (Signed)
  Problem: Clinical Measurements: Goal: Ability to maintain clinical measurements within normal limits will improve Outcome: Progressing Goal: Will remain free from infection Outcome: Progressing   Problem: Activity: Goal: Risk for activity intolerance will decrease Outcome: Progressing   Problem: Nutrition: Goal: Adequate nutrition will be maintained Outcome: Progressing   Problem: Elimination: Goal: Will not experience complications related to bowel motility Outcome: Progressing Goal: Will not experience complications related to urinary retention Outcome: Progressing   Problem: Pain Management: Goal: General experience of comfort will improve Outcome: Progressing   Problem: Safety: Goal: Ability to remain free from injury will improve Outcome: Progressing   Problem: Skin Integrity: Goal: Risk for impaired skin integrity will decrease Outcome: Progressing

## 2023-02-14 NOTE — Progress Notes (Signed)
Physical Therapy Treatment Patient Details Name: Deborah Webb MRN: [id number] DOB: 07/01/1950 Today's Date: 02/14/2023   History of Present Illness Pt is a 72 y/o F presenting with L clavicle and L ankle fracture after a fall. PMH includes TBI, mild dementia, HTN, HLD, and seizures.    PT Comments  Pt in bed upon arrival with husband present and agreeable to PT session. Worked on transfers and LE strength in today's session. Pt continues to need modA for bed mobility with frequent cues to adhere to WB precautions. Pt was able to perform two lateral scoots with and without a slide board with MaxA. Pt needed multiple cues for sequencing and adherence to precautions as pt tends push or grab with L UE. Current d/c plan remains appropriate to help pt gain independence with mobility. Pt is progressing towards goals. Acute PT to follow.      If plan is discharge home, recommend the following: Two people to help with walking and/or transfers;Two people to help with bathing/dressing/bathroom;Direct supervision/assist for financial management;Assist for transportation;Help with stairs or ramp for entrance;Supervision due to cognitive status   Can travel by private vehicle      No  Equipment Recommendations  Other (comment) (TBD)    Recommendations for Other Services Rehab consult     Precautions / Restrictions Precautions Precautions: Fall Precaution Comments: per Montez Morita 12/3 no shoulder ROM restrictions, sling for comfort Required Braces or Orthoses: Sling;Splint/Cast Splint/Cast: LLE Restrictions Weight Bearing Restrictions: Yes LUE Weight Bearing: Non weight bearing LLE Weight Bearing: Non weight bearing Other Position/Activity Restrictions: L UE in sling for comfort     Mobility  Bed Mobility Overal bed mobility: Needs Assistance Bed Mobility: Supine to Sit, Sit to Supine     Supine to sit: Mod assist, HOB elevated Sit to supine: Mod assist   General bed mobility  comments: ModA for L LE management for sup/sit and to shift hips forwards toward EOB. Cues for sequencing and adherance with L UE NWB. ModA for return to supine for B LE management.    Transfers Overall transfer level: Needs assistance Equipment used: Sliding board Transfers: Bed to chair/wheelchair/BSC     Lateral/Scoot Transfers: Max assist, With slide board General transfer comment: performed 1 lateral scoot w/ no slideboard and 1 lateral scoot w/ slideboard. MaxA to assist at hips. Multiple and frequent cues to not push with L UE, cues to use head/hip relationship with little follow through. Effortful with increased time          Balance Overall balance assessment: Needs assistance Sitting-balance support: Single extremity supported, Feet supported Sitting balance-Leahy Scale: Fair Sitting balance - Comments: Multiple cues to avoid pushing through L UE even with sling on         Cognition Arousal: Alert Behavior During Therapy: WFL for tasks assessed/performed Overall Cognitive Status: History of cognitive impairments - at baseline Area of Impairment: Problem solving, Following commands, Safety/judgement, Awareness, Attention, Memory    Current Attention Level: Sustained Memory: Decreased short-term memory, Decreased recall of precautions Following Commands: Follows one step commands consistently, Follows one step commands with increased time, Follows multi-step commands inconsistently Safety/Judgement: Decreased awareness of deficits, Decreased awareness of safety Awareness: Emergent Problem Solving: Slow processing, Difficulty sequencing, Requires verbal cues General Comments: patient with hx of dementia, has difficulty remembering NWB precautions, trouble with sequencing for movement        Exercises General Exercises - Lower Extremity Long Arc Quad: AROM, Left, 5 reps, Seated    General Comments  General comments (skin integrity, edema, etc.): splint intact on L  LE      Pertinent Vitals/Pain Pain Assessment Pain Assessment: Faces Faces Pain Scale: Hurts even more Pain Location: LLE with movement Pain Descriptors / Indicators: Discomfort, Grimacing, Moaning Pain Intervention(s): Limited activity within patient's tolerance, Monitored during session, Repositioned           PT Goals (current goals can now be found in the care plan section) Acute Rehab PT Goals PT Goal Formulation: With patient Time For Goal Achievement: 02/27/23 Potential to Achieve Goals: Good Progress towards PT goals: Progressing toward goals    Frequency    Min 1X/week       AM-PAC PT "6 Clicks" Mobility   Outcome Measure  Help needed turning from your back to your side while in a flat bed without using bedrails?: A Lot Help needed moving from lying on your back to sitting on the side of a flat bed without using bedrails?: A Lot Help needed moving to and from a bed to a chair (including a wheelchair)?: A Lot Help needed standing up from a chair using your arms (e.g., wheelchair or bedside chair)?: Total Help needed to walk in hospital room?: Total Help needed climbing 3-5 steps with a railing? : Total 6 Click Score: 9    End of Session Equipment Utilized During Treatment: Gait belt Activity Tolerance: Patient tolerated treatment well Patient left: in bed;with call bell/phone within reach;with bed alarm set;with family/visitor present Nurse Communication: Mobility status PT Visit Diagnosis: Unsteadiness on feet (R26.81);Other abnormalities of gait and mobility (R26.89);Muscle weakness (generalized) (M62.81)     Time: 7062-3762 PT Time Calculation (min) (ACUTE ONLY): 43 min  Charges:    $Therapeutic Exercise: 8-22 mins $Therapeutic Activity: 8-22 mins PT General Charges $$ ACUTE PT VISIT: 1 Visit                     Hilton Cork, PT, DPT Secure Chat Preferred  Rehab Office [phone]   Arturo Morton Brion Aliment 02/14/2023, 4:06 PM

## 2023-02-14 NOTE — Progress Notes (Addendum)
     Daily Progress Note Intern Pager: [phone]  Patient name: Deborah Webb Medical record number: [id number] Date of birth: 06-Sep-1950 Age: 72 y.o. Gender: female  Primary Care Provider: Corine Shelter, MD Consultants: orthopaedic surgery  Code Status: full code   Pt Overview and Major Events to Date:  12/2- admitted   Assessment and Plan: Deborah Webb is a 72 y.o. female w/ hx of dementia, HTN, HLD, seizures presenting after fall from standing w/ L tib/fib and L clavicular fx. Patient is being followed by orthopaedics who recommend allowing swelling to reduce prior to surgical fixation. Patient is doing well this AM. No complaints at this time.   Assessment & Plan Fall with Ankle fracture and clavicular fracture Patient is being followed by orthopedics and recommend ORIF for ankle fracture pending improvement of swelling.  Clavicular fracture is stable and was treated nonoperatively with sling. Calculated Chales Abrahams Perioperative Risk for MI: 0.6%.  - ortho following, appreciate recs  - Pain:  - tylenol 1000 q6h sch - oxy scheduled 2.5 mg BID, 5 mg q6h prn - Bowel reg: Miralax daily - PT/OT  Essential hypertension Patient continues to have high blood pressures, with most recent 190/80.  Adjusted patient's pain regimen yesterday, presume this may be causing increase in patient's blood pressure.  Have continued hydrochlorothiazide and losartan. Orthostatics (sitting and laying) negative.  - will restart amlodipine 5 mg given Cr is likely at baseline.   Anemia Consistent with iron def. Anemia.  - Start daily iron     Chronic and Stable Problems:  CKD: avoid nephrotoxins  Dementia: continue namenda and aricept Seizures: continue home lamictal Insomnia: continue to hold home Seroquel  HLD: continue statin, continue to hold aspirin   FEN/GI: regular diet  PPx: lovenox  Dispo:likely SNF vs home pending surgery and clinical improvement.  Subjective:  Patient  appears well this AM. Complaining her leg is itching.   Objective: Temp:  [98 F (36.7 C)-98.4 F (36.9 C)] 98.3 F (36.8 C) (12/03 2023) Pulse Rate:  [67-77] 68 (12/04 0546) Resp:  [15-18] 15 (12/04 0546) BP: (150-168)/(73-90) 150/80 (12/04 0546) SpO2:  [97 %-100 %] 100 % (12/04 0546) Physical Exam: General: NAD, alert and friendly  Cardiovascular: RRR, no m/r/g Respiratory: CTAB, NWOB on RA Abdomen: soft, nontender, nondistended Extremities: left shoulder in sling, left foot is wrapped. sensation intact on left and right toes. Able to move legs to sitting position with assistance   Laboratory: Most recent CBC Lab Results  Component Value Date   WBC 7.0 02/13/2023   HGB 10.9 (L) 02/13/2023   HCT 33.2 (L) 02/13/2023   MCV 94.1 02/13/2023   PLT 162 02/13/2023   Most recent BMP    Latest Ref Rng & Units 02/14/2023    6:05 AM  BMP  Glucose 70 - 99 mg/dL 88   BUN 8 - 23 mg/dL 21   Creatinine 2.84 - 1.00 mg/dL 1.32   Sodium 440 - 102 mmol/L 137   Potassium 3.5 - 5.1 mmol/L 3.5   Chloride 98 - 111 mmol/L 102   CO2 22 - 32 mmol/L 28   Calcium 8.9 - 10.3 mg/dL 9.0     Other pertinent labs: none    Imaging/Diagnostic Tests: No new imaging  Penne Lash, MD 02/14/2023, 7:28 AM  PGY-1, [facility] FPTS Intern pager: [phone], text pages welcome Secure chat group [facility] Teaching Service

## 2023-02-14 NOTE — Care Management Important Message (Signed)
Important Message  Patient Details  Name: Deborah Webb MRN: [id number] Date of Birth: Apr 15, 1950   Important Message Given:  Yes - Medicare IM     Sherilyn Banker 02/14/2023, 2:15 PM

## 2023-02-14 NOTE — Assessment & Plan Note (Addendum)
Patient continues to have high blood pressures, with most recent 190/80.  Adjusted patient's pain regimen yesterday, presume this may be causing increase in patient's blood pressure.  Have continued hydrochlorothiazide and losartan. Orthostatics (sitting and laying) negative.  - will restart amlodipine 5 mg given Cr is likely at baseline.

## 2023-02-14 NOTE — Hospital Course (Addendum)
Deborah Webb is a 72 y.o.female with a history of dementia, hypertension, hyperlipidemia, seizures who was admitted to the family medicine teaching Service at [facility] for fall. Her hospital course is detailed below:  Fall resulting in fracture of left hip/fib and left clavicular fracture Patient presented to the emergency room after she fell at the sink in her bathroom from reaching over to get something and subsequently losing balance.  Patient denies losing consciousness at that time.  She had imaging in the ED that showed displaced fracture of the left tib/fib, nondisplaced medial malleolus fracture, nondisplaced left distal clavicular fracture, possible nondisplaced second metatarsal left foot fracture.  At that time, CT head and C-spine were negative.  Orthopedics were consulted and recommended no surgical intervention for clavicular fracture and ORIF of tib/fib fracture. Given patient had significant swelling, orthopedic surgery recommended waiting for swelling to decrease.  Patient had surgery on ***. Patient had pain control while inpatient and was discharged with the pain regimen of scheduled tylenol and oxycodone 2.5 mg q6h. Patient did well with this.   Given nature of patient's fall, patient was started on calcium and vitamin D.  Anemia Iron studies consistent with iron deficiency anemia.  Patient was started on daily iron supplementation.  Patient will likely need outpatient and repeat CBC in 6 to 8 weeks. Other chronic conditions were medically managed with home medications and formulary alternatives as necessary (hypertension, hyperlipidemia, dementia, seizures)  PCP Follow-up Recommendations: Discontinued aspirin as it seems this was added for primary prevention, please re-evaluate need for aspirin. Given history of CKD, could consider starting Prolia as bisphosphonates are renally limited for her hip fracture. Repeat CBC in 6-8 weeks.  Consider IV iron infusion outpatient. Per  Ortho, 10-14 day f/u outpatient, should be weightbearing in 6 weeks.

## 2023-02-14 NOTE — Assessment & Plan Note (Addendum)
Patient is being followed by orthopedics and recommend ORIF for ankle fracture pending improvement of swelling.  Clavicular fracture is stable and was treated nonoperatively with sling. Calculated Chales Abrahams Perioperative Risk for MI: 0.6%.  - ortho following, appreciate recs  - Pain:  - tylenol 1000 q6h sch - oxy scheduled 2.5 mg BID, 5 mg q6h prn - Bowel reg: Miralax daily - PT/OT

## 2023-02-15 DIAGNOSIS — S82899S Other fracture of unspecified lower leg, sequela: Secondary | ICD-10-CM

## 2023-02-15 NOTE — Assessment & Plan Note (Deleted)
Placed in sling in ED. Pain control and PT/OT as above.

## 2023-02-15 NOTE — Assessment & Plan Note (Addendum)
Patient is being followed by orthopedics and recommend ORIF for ankle fracture pending improvement of swelling.  Clavicular fracture is stable and was treated nonoperatively with sling. Calculated Chales Abrahams Perioperative Risk for MI: 0.6%.  - ortho following, appreciate recs  - Pain:  - tylenol 1000 q6h sch - oxy scheduled 2.5 mg BID, 5 mg q6h prn - Bowel reg: Miralax daily - PT/OT - Continue Vit D and calcium, consider starting bisphosphonate's on d/c

## 2023-02-15 NOTE — Progress Notes (Signed)
Physical Therapy Treatment Patient Details Name: Deborah Webb MRN: [id number] DOB: September 10, 1950 Today's Date: 02/15/2023   History of Present Illness Pt is a 72 y/o F presenting with L clavicle and L ankle fracture after a fall. PMH includes TBI, mild dementia, HTN, HLD, and seizures.    PT Comments  Received pt semi-reclined in bed and agreeable to PT treatment. Pt performed bed mobility with light mod A for LLE management and trunk control. Pt continues to require max cues to adhere to WB precautions (specifically NWB to LUE, but did improve with sling on). Therapist adjusted sling and pt transferred bed<>recliner to R via lateral scoot with min A today with mod cues for sequencing. Current plan remains appropriate to help pt gain independence with mobility. Acute PT to cont to follow.    If plan is discharge home, recommend the following: Direct supervision/assist for financial management;Assist for transportation;Help with stairs or ramp for entrance;Supervision due to cognitive status;A lot of help with walking and/or transfers;A lot of help with bathing/dressing/bathroom   Can travel by private vehicle        Equipment Recommendations  Other (comment) (TBD in next venue)    Recommendations for Other Services       Precautions / Restrictions Precautions Precautions: Fall Precaution Comments: per Montez Morita 12/3 no shoulder ROM restrictions, sling for comfort Required Braces or Orthoses: Sling;Splint/Cast Splint/Cast: LLE Restrictions Weight Bearing Restrictions: Yes LUE Weight Bearing: Non weight bearing LLE Weight Bearing: Non weight bearing Other Position/Activity Restrictions: L UE in sling for comfort     Mobility  Bed Mobility Overal bed mobility: Needs Assistance Bed Mobility: Supine to Sit Rolling: Mod assist   Supine to sit: Mod assist, HOB elevated, Used rails     General bed mobility comments: light mod A (approaching min A) for LLE management and trunk  control. Max cues to adhere to LUE NWB precautions Patient Response: Cooperative  Transfers Overall transfer level: Needs assistance   Transfers: Bed to chair/wheelchair/BSC            Lateral/Scoot Transfers: Min assist General transfer comment: pt with poor awareness initally asking for the RW to stand. Performed lateral scoot bed<>recliner to R with increased time and cues for sequencing technique. Pt with improved awareness of LUE NWB precautions with sling on. Pt resting RLE on floor during transfer, unable to determine if pt was placing weight through RLE despite stating she was not    Ambulation/Gait               General Gait Details: unable   Stairs             Wheelchair Mobility     Tilt Bed Tilt Bed Patient Response: Cooperative  Modified Rankin (Stroke Patients Only)       Balance Overall balance assessment: Needs assistance Sitting-balance support: Single extremity supported, Feet supported Sitting balance-Leahy Scale: Fair Sitting balance - Comments: able to maintain static stitting balance with supervision. Adherance to LUE NWB precautions improved with sling on.       Standing balance comment: unable due to WB precautions, impaired memory, and impaired sequencing                            Cognition Arousal: Alert Behavior During Therapy: WFL for tasks assessed/performed Overall Cognitive Status: History of cognitive impairments - at baseline Area of Impairment: Problem solving, Following commands, Safety/judgement, Awareness, Attention, Memory  Memory: Decreased short-term memory, Decreased recall of precautions Following Commands: Follows one step commands consistently, Follows one step commands with increased time, Follows multi-step commands inconsistently Safety/Judgement: Decreased awareness of deficits, Decreased awareness of safety   Problem Solving: Slow processing, Difficulty  sequencing, Requires verbal cues General Comments: patient with hx of dementia, has difficulty remembering NWB precautions, trouble with sequencing for movement        Exercises      General Comments        Pertinent Vitals/Pain Pain Assessment Pain Score: 8  Pain Location: LUE Pain Descriptors / Indicators: Discomfort Pain Intervention(s): Limited activity within patient's tolerance, Monitored during session, Repositioned, Other (comment) (pt declining pain medication)    Home Living                          Prior Function            PT Goals (current goals can now be found in the care plan section) Acute Rehab PT Goals Patient Stated Goal: to get stronger PT Goal Formulation: With patient Time For Goal Achievement: 02/27/23 Potential to Achieve Goals: Good Progress towards PT goals: Progressing toward goals    Frequency    Min 1X/week      PT Plan      Co-evaluation              AM-PAC PT "6 Clicks" Mobility   Outcome Measure  Help needed turning from your back to your side while in a flat bed without using bedrails?: A Lot Help needed moving from lying on your back to sitting on the side of a flat bed without using bedrails?: A Lot Help needed moving to and from a bed to a chair (including a wheelchair)?: A Little Help needed standing up from a chair using your arms (e.g., wheelchair or bedside chair)?: Total Help needed to walk in hospital room?: Total Help needed climbing 3-5 steps with a railing? : Total 6 Click Score: 10    End of Session Equipment Utilized During Treatment: Gait belt Activity Tolerance: Patient tolerated treatment well Patient left: in bed;with call bell/phone within reach;with chair alarm set Nurse Communication: Mobility status PT Visit Diagnosis: Unsteadiness on feet (R26.81);Other abnormalities of gait and mobility (R26.89);Muscle weakness (generalized) (M62.81);Pain Pain - Right/Left: Left Pain - part of  body: Shoulder     Time: 6387-5643 PT Time Calculation (min) (ACUTE ONLY): 15 min  Charges:    $Therapeutic Activity: 8-22 mins PT General Charges $$ ACUTE PT VISIT: 1 Visit                     Blima Rich PT, DPT [employer] 02/15/2023, 9:49 AM

## 2023-02-15 NOTE — Plan of Care (Signed)
  Problem: Clinical Measurements: Goal: Ability to maintain clinical measurements within normal limits will improve Outcome: Progressing Goal: Will remain free from infection Outcome: Progressing   Problem: Activity: Goal: Risk for activity intolerance will decrease Outcome: Progressing   Problem: Nutrition: Goal: Adequate nutrition will be maintained Outcome: Progressing   Problem: Elimination: Goal: Will not experience complications related to bowel motility Outcome: Progressing Goal: Will not experience complications related to urinary retention Outcome: Progressing   Problem: Pain Management: Goal: General experience of comfort will improve Outcome: Progressing   Problem: Safety: Goal: Ability to remain free from injury will improve Outcome: Progressing   Problem: Skin Integrity: Goal: Risk for impaired skin integrity will decrease Outcome: Progressing

## 2023-02-15 NOTE — Assessment & Plan Note (Deleted)
Incidentally noted on admission, repleted yesterday. 3.3 on BMP this AM. Will replete today.  - AM BMP, CBC

## 2023-02-15 NOTE — Assessment & Plan Note (Addendum)
Slightly improved from prior, patient is not having any symptoms. - Continue hydrochlorothiazide, losartan and amlodipine

## 2023-02-15 NOTE — Progress Notes (Signed)
     Daily Progress Note Intern Pager: [phone]  Patient name: Deborah Webb Medical record number: [id number] Date of birth: [birth year] Age: 72 y.o. Gender: female  Primary Care Provider: Corine Shelter, MD Consultants: orthopaedic surgery  Code Status: full code   Pt Overview and Major Events to Date:  12/2- admitted   Assessment and Plan: Deborah Webb is a 71 y.o. female w/ hx of dementia, HTN, HLD, seizures presenting after fall from standing w/ L tib/fib and L clavicular fx. Patient is being followed by orthopaedics who recommend allowing swelling to reduce prior to surgical fixation. BP has been elevated so restarted patient on home amlodipine yesterday with some improvement to BP today.   Assessment & Plan Fall with Ankle fracture and clavicular fracture Patient is being followed by orthopedics and recommend ORIF for ankle fracture pending improvement of swelling.  Clavicular fracture is stable and was treated nonoperatively with sling. Calculated Chales Abrahams Perioperative Risk for MI: 0.6%.  - ortho following, appreciate recs  - Pain:  - tylenol 1000 q6h sch - oxy scheduled 2.5 mg BID, 5 mg q6h prn - Bowel reg: Miralax daily - PT/OT - Continue Vit D and calcium, consider starting bisphosphonate's on d/c  Essential hypertension Slightly improved from prior, patient is not having any symptoms. - Continue hydrochlorothiazide, losartan and amlodipine Iron Deficiency Anemia - Continue daily iron   - recheck CBC in 6-8 weeks   Chronic and Stable Problems:  CKD: avoid nephrotoxins  Dementia: continue namenda and aricept Seizures: continue home lamictal Insomnia: continue to hold home Seroquel  HLD: continue statin, continue to hold aspirin   FEN/GI: regular diet  PPx: lovenox  Dispo:likely SNF vs home pending surgery and clinical improvement.  Subjective:  Patient appears well this AM. Complaining her leg is itching.   Objective: Temp:  [97.6 F (36.4  C)-98.6 F (37 C)] 98.4 F (36.9 C) (12/05 0548) Pulse Rate:  [67-77] 70 (12/05 0548) Resp:  [17-18] 17 (12/05 0548) BP: (143-159)/(75-87) 159/79 (12/05 0548) SpO2:  [100 %] 100 % (12/05 0548) Physical Exam: General: NAD, sitting up in chair, pleasant  Cardiovascular: RRR, no m/r/g Respiratory: CTAB, NWOB on RA Abdomen: soft, nontender, nondistended Extremities: left foot in ace wrap, no pain with palpation. Sensation intact on left and right toes.   Laboratory: Most recent CBC Lab Results  Component Value Date   WBC 7.0 02/13/2023   HGB 10.9 (L) 02/13/2023   HCT 33.2 (L) 02/13/2023   MCV 94.1 02/13/2023   PLT 162 02/13/2023   Most recent BMP    Latest Ref Rng & Units 02/14/2023    6:05 AM  BMP  Glucose 70 - 99 mg/dL 88   BUN 8 - 23 mg/dL 21   Creatinine 1.47 - 1.00 mg/dL 8.29   Sodium 562 - 130 mmol/L 137   Potassium 3.5 - 5.1 mmol/L 3.5   Chloride 98 - 111 mmol/L 102   CO2 22 - 32 mmol/L 28   Calcium 8.9 - 10.3 mg/dL 9.0     Other pertinent labs: none    Imaging/Diagnostic Tests: No new imaging  Penne Lash, MD 02/15/2023, 8:15 AM  PGY-1, [place] [facility] FPTS Intern pager: [phone], text pages welcome Secure chat group [facility] Teaching Service

## 2023-02-15 NOTE — Assessment & Plan Note (Deleted)
Her fracture with fall from standing height is diagnostic of osteoporosis.  It appears that patient was previously on high-dose vitamin D, but is not currently taking.  Will recheck vitamin D level now.  Given history of CKD, could consider starting Prolia as bisphosphonates are renally limited. - f/u Vit D

## 2023-02-16 ENCOUNTER — Encounter (HOSPITAL_COMMUNITY): Payer: Self-pay | Admitting: Family Medicine

## 2023-02-16 DIAGNOSIS — S82899S Other fracture of unspecified lower leg, sequela: Secondary | ICD-10-CM | POA: Diagnosis not present

## 2023-02-16 DIAGNOSIS — D649 Anemia, unspecified: Secondary | ICD-10-CM

## 2023-02-16 HISTORY — DX: Anemia, unspecified: D64.9

## 2023-02-16 LAB — CBC
HCT: 28.8 % — ABNORMAL LOW (ref 36.0–46.0)
HCT: 29 % — ABNORMAL LOW (ref 36.0–46.0)
Hemoglobin: 9.5 g/dL — ABNORMAL LOW (ref 12.0–15.0)
Hemoglobin: 9.6 g/dL — ABNORMAL LOW (ref 12.0–15.0)
MCH: 31.5 pg (ref 26.0–34.0)
MCH: 31.7 pg (ref 26.0–34.0)
MCHC: 33 g/dL (ref 30.0–36.0)
MCHC: 33.1 g/dL (ref 30.0–36.0)
MCV: 95.4 fL (ref 80.0–100.0)
MCV: 95.7 fL (ref 80.0–100.0)
Platelets: 189 10*3/uL (ref 150–400)
Platelets: 230 10*3/uL (ref 150–400)
RBC: 3.02 MIL/uL — ABNORMAL LOW (ref 3.87–5.11)
RBC: 3.03 MIL/uL — ABNORMAL LOW (ref 3.87–5.11)
RDW: 15 % (ref 11.5–15.5)
RDW: 14.8 % (ref 11.5–15.5)
WBC: 7.8 10*3/uL (ref 4.0–10.5)
WBC: 8.4 10*3/uL (ref 4.0–10.5)
nRBC: 0 % (ref 0.0–0.2)
nRBC: 0.2 % (ref 0.0–0.2)

## 2023-02-16 LAB — BASIC METABOLIC PANEL
Anion gap: 7 (ref 5–15)
BUN: 22 mg/dL (ref 8–23)
CO2: 30 mmol/L (ref 22–32)
Calcium: 8.7 mg/dL — ABNORMAL LOW (ref 8.9–10.3)
Chloride: 100 mmol/L (ref 98–111)
Creatinine, Ser: 1.28 mg/dL — ABNORMAL HIGH (ref 0.44–1.00)
GFR, Estimated: 45 mL/min — ABNORMAL LOW (ref >60)
Glucose, Bld: 99 mg/dL (ref 70–99)
Potassium: 3.7 mmol/L (ref 3.5–5.1)
Sodium: 137 mmol/L (ref 135–145)

## 2023-02-16 NOTE — Progress Notes (Signed)
Physical Therapy Treatment Patient Details Name: Deborah Webb MRN: [id number] DOB: Jul 15, 1950 Today's Date: 02/16/2023   History of Present Illness Pt is a 72 y/o F presenting with L clavicle and L ankle fracture after a fall. PMH includes TBI, mild dementia, HTN, HLD, and seizures.    PT Comments  Pt received in chair, now agreeable for return transfer from drop arm recliner>EOB, pt needing dense multimodal cues for LUE/LLE NWB precautions and with poor carryover of cues. +2 needed for safe lateral scoot transfer due to pt attempting to push with LUE/LLE while scooting and pt leaning backward instead of forward while scooting as per PTA cues. RN assisting on L side to guard/prevent pt from WB incorrectly. Pt c/o increased L ankle pain during transfer, RN notified. Patient will benefit from intensive inpatient follow up therapy, >3 hours/day.    If plan is discharge home, recommend the following: Direct supervision/assist for financial management;Assist for transportation;Help with stairs or ramp for entrance;Supervision due to cognitive status;A lot of help with bathing/dressing/bathroom;Two people to help with walking and/or transfers   Can travel by private vehicle        Equipment Recommendations  Other (comment) (TBD next venue of care)    Recommendations for Other Services       Precautions / Restrictions Precautions Precautions: Fall Precaution Comments: per Montez Morita 12/3 no shoulder ROM restrictions, sling for comfort Required Braces or Orthoses: Sling;Splint/Cast Splint/Cast: LLE Restrictions Weight Bearing Restrictions: Yes LUE Weight Bearing: Non weight bearing LLE Weight Bearing: Non weight bearing Other Position/Activity Restrictions: L UE in sling for comfort     Mobility  Bed Mobility Overal bed mobility: Needs Assistance Bed Mobility: Sit to Supine       Sit to supine: Mod assist   General bed mobility comments: multimodal cues to prevent pt from WB  through L hemibody, heavy LLE lift assist over edge of bed, decreased trunk control pt needs assist on LUE to prevent WB through her L elbow despite sling in place.    Transfers Overall transfer level: Needs assistance Equipment used: None (bed pad assist) Transfers: Bed to chair/wheelchair/BSC            Lateral/Scoot Transfers: Mod assist, +2 safety/equipment General transfer comment: R foot blocked to prevent sliding, bed pad assist, RN guarding her at R foot and RUE to prevent pt from pushing through R extremities. Pt with poor technique due to cognitive deficit and difficulty sequencing, needing increased assist to lift hips up over slightly higher bed from lower chair surface and some instability/risk of scooting bottom forward off of EOB but with dense cues and lift assist, pt able to scoot hips backward    Ambulation/Gait               General Gait Details: unable   Stairs             Wheelchair Mobility     Tilt Bed    Modified Rankin (Stroke Patients Only)       Balance Overall balance assessment: Needs assistance Sitting-balance support: Single extremity supported Sitting balance-Leahy Scale: Fair Sitting balance - Comments: some posterior lean while scooting       Standing balance comment: unable due to WB precautions, impaired memory, and impaired sequencing                            Cognition Arousal: Alert Behavior During Therapy: WFL for tasks assessed/performed Overall Cognitive  Status: History of cognitive impairments - at baseline Area of Impairment: Problem solving, Following commands, Safety/judgement, Awareness, Attention, Memory                   Current Attention Level: Sustained Memory: Decreased short-term memory, Decreased recall of precautions Following Commands: Follows one step commands consistently, Follows one step commands with increased time, Follows multi-step commands  inconsistently Safety/Judgement: Decreased awareness of safety, Decreased awareness of deficits Awareness: Emergent Problem Solving: Slow processing, Difficulty sequencing, Requires verbal cues General Comments: Patient with hx of dementia, has difficulty remembering NWB precautions and with sequencing for movement. PTA wrote out on piece of paper LUE/LLE NWB precautions and taped it to the end of her bed to remind her of precautions.        Exercises  Other Exercises Other Exercises: pt pulling ~700 on IS, handout to reinforce    General Comments General comments (skin integrity, edema, etc.): kept sling in place due to pt difficulty recalling LUE NWB precs in bed.      Pertinent Vitals/Pain Pain Assessment Pain Assessment: Faces Faces Pain Scale: Hurts even more Pain Location: L ankle with lateral scoot transfer back to bed Pain Descriptors / Indicators: Discomfort, Grimacing, Guarding, Moaning Pain Intervention(s): Limited activity within patient's tolerance, Monitored during session, Repositioned, Patient requesting pain meds-RN notified     PT Goals (current goals can now be found in the care plan section) Acute Rehab PT Goals Patient Stated Goal: to get stronger PT Goal Formulation: With patient Time For Goal Achievement: 02/27/23 Progress towards PT goals: Progressing toward goals    Frequency    Min 1X/week      PT Plan         AM-PAC PT "6 Clicks" Mobility   Outcome Measure  Help needed turning from your back to your side while in a flat bed without using bedrails?: A Lot (based on previous session/AM OT session; pt defers to get back to bed during this session) Help needed moving from lying on your back to sitting on the side of a flat bed without using bedrails?: A Lot Help needed moving to and from a bed to a chair (including a wheelchair)?: A Lot Help needed standing up from a chair using your arms (e.g., wheelchair or bedside chair)?: Total Help  needed to walk in hospital room?: Total Help needed climbing 3-5 steps with a railing? : Total 6 Click Score: 9    End of Session Equipment Utilized During Treatment:  (bed pads) Activity Tolerance: Other (comment);Patient limited by pain;Treatment limited secondary to medical complications (Comment) (difficulty sequencing due to cognitive deficit) Patient left: with call bell/phone within reach;in bed;with bed alarm set;Other (comment) (LLE elevated) Nurse Communication: Mobility status;Precautions;Weight bearing status;Other (comment);Patient requests pain meds (pt unable to recall her precs on L HB) PT Visit Diagnosis: Unsteadiness on feet (R26.81);Other abnormalities of gait and mobility (R26.89);Muscle weakness (generalized) (M62.81);Pain Pain - Right/Left: Left Pain - part of body: Ankle and joints of foot     Time: 1610-9604 PT Time Calculation (min) (ACUTE ONLY): 34 min  Charges:     $Therapeutic Activity: 23-37 mins PT General Charges $$ ACUTE PT VISIT: 1 Visit                     Azhane Eckart P., PTA Acute Rehabilitation Services Secure Chat Preferred 9a-5:30pm Office: [phone]    Dorathy Kinsman [facility] 02/16/2023, 6:53 PM

## 2023-02-16 NOTE — Assessment & Plan Note (Signed)
Patient had hemoglobin of 11.1 on admission, which remained stable at 10.9. Iron studies indicate iron deficiency anemia. However hemoglobin today dropped from 10.9-9.5.  Given patient's fall, concern for bleeding near fracture vs retroperitoneal hematoma. Reassuringly, patient is not complaining of abdominal pain at this time.  - PM CBC later today - if continues to decrease, consider CT A/P to rule retroperitoneal hematoma - continue daily iron, consider IV iron transfusion outpatient

## 2023-02-16 NOTE — Assessment & Plan Note (Deleted)
 Incidentally noted on admission, repleted yesterday. 3.3 on BMP this AM. Will replete today.  - AM BMP, CBC

## 2023-02-16 NOTE — Assessment & Plan Note (Deleted)
 Placed in sling in ED. Pain control and PT/OT as above.

## 2023-02-16 NOTE — Assessment & Plan Note (Addendum)
 Slightly improved from prior, patient is not having any symptoms. - Continue hydrochlorothiazide, losartan and amlodipine

## 2023-02-16 NOTE — Progress Notes (Signed)
Daily Progress Note Intern Pager: [phone]  Patient name: Deborah Webb Medical record number: [id number] Date of birth: 11-Jun-1950 Age: 72 y.o. Gender: female  Primary Care Provider: Corine Shelter, MD Consultants: orthopaedic surgery  Code Status: full code   Pt Overview and Major Events to Date:  12/2- admitted   Assessment and Plan: JOSSELYNE LIECHTY is a 72 y.o. female w/ hx of dementia, HTN, HLD, seizures presenting after fall from standing w/ L tib/fib and L clavicular fx. Patient is being followed by orthopaedics who recommend allowing swelling to reduce prior to surgical fixation.  Assessment & Plan Fall with Ankle fracture and clavicular fracture Patient is being followed by orthopedics and recommend ORIF for ankle fracture pending improvement of swelling.  Clavicular fracture is stable and was treated nonoperatively with sling. Calculated Chales Abrahams Perioperative Risk for MI: 0.6%.  - ortho following, appreciate recs  - Pain:  - tylenol 1000 q6h sch - oxy scheduled 2.5 mg BID, 5 mg q6h prn - Bowel reg: Miralax daily - PT/OT - Continue Vit D and calcium, consider starting bisphosphonate's on d/c  Essential hypertension Slightly improved from prior, patient is not having any symptoms. - Continue hydrochlorothiazide, losartan and amlodipine Anemia Patient had hemoglobin of 11.1 on admission, which remained stable at 10.9. Iron studies indicate iron deficiency anemia. However hemoglobin today dropped from 10.9-9.5.  Given patient's fall, concern for bleeding near fracture vs retroperitoneal hematoma. Reassuringly, patient is not complaining of abdominal pain at this time.  - PM CBC later today - if continues to decrease, consider CT A/P to rule retroperitoneal hematoma - continue daily iron, consider IV iron transfusion outpatient   Chronic and Stable Problems:  CKD: avoid nephrotoxins  Dementia: continue namenda and aricept Seizures: continue home  lamictal Insomnia: continue to hold home Seroquel  HLD: continue statin, continue to hold aspirin   FEN/GI: regular diet  PPx: lovenox  Dispo:likely SNF vs home pending surgery and clinical improvement.  Subjective:  Patient appears well this AM. Complaining her leg is itching.   Objective: Temp:  [97.6 F (36.4 C)-98.4 F (36.9 C)] 98.4 F (36.9 C) (12/06 0925) Pulse Rate:  [66-74] 66 (12/06 0925) Resp:  [14-18] 18 (12/06 0925) BP: (133-151)/(62-79) 136/63 (12/06 0925) SpO2:  [98 %-100 %] 99 % (12/06 0925) Physical Exam: General: Laying in bed, no acute distress.    RRR, no m/r/g Respiratory: CTAB, NWOB on RA Abdomen: soft, nontender, nondistended Extremities: left foot in ace wrap, no pain with palpation. Sensation intact on left and right toes.  Able to move both feet, though limited in moving left foot due to heaviness from wrapping.  Laboratory: Most recent CBC Lab Results  Component Value Date   WBC 7.8 02/16/2023   HGB 9.5 (L) 02/16/2023   HCT 28.8 (L) 02/16/2023   MCV 95.4 02/16/2023   PLT 189 02/16/2023   Most recent BMP    Latest Ref Rng & Units 02/16/2023    5:47 AM  BMP  Glucose 70 - 99 mg/dL 99   BUN 8 - 23 mg/dL 22   Creatinine 1.30 - 1.00 mg/dL 8.65   Sodium 784 - 696 mmol/L 137   Potassium 3.5 - 5.1 mmol/L 3.7   Chloride 98 - 111 mmol/L 100   CO2 22 - 32 mmol/L 30   Calcium 8.9 - 10.3 mg/dL 8.7     Other pertinent labs: none    Imaging/Diagnostic Tests: No new imaging  Georg Ruddle Delsy Etzkorn, MD 02/16/2023, 11:55 AM  PGY-1, [facility] Family Medicine FPTS Intern pager: [phone], text pages welcome Secure chat group [facility] Teaching Service

## 2023-02-16 NOTE — Assessment & Plan Note (Addendum)
 Patient is being followed by orthopedics and recommend ORIF for ankle fracture pending improvement of swelling.  Clavicular fracture is stable and was treated nonoperatively with sling. Calculated Chales Abrahams Perioperative Risk for MI: 0.6%.  - ortho following, appreciate recs  - Pain:  - tylenol 1000 q6h sch - oxy scheduled 2.5 mg BID, 5 mg q6h prn - Bowel reg: Miralax daily - PT/OT - Continue Vit D and calcium, consider starting bisphosphonate's on d/c

## 2023-02-16 NOTE — Assessment & Plan Note (Deleted)
 Her fracture with fall from standing height is diagnostic of osteoporosis.  It appears that patient was previously on high-dose vitamin D, but is not currently taking.  Will recheck vitamin D level now.  Given history of CKD, could consider starting Prolia as bisphosphonates are renally limited. - f/u Vit D

## 2023-02-16 NOTE — Progress Notes (Addendum)
Physical Therapy Treatment Patient Details Name: Deborah Webb MRN: [id number] DOB: 12-30-1950 Today's Date: 02/16/2023   History of Present Illness Pt is a 72 y/o F presenting with L clavicle and L ankle fracture after a fall. PMH includes TBI, mild dementia, HTN, HLD, and seizures.    PT Comments  Pt received in recliner, agreeable to therapy session and with good participation and tolerance for BLE exercises per HEP handout in reclined posture in chair. Pt defers transfer back to bed due to wanting to wait for lunch to digest. RN notified pt non-compliant with L HB NWB restrictions and will need +2 for safety/physical assist to transfer back to bed. Pt will continue to benefit from skilled rehab in a post acute setting to maximize functional gains before returning home.     If plan is discharge home, recommend the following: Direct supervision/assist for financial management;Assist for transportation;Help with stairs or ramp for entrance;Supervision due to cognitive status;A lot of help with walking and/or transfers;A lot of help with bathing/dressing/bathroom   Can travel by private vehicle        Equipment Recommendations  Other (comment) (TBD next venue of care)    Recommendations for Other Services       Precautions / Restrictions Precautions Precautions: Fall Precaution Comments: per Montez Morita 12/3 no shoulder ROM restrictions, sling for comfort Required Braces or Orthoses: Sling;Splint/Cast Splint/Cast: LLE Restrictions Weight Bearing Restrictions: Yes LUE Weight Bearing: Non weight bearing LLE Weight Bearing: Non weight bearing Other Position/Activity Restrictions: L UE in sling for comfort     Mobility  Bed Mobility Overal bed mobility: Needs Assistance             General bed mobility comments: pt defers return to supine, wanting to sit up in chair to eat lunch which arrived during session, readjusted in chair only.    Transfers Overall transfer level:  Needs assistance Equipment used: None               General transfer comment: pt defers transfer until after lunch, pt set up more forward in chair and needed reminders not to push with LUE when adjusting her hip position in the chair, sling also adjusted to reinforce LUE NWB precs.    Ambulation/Gait               General Gait Details: unable   Stairs             Wheelchair Mobility     Tilt Bed    Modified Rankin (Stroke Patients Only)       Balance Overall balance assessment: Needs assistance Sitting-balance support: Single extremity supported Sitting balance-Leahy Scale: Good Sitting balance - Comments: limited assessment long sitting in chair, pt mostly with back support due to not ready to get back to bed yet                                    Cognition Arousal: Alert Behavior During Therapy: WFL for tasks assessed/performed Overall Cognitive Status: History of cognitive impairments - at baseline Area of Impairment: Problem solving, Following commands, Safety/judgement, Awareness, Attention, Memory                   Current Attention Level: Sustained Memory: Decreased short-term memory, Decreased recall of precautions Following Commands: Follows one step commands consistently, Follows one step commands with increased time, Follows multi-step commands inconsistently Safety/Judgement: Decreased awareness of  safety, Decreased awareness of deficits Awareness: Emergent Problem Solving: Slow processing, Difficulty sequencing, Requires verbal cues General Comments: Patient with hx of dementia, has difficulty remembering NWB precautions and with sequencing for movement. PTA wrote out on piece of paper LUE/LLE NWB precautions and taped it to her wall to remind her of precautions.        Exercises General Exercises - Lower Extremity Ankle Circles/Pumps: AROM, Right, 10 reps, Supine Quad Sets: AROM, Left, 5 reps, Supine Short Arc  Quad: AROM, Right, 10 reps, Supine Heel Slides: AROM, AAROM, Both, 10 reps, Supine Hip ABduction/ADduction: AROM, AAROM, 10 reps, Supine (AA on LLE) Straight Leg Raises: AAROM, Left, 5 reps, Supine Other Exercises Other Exercises: HEP handout and IS handout brought to reinforce exercises for BLE and IS Pt pulling ~700 on IS.   General Comments General comments (skin integrity, edema, etc.): splint intact on L LE with ice pack strapped on top of ankle area, elevated on pillows at end of session; Sling hooked back on her LUE as pt had forgotten about LUE NWB and was trying to push through L wrist to adjust her position in the chair.      Pertinent Vitals/Pain Pain Assessment Pain Assessment: PAINAD Faces Pain Scale: Hurts little more Pain Location: L LE/ L UE with ROM in chair Pain Descriptors / Indicators: Discomfort, Grimacing Pain Intervention(s): Limited activity within patient's tolerance, Monitored during session, Repositioned, Ice applied (ice pack strapped over top of L ankle)    Home Living                          Prior Function            PT Goals (current goals can now be found in the care plan section) Acute Rehab PT Goals Patient Stated Goal: to get stronger PT Goal Formulation: With patient Time For Goal Achievement: 02/27/23 Progress towards PT goals: Progressing toward goals    Frequency    Min 1X/week      PT Plan      Co-evaluation              AM-PAC PT "6 Clicks" Mobility   Outcome Measure  Help needed turning from your back to your side while in a flat bed without using bedrails?: A Lot (based on previous session/AM OT session; pt defers to get back to bed during this session) Help needed moving from lying on your back to sitting on the side of a flat bed without using bedrails?: A Lot Help needed moving to and from a bed to a chair (including a wheelchair)?: A Lot Help needed standing up from a chair using your arms (e.g.,  wheelchair or bedside chair)?: Total Help needed to walk in hospital room?: Total Help needed climbing 3-5 steps with a railing? : Total 6 Click Score: 9    End of Session   Activity Tolerance: Other (comment) (pt eager to eat lunch and defers transfer training until after she eats, RN/NT notified) Patient left: in chair;with call bell/phone within reach;with chair alarm set Nurse Communication: Mobility status;Precautions;Weight bearing status;Other (comment) (pt unable to recall her precs on L HB) PT Visit Diagnosis: Unsteadiness on feet (R26.81);Other abnormalities of gait and mobility (R26.89);Muscle weakness (generalized) (M62.81);Pain Pain - Right/Left: Left Pain - part of body: Ankle and joints of foot     Time: 1351-1401 PT Time Calculation (min) (ACUTE ONLY): 10 min  Charges:    $Therapeutic Exercise: 8-22 mins PT  General Charges $$ ACUTE PT VISIT: 1 Visit                     Zeev Deakins P., PTA Acute Rehabilitation Services Secure Chat Preferred 9a-5:30pm Office: [phone]    Dorathy Kinsman [facility] 02/16/2023, 4:41 PM

## 2023-02-16 NOTE — Progress Notes (Signed)
Occupational Therapy Treatment Patient Details Name: Deborah Webb MRN: [id number] DOB: 03/14/1950 Today's Date: 02/16/2023   History of present illness Pt is a 72 y/o F presenting with L clavicle and L ankle fracture after a fall. PMH includes TBI, mild dementia, HTN, HLD, and seizures.   OT comments  Patient supine in bed and agreeable to OT.  Completed bed mobility with min assist, lateral scoot transfers into recliner with min assist.  She continues to require cueing for recall of NWB L UE/LE, sequencing and safety. She really does better with less cueing, as she gets confused easily and has difficulty follow multiple step commands.  LB dressing seated with max assist, educated on how to don sling but pt voices still very unsure with sling.  Reinforced importance of sling during transfers to avoid Wbing through UE. Worked on LUE ROM, pt voices discomfort with shoulder movement but tolerates up to approx 75 degrees.  Will follow acutely, continue to recommend >3hrs/day inpatient setting.       If plan is discharge home, recommend the following:  A lot of help with bathing/dressing/bathroom;Assist for transportation;Direct supervision/assist for financial management;Direct supervision/assist for medications management;Assistance with cooking/housework;A lot of help with walking and/or transfers   Equipment Recommendations  BSC/3in1;Wheelchair (measurements OT);Wheelchair cushion (measurements OT) (drop arm BSC)    Recommendations for Other Services Rehab consult    Precautions / Restrictions Precautions Precautions: Fall Precaution Comments: per Montez Morita 12/3 no shoulder ROM restrictions, sling for comfort Required Braces or Orthoses: Sling;Splint/Cast Splint/Cast: LLE Restrictions Weight Bearing Restrictions: Yes LUE Weight Bearing: Non weight bearing LLE Weight Bearing: Non weight bearing Other Position/Activity Restrictions: L UE in sling for comfort       Mobility Bed  Mobility Overal bed mobility: Needs Assistance Bed Mobility: Supine to Sit     Supine to sit: Min assist     General bed mobility comments: min assist for L LE towards EOB, hand help support to ascend trunk and cueing for scooting hips towards EOB.    Transfers Overall transfer level: Needs assistance Equipment used: None Transfers: Bed to chair/wheelchair/BSC            Lateral/Scoot Transfers: Min assist General transfer comment: sling on to ensure NWB to L UE, patient able to scoot towards R side with cueing for technique and sequencing.  Actually scoots better with decreased cueing, but once in chair difficulty problem solving how to further scoot into chair with only using R side.     Balance Overall balance assessment: Needs assistance Sitting-balance support: Single extremity supported (R foot on floor) Sitting balance-Leahy Scale: Good Sitting balance - Comments: supervision                                   ADL either performed or assessed with clinical judgement   ADL Overall ADL's : Needs assistance/impaired     Grooming: Set up;Sitting           Upper Body Dressing : Minimal assistance;Sitting Upper Body Dressing Details (indicate cue type and reason): requires assist for sling mgmt Lower Body Dressing: Maximal assistance;Sitting/lateral leans   Toilet Transfer: Minimal assistance Toilet Transfer Details (indicate cue type and reason): lateral scoot simulated to recliner on R side         Functional mobility during ADLs: Minimal assistance;Cueing for sequencing;Cueing for safety      Extremity/Trunk Assessment Upper Extremity Assessment Upper Extremity Assessment: Right  hand dominant;LUE deficits/detail LUE Deficits / Details: L UE in sling, reviewed for comfort only and OK for ROM.  Pt able to complete elbow/wrist/hand WFL; shoulder flexion to ~75% with discomfort LUE Coordination: decreased gross motor            Vision        Perception     Praxis      Cognition Arousal: Alert Behavior During Therapy: WFL for tasks assessed/performed Overall Cognitive Status: History of cognitive impairments - at baseline Area of Impairment: Problem solving, Following commands, Safety/judgement, Awareness, Attention, Memory                   Current Attention Level: Sustained Memory: Decreased short-term memory, Decreased recall of precautions Following Commands: Follows one step commands consistently, Follows one step commands with increased time, Follows multi-step commands inconsistently Safety/Judgement: Decreased awareness of safety, Decreased awareness of deficits Awareness: Emergent Problem Solving: Slow processing, Difficulty sequencing, Requires verbal cues General Comments: patient with hx of dementia, has difficulty remembering NWB precautions and with sequencing for movement        Exercises Exercises: General Upper Extremity, Other exercises General Exercises - Upper Extremity Shoulder Flexion: AAROM, Left, Seated Elbow Flexion: AROM, Left, 10 reps, Seated Elbow Extension: AROM, Left, 10 reps, Seated Other Exercises Other Exercises: AAROM L shoulder elevation x 5 reps    Shoulder Instructions       General Comments splint intact on L LE, elevated on pillows at end of session; unhooked sling but left in place as visual cue to avoid WB through UE and don when transferring    Pertinent Vitals/ Pain       Pain Assessment Pain Assessment: Faces Faces Pain Scale: Hurts a little bit Pain Location: L LE/ L UE with ROM Pain Descriptors / Indicators: Discomfort Pain Intervention(s): Limited activity within patient's tolerance, Monitored during session, Repositioned  Home Living                                          Prior Functioning/Environment              Frequency  Min 1X/week        Progress Toward Goals  OT Goals(current goals can now be found in the  care plan section)  Progress towards OT goals: Progressing toward goals  Acute Rehab OT Goals Patient Stated Goal: get better OT Goal Formulation: With patient Time For Goal Achievement: 02/27/23 Potential to Achieve Goals: Good  Plan      Co-evaluation                 AM-PAC OT "6 Clicks" Daily Activity     Outcome Measure   Help from another person eating meals?: A Little Help from another person taking care of personal grooming?: A Little Help from another person toileting, which includes using toliet, bedpan, or urinal?: A Lot Help from another person bathing (including washing, rinsing, drying)?: A Lot Help from another person to put on and taking off regular upper body clothing?: A Little Help from another person to put on and taking off regular lower body clothing?: A Lot 6 Click Score: 15    End of Session Equipment Utilized During Treatment: Other (comment) (sling)  OT Visit Diagnosis: Other abnormalities of gait and mobility (R26.89);Muscle weakness (generalized) (M62.81);Pain;History of falling (Z91.81) Pain - Right/Left: Left Pain - part of body:  Arm;Leg   Activity Tolerance Patient tolerated treatment well   Patient Left in chair;with call bell/phone within reach;with chair alarm set   Nurse Communication Mobility status;Weight bearing status;Precautions        Time: 6962-9528 OT Time Calculation (min): 29 min  Charges: OT General Charges $OT Visit: 1 Visit OT Treatments $Self Care/Home Management : 23-37 mins  Barry Brunner, OT Acute Rehabilitation Services Office [phone]   Deborah Webb 02/16/2023, 11:36 AM

## 2023-02-17 DIAGNOSIS — S82899S Other fracture of unspecified lower leg, sequela: Secondary | ICD-10-CM | POA: Diagnosis not present

## 2023-02-17 LAB — CBC
HCT: 28.6 % — ABNORMAL LOW (ref 36.0–46.0)
Hemoglobin: 9.3 g/dL — ABNORMAL LOW (ref 12.0–15.0)
MCH: 31 pg (ref 26.0–34.0)
MCHC: 32.5 g/dL (ref 30.0–36.0)
MCV: 95.3 fL (ref 80.0–100.0)
Platelets: 200 10*3/uL (ref 150–400)
RBC: 3 MIL/uL — ABNORMAL LOW (ref 3.87–5.11)
RDW: 14.9 % (ref 11.5–15.5)
WBC: 7.4 10*3/uL (ref 4.0–10.5)
nRBC: 0 % (ref 0.0–0.2)

## 2023-02-17 NOTE — Progress Notes (Signed)
     Daily Progress Note Intern Pager: [phone]  Patient name: Deborah Webb Medical record number: [id number] Date of birth: [birth year] Age: 72 y.o. Gender: female  Primary Care Provider: Corine Shelter, MD Consultants: Orthopedic surgery Code Status: Full  Pt Overview and Major Events to Date:  12/2: Admitted  Assessment and Plan: Deborah Webb is a 72 y.o. female who presented after fall from standing height with left tip/fib and left clavicular fracture awaiting swelling reduction prior to surgical fixation. Pertinent PMH/PSH includes CKD, dementia, seizures, insomnia, HTN, HLD.  Assessment & Plan Fall with Ankle fracture and clavicular fracture Currently scheduled for ORIF on 12/10 pending improvement in swelling.  Clavicular fracture is stable and was treated nonoperatively with sling. Calculated Chales Abrahams Perioperative Risk for MI: 0.6%.  Diagnosed with osteoporosis given fracture from fall at standing height.  Vitamin D normal. - Ortho following, appreciate recs  - Pain:  - tylenol 1000 q6h sch - oxy scheduled 2.5 mg BID, 5 mg q6h prn - Bowel reg: Miralax daily, Senokot daily - PT/OT - Continue Vit D and calcium, consider starting Prolia as bisphosphonates are renally limited Anemia Iron studies indicate iron deficiency anemia.  Precipitous drop in hemoglobin likely related to bleeding from fracture.  No abdominal pain, unremarkable abdominal physical exam which makes retroperitoneal hematoma less likely.  Hemoglobin stable. - Daily CBC - if continues to decrease, consider CT A/P to rule retroperitoneal hematoma - continue daily iron, consider IV iron transfusion outpatient  Chronic and Stable Issues: CKD: avoid nephrotoxins  Dementia: continue namenda and aricept Seizures: continue home lamictal Insomnia: continue to hold home Seroquel  HLD: continue statin, continue to hold aspirin  HTN: Continue amlodipine 5 mg daily, HCTZ 12.5 mg daily, losartan 100 mg  daily  FEN/GI: Regular PPx: Lovenox Dispo:SNF  pending PT eval after surgery .   Subjective:  Notes that pain is under control at this time, no specific complaints.  Objective: Temp:  [98.1 F (36.7 C)-98.6 F (37 C)] 98.6 F (37 C) (12/07 0440) Pulse Rate:  [66-71] 71 (12/07 0440) Resp:  [16-18] 16 (12/07 0440) BP: (136-163)/(63-85) 146/74 (12/07 0440) SpO2:  [97 %-100 %] 97 % (12/07 0440) Physical Exam: General: Well-appearing, NAD Cardiovascular: RRR, no murmurs appreciable Respiratory: CTAB, normal WOB Extremities: Left lower extremity wrapped  Laboratory: Most recent CBC Lab Results  Component Value Date   WBC 7.4 02/17/2023   HGB 9.3 (L) 02/17/2023   HCT 28.6 (L) 02/17/2023   MCV 95.3 02/17/2023   PLT 200 02/17/2023   Most recent BMP    Latest Ref Rng & Units 02/16/2023    5:47 AM  BMP  Glucose 70 - 99 mg/dL 99   BUN 8 - 23 mg/dL 22   Creatinine 4.40 - 1.00 mg/dL 3.47   Sodium 425 - 956 mmol/L 137   Potassium 3.5 - 5.1 mmol/L 3.7   Chloride 98 - 111 mmol/L 100   CO2 22 - 32 mmol/L 30   Calcium 8.9 - 10.3 mg/dL 8.7    Imaging/Diagnostic Tests: No recent imaging results.  Shelby Mattocks, DO 02/17/2023, 7:11 AM  PGY-3, [facility] FPTS Intern pager: [phone], text pages welcome Secure chat group [facility] Teaching Service

## 2023-02-17 NOTE — Assessment & Plan Note (Signed)
Iron studies indicate iron deficiency anemia.  Precipitous drop in hemoglobin likely related to bleeding from fracture.  No abdominal pain, unremarkable abdominal physical exam which makes retroperitoneal hematoma less likely.  Hemoglobin stable. - Daily CBC - if continues to decrease, consider CT A/P to rule retroperitoneal hematoma - continue daily iron, consider IV iron transfusion outpatient

## 2023-02-17 NOTE — Assessment & Plan Note (Signed)
Currently scheduled for ORIF on 12/10 pending improvement in swelling.  Clavicular fracture is stable and was treated nonoperatively with sling. Calculated Chales Abrahams Perioperative Risk for MI: 0.6%.  Diagnosed with osteoporosis given fracture from fall at standing height.  Vitamin D normal. - Ortho following, appreciate recs  - Pain:  - tylenol 1000 q6h sch - oxy scheduled 2.5 mg BID, 5 mg q6h prn - Bowel reg: Miralax daily, Senokot daily - PT/OT - Continue Vit D and calcium, consider starting Prolia as bisphosphonates are renally limited

## 2023-02-17 NOTE — Plan of Care (Signed)
  Problem: Education: Goal: Knowledge of General Education information will improve Description Including pain rating scale, medication(s)/side effects and non-pharmacologic comfort measures Outcome: Progressing   

## 2023-02-18 DIAGNOSIS — S82302A Unspecified fracture of lower end of left tibia, initial encounter for closed fracture: Secondary | ICD-10-CM | POA: Diagnosis not present

## 2023-02-18 DIAGNOSIS — D509 Iron deficiency anemia, unspecified: Secondary | ICD-10-CM

## 2023-02-18 LAB — CBC
HCT: 30.1 % — ABNORMAL LOW (ref 36.0–46.0)
Hemoglobin: 9.9 g/dL — ABNORMAL LOW (ref 12.0–15.0)
MCH: 31.6 pg (ref 26.0–34.0)
MCHC: 32.9 g/dL (ref 30.0–36.0)
MCV: 96.2 fL (ref 80.0–100.0)
Platelets: 214 10*3/uL (ref 150–400)
RBC: 3.13 MIL/uL — ABNORMAL LOW (ref 3.87–5.11)
RDW: 14.9 % (ref 11.5–15.5)
WBC: 8.7 10*3/uL (ref 4.0–10.5)
nRBC: 0 % (ref 0.0–0.2)

## 2023-02-18 NOTE — Progress Notes (Addendum)
Daily Progress Note Intern Pager: [phone]  Patient name: Deborah Webb Medical record number: [id number] Date of birth: Oct 30, 1950 Age: 72 y.o. Gender: female  Primary Care Provider: Corine Shelter, MD Consultants: Orthopedics Code Status: Full  Pt Overview and Major Events to Date:  12/2 Admitted  Assessment and Plan:  Deborah Webb is a 72 year old female PMH CKD, dementia, seizures, insomnia, HTN, HLD who presented after fall from standing height with left tibia/fibula and left clavicular fracture.  Currently awaiting swelling reduction prior to surgical fixation scheduled on 12/10. Assessment & Plan Fall with Ankle fracture and clavicular fracture Currently scheduled for ORIF on 12/10 pending improvement in swelling.  Clavicular fracture is stable and was treated nonoperatively with sling. Calculated Chales Abrahams Perioperative Risk for MI: 0.6%.  Diagnosed with osteoporosis given fracture from fall at standing height.  Vitamin D normal. - Ortho following, appreciate recs  - Pain:  - tylenol 1000 q6h sch - oxy scheduled 2.5 mg BID, 5 mg q6h prn - Bowel reg: Miralax daily, Senokot daily - PT/OT - Continue Vit D and calcium, consider starting Prolia as bisphosphonates are renally limited Anemia Iron studies indicate iron deficiency anemia.  Precipitous drop in hemoglobin likely related to bleeding from fracture.  Baseline hgb around, pending a.m. CBC - Daily CBC - if continues to decrease, consider CT A/P to rule retroperitoneal hematoma - PO ferrous sulfate every other day, consider IV iron transfusion outpatient  Chronic and Stable Issues: CKD: avoid nephrotoxins  Dementia: continue namenda and aricept Seizures: continue home lamictal Insomnia: continue to hold home Seroquel  HLD: continue statin, continue to hold aspirin  HTN: Continue amlodipine 5 mg daily, HCTZ 12.5 mg daily, losartan 100 mg daily  FEN/GI: Regular PPx: Lovenox Dispo: SNF pending PT  evaluation  Subjective:  States that she did not have any issues overnight and is doing well in terms of pain.  Objective: Temp:  [98.2 F (36.8 C)-98.6 F (37 C)] 98.6 F (37 C) (12/07 1935) Pulse Rate:  [71-77] 77 (12/07 1935) Resp:  [16-18] 18 (12/07 1935) BP: (122-146)/(64-74) 122/71 (12/07 1935) SpO2:  [97 %-100 %] 98 % (12/07 1935) Physical Exam: General: NAD, awake, alert, responsive all the questions Cardiovascular: RRR, no murmurs rubs or gallops Respiratory: Clear to auscultation bilaterally, no wheezes rales or crackles, no increased work of breathing on room air Abdomen: Soft, nontender to palpation, nondistended Extremities: No lower extremity edema, left ankle with wrapping in place, clean dry and intact, wiggles toes bilaterally, good cap refill in toes bilaterally, sling in place for clavicular fracture, 2+ radial pulses bilaterally  Laboratory: Most recent CBC Lab Results  Component Value Date   WBC 7.4 02/17/2023   HGB 9.3 (L) 02/17/2023   HCT 28.6 (L) 02/17/2023   MCV 95.3 02/17/2023   PLT 200 02/17/2023   Most recent BMP    Latest Ref Rng & Units 02/16/2023    5:47 AM  BMP  Glucose 70 - 99 mg/dL 99   BUN 8 - 23 mg/dL 22   Creatinine 3.66 - 1.00 mg/dL 4.40   Sodium 347 - 425 mmol/L 137   Potassium 3.5 - 5.1 mmol/L 3.7   Chloride 98 - 111 mmol/L 100   CO2 22 - 32 mmol/L 30   Calcium 8.9 - 10.3 mg/dL 8.7     Levin Erp, MD 02/18/2023, 12:05 AM  PGY-3, [facility] FPTS Intern pager: [phone], text pages welcome Secure chat group [facility] Teaching Service

## 2023-02-18 NOTE — Assessment & Plan Note (Addendum)
Iron studies indicate iron deficiency anemia.  Precipitous drop in hemoglobin likely related to bleeding from fracture.  Baseline hgb around, pending a.m. CBC - Daily CBC - if continues to decrease, consider CT A/P to rule retroperitoneal hematoma - PO ferrous sulfate every other day, consider IV iron transfusion outpatient

## 2023-02-18 NOTE — Plan of Care (Signed)

## 2023-02-18 NOTE — Assessment & Plan Note (Signed)
Currently scheduled for ORIF on 12/10 pending improvement in swelling.  Clavicular fracture is stable and was treated nonoperatively with sling. Calculated Chales Abrahams Perioperative Risk for MI: 0.6%.  Diagnosed with osteoporosis given fracture from fall at standing height.  Vitamin D normal. - Ortho following, appreciate recs  - Pain:  - tylenol 1000 q6h sch - oxy scheduled 2.5 mg BID, 5 mg q6h prn - Bowel reg: Miralax daily, Senokot daily - PT/OT - Continue Vit D and calcium, consider starting Prolia as bisphosphonates are renally limited

## 2023-02-19 ENCOUNTER — Telehealth: Payer: Self-pay

## 2023-02-19 DIAGNOSIS — S82302A Unspecified fracture of lower end of left tibia, initial encounter for closed fracture: Secondary | ICD-10-CM | POA: Diagnosis not present

## 2023-02-19 DIAGNOSIS — D509 Iron deficiency anemia, unspecified: Secondary | ICD-10-CM | POA: Insufficient documentation

## 2023-02-19 MED ORDER — HYDROCHLOROTHIAZIDE 12.5 MG PO TABS
12.5000 mg | ORAL_TABLET | Freq: Every day | ORAL | Status: AC
  Administered 2023-02-19: 12.5 mg via ORAL

## 2023-02-19 MED ORDER — ENOXAPARIN SODIUM 40 MG/0.4ML IJ SOSY
40.0000 mg | PREFILLED_SYRINGE | Freq: Every day | INTRAMUSCULAR | Status: DC
  Administered 2023-02-20 – 2023-02-22 (×3): 40 mg via SUBCUTANEOUS
  Filled 2023-02-19 (×3): qty 0.4

## 2023-02-19 MED ORDER — CEFAZOLIN SODIUM-DEXTROSE 2-4 GM/100ML-% IV SOLN: 2.0000 g | INTRAVENOUS | Status: AC

## 2023-02-19 MED ORDER — LOSARTAN POTASSIUM 50 MG PO TABS
100.0000 mg | ORAL_TABLET | Freq: Every day | ORAL | Status: AC
  Administered 2023-02-19: 100 mg via ORAL

## 2023-02-19 NOTE — Progress Notes (Signed)
     Daily Progress Note Intern Pager: [phone]  Patient name: Deborah Webb Medical record number: [id number] Date of birth: 23-Mar-1950 Age: 72 y.o. Gender: female  Primary Care Provider: Corine Shelter, MD Consultants: Ortho Code Status: Full  Pt Overview and Major Events to Date:  12/2: Admitted to FM TS  Assessment and Plan:  Tynleigh Agustin is a 72 year old female with past medical history of CKD, dementia, seizures, insomnia, HTN, HLD who presented after a fall from standing height with left tibia-fibula fracture and left clavicular fracture.  Currently awaiting swelling reduction prior to surgical fixation scheduled on 12/10. Assessment & Plan Fall with Ankle fracture and clavicular fracture Confirmed with Ortho for ORIF 12/10 pending improvement of swelling.  Clavicular fracture is stable, treated nonoperatively with sling.  Osteoporosis with vitamin D and calcium, could consider starting Prolia postsurgery. - Ortho following, appreciate recs  - Pain:  - tylenol 1000 q6h sch - oxy scheduled 2.5 mg BID, 5 mg q6h prn - Bowel reg: Miralax daily, Senokot daily - PT/OT -Vit D and calcium supplementation -N.p.o. after midnight -Hold appropriate medications in preparation for surgery. Anemia Hemoglobin this morning 9.9, steadily rising.  Will likely need close monitoring post surgical fixation tomorrow. - Daily CBC - if continues to decrease, consider CT A/P to rule retroperitoneal hematoma - PO ferrous sulfate every other day, consider IV iron transfusion outpatient  Chronic and Stable Problems:  CKD: Avoid nephrotoxins Dementia: Continue Namenda and Aricept Seizures: Continue home Lamictal Insomnia: Continue to old home Seroquel HLD: Continue statin, continue to hold aspirin HTN: Continue amlodipine 5 mg daily, HCTZ 12.5 mg daily, losartan 100 mg daily   FEN/GI: Regular PPx: Lovenox Dispo:SNF  pending surgery and PT evaluation .   Subjective:  Patient  awake and alert sitting up in bed on exam this morning.  She reports that she is as comfortable as she can be with no complaints.  Objective: Temp:  [97.9 F (36.6 C)-98.9 F (37.2 C)] 97.9 F (36.6 C) (12/09 0415) Pulse Rate:  [74-77] 74 (12/09 0415) Resp:  [16-18] 17 (12/09 0641) BP: (115-144)/(64-70) 144/70 (12/09 0641) SpO2:  [96 %-98 %] 96 % (12/09 0415) Physical Exam: General: Well-appearing female, no apparent distress Cardiovascular: RRR, no M/R/G Respiratory: CTAB, no increased work of breathing Abdomen: Flat, soft, nontender. Extremities: Left leg with soft cast in place.  Good capillary refill in the distal extremity, able to wiggle toes on command.  Some ongoing warmth and swelling in the lower extremity near the knee with tenderness to palpation.  Laboratory: Most recent CBC Lab Results  Component Value Date   WBC 8.7 02/18/2023   HGB 9.9 (L) 02/18/2023   HCT 30.1 (L) 02/18/2023   MCV 96.2 02/18/2023   PLT 214 02/18/2023   Most recent BMP    Latest Ref Rng & Units 02/16/2023    5:47 AM  BMP  Glucose 70 - 99 mg/dL 99   BUN 8 - 23 mg/dL 22   Creatinine 1.47 - 1.00 mg/dL 8.29   Sodium 562 - 130 mmol/L 137   Potassium 3.5 - 5.1 mmol/L 3.7   Chloride 98 - 111 mmol/L 100   CO2 22 - 32 mmol/L 30   Calcium 8.9 - 10.3 mg/dL 8.7     Gerrit Heck, DO 02/19/2023, 7:55 AM  PGY-1, [facility] FPTS Intern pager: [phone], text pages welcome Secure chat group [facility] Teaching Service

## 2023-02-19 NOTE — Telephone Encounter (Signed)
Auth Submission: NO AUTH NEEDED Site of care: Site of care: [facility] Payer: Medicare A/B Medication & CPT/J Code(s) submitted: Monoferric (Ferrci derisomaltose) [phone] Route of submission (phone, fax, portal):  Phone # Fax # Auth type: Buy/Bill PB Units/visits requested: 1000mg  x 1 dose Reference number:  Approval from: 02/19/23 to 04/13/23

## 2023-02-19 NOTE — Anesthesia Preprocedure Evaluation (Signed)
Anesthesia Evaluation  Patient identified by MRN, date of birth, ID band Patient awake    Reviewed: Allergy & Precautions, NPO status , Patient's Chart, lab work & pertinent test results, reviewed documented beta blocker date and time   Airway Mallampati: I  TM Distance: >3 FB Neck ROM: Full    Dental  (+) Chipped, Dental Advisory Given,    Pulmonary neg pulmonary ROS   Pulmonary exam normal breath sounds clear to auscultation       Cardiovascular hypertension, Pt. on medications and Pt. on home beta blockers Normal cardiovascular exam Rhythm:Regular Rate:Normal     Neuro/Psych Seizures - (last seizure "a month or 2 ago"),  PSYCHIATRIC DISORDERS     Dementia    GI/Hepatic negative GI ROS, Neg liver ROS,,,  Endo/Other  negative endocrine ROS    Renal/GU Renal InsufficiencyRenal diseaseLab Results      Component                Value               Date                      NA                       139                 02/20/2023                CL                       102                 02/20/2023                K                        4.3                 02/20/2023                CO2                      29                  02/20/2023                BUN                      28 (H)              02/20/2023                CREATININE               1.29 (H)            02/20/2023                GFRNONAA                 44 (L)              02/20/2023                CALCIUM                  9.1  02/20/2023                PHOS                     3.9                 08/20/2016                ALBUMIN                  3.0 (L)             04/25/2021                GLUCOSE                  104 (H)             02/20/2023             negative genitourinary   Musculoskeletal negative musculoskeletal ROS (+)    Abdominal   Peds  Hematology  (+) Blood dyscrasia, anemia Lab Results      Component                Value                Date                      WBC                      7.2                 02/20/2023                HGB                      9.7 (L)             02/20/2023                HCT                      29.6 (L)            02/20/2023                MCV                      95.8                02/20/2023                PLT                      247                 02/20/2023              Anesthesia Other Findings 73 year old female with past medical history of CKD, dementia, seizures, insomnia, HTN, HLD who presented after a fall from standing height with left tibia-fibula fracture and left clavicular fracture  Reproductive/Obstetrics                             Anesthesia Physical Anesthesia Plan  ASA: 3  Anesthesia Plan: General   Post-op Pain Management: Tylenol PO (pre-op)*   Induction: Intravenous  PONV Risk  Score and Plan: 3 and Ondansetron, Dexamethasone and Treatment may vary due to age or medical condition  Airway Management Planned: Oral ETT  Additional Equipment:   Intra-op Plan:   Post-operative Plan: Extubation in OR  Informed Consent: I have reviewed the patients History and Physical, chart, labs and discussed the procedure including the risks, benefits and alternatives for the proposed anesthesia with the patient or authorized representative who has indicated his/her understanding and acceptance.     Dental advisory given  Plan Discussed with: CRNA  Anesthesia Plan Comments:        Anesthesia Quick Evaluation

## 2023-02-19 NOTE — Assessment & Plan Note (Signed)
Hemoglobin this morning 9.9, steadily rising.  Will likely need close monitoring post surgical fixation tomorrow. - Daily CBC - if continues to decrease, consider CT A/P to rule retroperitoneal hematoma - PO ferrous sulfate every other day, consider IV iron transfusion outpatient

## 2023-02-19 NOTE — Addendum Note (Signed)
Addended by: Ihor Austin D on: 02/19/2023 10:38 AM   Modules accepted: Orders

## 2023-02-19 NOTE — Progress Notes (Signed)
Orthopaedic Trauma Service Progress Note  Patient ID: Deborah Webb MRN: [id number] DOB/AGE: 1950-08-03 72 y.o.  Subjective:  Doing ok  Pain tolerable  No new ortho issues    ROS As above  Objective:   VITALS:   Vitals:   02/19/23 0415 02/19/23 0641 02/19/23 0813 02/19/23 1341  BP: (!) 143/64 (!) 144/70 120/79 (!) 146/83  Pulse: 74  73 67  Resp: 18 17  17   Temp: 97.9 F (36.6 C)  98.9 F (37.2 C) 97.8 F (36.6 C)  TempSrc: Oral  Oral Oral  SpO2: 96%  97% 97%  Weight:      Height:        Estimated body mass index is 26.35 kg/m as calculated from the following:   Height as of this encounter: 5\' 11"  (1.803 m).   Weight as of this encounter: 85.7 kg.   Intake/Output      12/08 0701 12/09 0700 12/09 0701 12/10 0700   P.O. 960    Total Intake(mL/kg) 960 (11.2)    Urine (mL/kg/hr) 2400 (1.2)    Stool 0    Total Output 2400    Net -1440         Urine Occurrence 2 x    Stool Occurrence 1 x      LABS  No results found for this or any previous visit (from the past 24 hour(s)).   PHYSICAL EXAM:   Gen: sitting up in chair, eating dinner, husband at bedside  Lungs: unlabored Ext:       Left lower extremity   Splint partially taken down   Still moderate swelling medially but some wrinkling noted  Ext warm   + DP pulse    Assessment/Plan:      Anti-infectives (From admission, onward)    Start     Dose/Rate Route Frequency Ordered Stop   02/20/23 0745  ceFAZolin (ANCEF) IVPB 2g/100 mL premix        2 g 200 mL/hr over 30 Minutes Intravenous To ShortStay Surgical 02/19/23 0935 02/21/23 0745     .  72 y/o female s/p fall with L distal tibia and fibula fracture   - fall  - L distal tibia and fibula fracture, left medial mall fracture  Swelling seems to be marginal for OR tomorrow   Likely eval soft tissue in pre-op holding area   Webb need alternative fixation method  instead of plates and screws such as percutaneous fixation augmented by ex fix     Ice and elevate    Toes above nose    NwB L leg x 6-8 weeks   - Pain management:  Multimodal   - ABL anemia/Hemodynamics  Stable  - Medical issues   Per primary   - DVT/PE prophylaxis:  Lovenox   - ID:   Periop abx  - Metabolic Bone Disease:  Fragility fracture  Will need osteoporosis workup as outpatient   - FEN/GI prophylaxis/Foley/Lines:  Npo after MN   - Dispo:  OR tomorrow to address L leg fractures     Mearl Latin, PA-C [phone] (C) 02/19/2023, 5:43 PM  [facility] [address] [place] [address] [phone] Val Eagle) [phone] (F)    After 5pm and on the weekends please log on to Amion, go to orthopaedics and the look under  the [facility] Call for the provider(s) on call. You can also call our office at [phone] and then follow the prompts to be connected to the call team.  Patient ID: Deborah Webb, female   DOB: 06/08/1950, 72 y.o.   MRN: [id number]

## 2023-02-19 NOTE — Plan of Care (Signed)
  Problem: Education: Goal: Knowledge of General Education information will improve Description: Including pain rating scale, medication(s)/side effects and non-pharmacologic comfort measures Outcome: Progressing   Problem: Clinical Measurements: Goal: Will remain free from infection Outcome: Progressing Goal: Respiratory complications will improve Outcome: Progressing Goal: Cardiovascular complication will be avoided Outcome: Progressing   Problem: Activity: Goal: Risk for activity intolerance will decrease Outcome: Progressing   Problem: Nutrition: Goal: Adequate nutrition will be maintained Outcome: Progressing   Problem: Coping: Goal: Level of anxiety will decrease Outcome: Progressing   Problem: Elimination: Goal: Will not experience complications related to bowel motility Outcome: Progressing   Problem: Pain Management: Goal: General experience of comfort will improve Outcome: Progressing   Problem: Safety: Goal: Ability to remain free from injury will improve Outcome: Progressing

## 2023-02-19 NOTE — Assessment & Plan Note (Addendum)
Confirmed with Ortho for ORIF 12/10 pending improvement of swelling.  Clavicular fracture is stable, treated nonoperatively with sling.  Osteoporosis with vitamin D and calcium, could consider starting Prolia postsurgery. - Ortho following, appreciate recs  - Pain:  - tylenol 1000 q6h sch - oxy scheduled 2.5 mg BID, 5 mg q6h prn - Bowel reg: Miralax daily, Senokot daily - PT/OT -Vit D and calcium supplementation -N.p.o. after midnight -Hold appropriate medications in preparation for surgery.

## 2023-02-19 NOTE — Addendum Note (Signed)
Addended by: Ihor Austin D on: 02/19/2023 11:12 AM   Modules accepted: Orders

## 2023-02-19 NOTE — Telephone Encounter (Addendum)
Patient has been referred by inpatient [facility]. Patient's insurance coverage is [employer].  Feraheme 510 x 2   Demetrius Charity, PharmD

## 2023-02-19 NOTE — Plan of Care (Signed)

## 2023-02-19 NOTE — Progress Notes (Signed)
Physical Therapy Treatment Patient Details Name: Deborah Webb MRN: [id number] DOB: 09/10/1950 Today's Date: 02/19/2023   History of Present Illness Pt is a 72 y/o F presenting with L clavicle and L ankle fracture after a fall. PMH includes TBI, mild dementia, HTN, HLD, and seizures.    PT Comments  Pt received in bed and pleasant and agreeable to mobility and OOB. Needs mod A to roll L, max A to roll R due to being unable to use LLE and LUE. Continues to need vc's for WB status with mobility due to memory deficits and sequencing issues. Mod A +2 needed to come to sitting EOB and to scoot to R into recliner. Pt assisted with BLE exercises in bed and recliner. Tightness noted L knee and pt assisted with stretching. Limited by L ankle pain with L knee ROM. Patient will benefit from continued inpatient follow up therapy, <3 hours/day. PT will continue to follow.      If plan is discharge home, recommend the following: Direct supervision/assist for financial management;Assist for transportation;Help with stairs or ramp for entrance;Supervision due to cognitive status;A lot of help with bathing/dressing/bathroom;Two people to help with walking and/or transfers   Can travel by private vehicle     No  Equipment Recommendations  Other (comment) (TBD next venue of care)    Recommendations for Other Services       Precautions / Restrictions Precautions Precautions: Fall Precaution Comments: per Montez Morita 12/3 no shoulder ROM restrictions, sling for comfort Required Braces or Orthoses: Sling;Splint/Cast Splint/Cast: LLE Restrictions Weight Bearing Restrictions: Yes LUE Weight Bearing: Non weight bearing LLE Weight Bearing: Non weight bearing Other Position/Activity Restrictions: L UE in sling for comfort     Mobility  Bed Mobility Overal bed mobility: Needs Assistance Bed Mobility: Supine to Sit, Rolling Rolling: Mod assist, Max assist   Supine to sit: Mod assist     General  bed mobility comments: max A to roll to R, mod A to L due to being able to grasp rail with LUE and push with LLE. Mesh panties donned before transfer to hold purewick in place    Transfers Overall transfer level: Needs assistance Equipment used: None (bed pad assist) Transfers: Bed to chair/wheelchair/BSC            Lateral/Scoot Transfers: Mod assist, +2 safety/equipment General transfer comment: pt transferred into drop arm recliner on R. Has difficulty scooting hips and needs lift from bed pad. Assist needed to keep LLE NWB. Sling used to remind pt not to use LUE. Initiates well but rolls on R hip rather than unweighting it fully to scoot it. +2 needed to complete transfer and then scoot to R and back into recliner    Ambulation/Gait               General Gait Details: unable until WB status increased   Stairs             Wheelchair Mobility     Tilt Bed    Modified Rankin (Stroke Patients Only)       Balance Overall balance assessment: Needs assistance Sitting-balance support: Single extremity supported Sitting balance-Leahy Scale: Fair Sitting balance - Comments: occasional posterior lean       Standing balance comment: unable due to WB precautions, impaired memory, and impaired sequencing                            Cognition Arousal: Alert Behavior  During Therapy: WFL for tasks assessed/performed Overall Cognitive Status: History of cognitive impairments - at baseline Area of Impairment: Problem solving, Following commands, Safety/judgement, Awareness, Attention, Memory                   Current Attention Level: Sustained Memory: Decreased short-term memory, Decreased recall of precautions Following Commands: Follows one step commands consistently, Follows one step commands with increased time, Follows multi-step commands inconsistently Safety/Judgement: Decreased awareness of safety, Decreased awareness of deficits Awareness:  Emergent Problem Solving: Slow processing, Difficulty sequencing, Requires verbal cues General Comments: pt remembered that she is to look at foot of bed to find WB precautions and was then able to verbaliza them. However, still needs vc's to keep them during functional mobility        Exercises General Exercises - Lower Extremity Ankle Circles/Pumps: AROM, Right, 10 reps, Supine Quad Sets: AROM, Left, 5 reps, Supine Long Arc Quad: Left, 5 reps, Seated, AAROM Heel Slides: AROM, AAROM, Both, 10 reps, Supine (AA on L, pt very tight L knee) Straight Leg Raises: AAROM, Left, 5 reps, Supine    General Comments General comments (skin integrity, edema, etc.): VSS      Pertinent Vitals/Pain Pain Assessment Pain Assessment: Faces Faces Pain Scale: Hurts even more Pain Location: L ankle with all mvmt of LLE Pain Descriptors / Indicators: Discomfort, Grimacing, Guarding Pain Intervention(s): Limited activity within patient's tolerance, Monitored during session    Home Living                          Prior Function            PT Goals (current goals can now be found in the care plan section) Acute Rehab PT Goals Patient Stated Goal: to get stronger PT Goal Formulation: With patient Time For Goal Achievement: 02/27/23 Potential to Achieve Goals: Good Progress towards PT goals: Progressing toward goals    Frequency    Min 1X/week      PT Plan      Co-evaluation              AM-PAC PT "6 Clicks" Mobility   Outcome Measure  Help needed turning from your back to your side while in a flat bed without using bedrails?: A Lot Help needed moving from lying on your back to sitting on the side of a flat bed without using bedrails?: A Lot Help needed moving to and from a bed to a chair (including a wheelchair)?: A Lot Help needed standing up from a chair using your arms (e.g., wheelchair or bedside chair)?: Total Help needed to walk in hospital room?: Total Help  needed climbing 3-5 steps with a railing? : Total 6 Click Score: 9    End of Session   Activity Tolerance: Patient tolerated treatment well Patient left: with call bell/phone within reach;in chair;with chair alarm set;Other (comment) (LLE elevated) Nurse Communication: Mobility status;Precautions;Weight bearing status PT Visit Diagnosis: Unsteadiness on feet (R26.81);Other abnormalities of gait and mobility (R26.89);Muscle weakness (generalized) (M62.81);Pain Pain - Right/Left: Left Pain - part of body: Ankle and joints of foot     Time: 2595-6387 PT Time Calculation (min) (ACUTE ONLY): 24 min  Charges:    $Therapeutic Exercise: 8-22 mins $Therapeutic Activity: 8-22 mins PT General Charges $$ ACUTE PT VISIT: 1 Visit                     Lyanne Co, PT  Acute Rehab  Services Secure chat preferred Office [phone]    Lawana Chambers Zalia Hautala 02/19/2023, 10:48 AM

## 2023-02-20 ENCOUNTER — Inpatient Hospital Stay (HOSPITAL_COMMUNITY): Payer: Medicare Other

## 2023-02-20 ENCOUNTER — Telehealth: Payer: Self-pay | Admitting: Pharmacy Technician

## 2023-02-20 ENCOUNTER — Other Ambulatory Visit: Payer: Self-pay

## 2023-02-20 ENCOUNTER — Encounter (HOSPITAL_COMMUNITY)
Admission: EM | Disposition: A | Discharge status: Skilled Nursing Facility | Payer: Self-pay | Source: Home / Self Care | Attending: Family Medicine

## 2023-02-20 ENCOUNTER — Encounter (HOSPITAL_COMMUNITY): Payer: Self-pay | Admitting: Family Medicine

## 2023-02-20 DIAGNOSIS — S82302A Unspecified fracture of lower end of left tibia, initial encounter for closed fracture: Secondary | ICD-10-CM

## 2023-02-20 HISTORY — PX: ORIF ANKLE FRACTURE: SHX5408

## 2023-02-20 LAB — CBC
HCT: 29.6 % — ABNORMAL LOW (ref 36.0–46.0)
Hemoglobin: 9.7 g/dL — ABNORMAL LOW (ref 12.0–15.0)
MCH: 31.4 pg (ref 26.0–34.0)
MCHC: 32.8 g/dL (ref 30.0–36.0)
MCV: 95.8 fL (ref 80.0–100.0)
Platelets: 247 10*3/uL (ref 150–400)
RBC: 3.09 MIL/uL — ABNORMAL LOW (ref 3.87–5.11)
RDW: 14.7 % (ref 11.5–15.5)
WBC: 7.2 10*3/uL (ref 4.0–10.5)
nRBC: 0 % (ref 0.0–0.2)

## 2023-02-20 LAB — BASIC METABOLIC PANEL
Anion gap: 8 (ref 5–15)
BUN: 28 mg/dL — ABNORMAL HIGH (ref 8–23)
CO2: 29 mmol/L (ref 22–32)
Calcium: 9.1 mg/dL (ref 8.9–10.3)
Chloride: 102 mmol/L (ref 98–111)
Creatinine, Ser: 1.29 mg/dL — ABNORMAL HIGH (ref 0.44–1.00)
GFR, Estimated: 44 mL/min — ABNORMAL LOW (ref >60)
Glucose, Bld: 104 mg/dL — ABNORMAL HIGH (ref 70–99)
Potassium: 4.3 mmol/L (ref 3.5–5.1)
Sodium: 139 mmol/L (ref 135–145)

## 2023-02-20 LAB — HEMOGLOBIN AND HEMATOCRIT, BLOOD
HCT: 29.3 % — ABNORMAL LOW (ref 36.0–46.0)
Hemoglobin: 9.9 g/dL — ABNORMAL LOW (ref 12.0–15.0)

## 2023-02-20 SURGERY — OPEN REDUCTION INTERNAL FIXATION (ORIF) ANKLE FRACTURE
Anesthesia: General | Site: Ankle | Laterality: Left

## 2023-02-20 MED ORDER — LOSARTAN POTASSIUM 50 MG PO TABS
100.0000 mg | ORAL_TABLET | Freq: Every day | ORAL | Status: DC
  Administered 2023-02-20 – 2023-02-22 (×3): 100 mg via ORAL
  Filled 2023-02-20 (×3): qty 2

## 2023-02-20 MED ORDER — LABETALOL HCL 5 MG/ML IV SOLN
INTRAVENOUS | Status: DC | PRN
  Administered 2023-02-20: 5 mg via INTRAVENOUS

## 2023-02-20 MED ORDER — ROCURONIUM BROMIDE 10 MG/ML (PF) SYRINGE
PREFILLED_SYRINGE | INTRAVENOUS | Status: AC
  Filled 2023-02-20: qty 10

## 2023-02-20 MED ORDER — CEFAZOLIN SODIUM-DEXTROSE 2-4 GM/100ML-% IV SOLN
2.0000 g | INTRAVENOUS | Status: AC
Start: 2023-02-20 — End: 2023-02-20
  Administered 2023-02-20: 2 g via INTRAVENOUS
  Filled 2023-02-20: qty 100

## 2023-02-20 MED ORDER — PHENYLEPHRINE HCL-NACL 20-0.9 MG/250ML-% IV SOLN
INTRAVENOUS | Status: AC
  Filled 2023-02-20: qty 500

## 2023-02-20 MED ORDER — ONDANSETRON HCL 4 MG/2ML IJ SOLN
INTRAMUSCULAR | Status: AC
  Filled 2023-02-20: qty 2

## 2023-02-20 MED ORDER — FENTANYL CITRATE (PF) 250 MCG/5ML IJ SOLN
INTRAMUSCULAR | Status: DC | PRN
  Administered 2023-02-20: 25 ug via INTRAVENOUS
  Administered 2023-02-20 (×3): 50 ug via INTRAVENOUS

## 2023-02-20 MED ORDER — PHENYLEPHRINE 80 MCG/ML (10ML) SYRINGE FOR IV PUSH (FOR BLOOD PRESSURE SUPPORT)
PREFILLED_SYRINGE | INTRAVENOUS | Status: DC | PRN
  Administered 2023-02-20: 160 ug via INTRAVENOUS

## 2023-02-20 MED ORDER — OXYCODONE HCL 5 MG PO TABS
5.0000 mg | ORAL_TABLET | Freq: Three times a day (TID) | ORAL | Status: DC | PRN
  Administered 2023-02-20 – 2023-02-21 (×2): 5 mg via ORAL
  Filled 2023-02-20 (×2): qty 1

## 2023-02-20 MED ORDER — ACETAMINOPHEN 500 MG PO TABS
1000.0000 mg | ORAL_TABLET | Freq: Once | ORAL | Status: AC
  Administered 2023-02-20: 1000 mg via ORAL
  Filled 2023-02-20: qty 2

## 2023-02-20 MED ORDER — DEXAMETHASONE SODIUM PHOSPHATE 10 MG/ML IJ SOLN
INTRAMUSCULAR | Status: DC | PRN
  Administered 2023-02-20: 8 mg via INTRAVENOUS

## 2023-02-20 MED ORDER — LOSARTAN POTASSIUM-HCTZ 100-12.5 MG PO TABS: 1.0000 | ORAL_TABLET | Freq: Every day | ORAL | Status: DC

## 2023-02-20 MED ORDER — DEXAMETHASONE SODIUM PHOSPHATE 10 MG/ML IJ SOLN
INTRAMUSCULAR | Status: AC
  Filled 2023-02-20: qty 1

## 2023-02-20 MED ORDER — LABETALOL HCL 5 MG/ML IV SOLN
10.0000 mg | INTRAVENOUS | Status: DC | PRN
  Administered 2023-02-20: 10 mg via INTRAVENOUS

## 2023-02-20 MED ORDER — FENTANYL CITRATE (PF) 100 MCG/2ML IJ SOLN
25.0000 ug | INTRAMUSCULAR | Status: DC | PRN
  Administered 2023-02-20 (×3): 25 ug via INTRAVENOUS

## 2023-02-20 MED ORDER — SODIUM CHLORIDE 0.9 % IV SOLN: INTRAVENOUS | Status: DC

## 2023-02-20 MED ORDER — LIDOCAINE 2% (20 MG/ML) 5 ML SYRINGE
INTRAMUSCULAR | Status: DC | PRN
  Administered 2023-02-20: 60 mg via INTRAVENOUS

## 2023-02-20 MED ORDER — PROPOFOL 1000 MG/100ML IV EMUL
INTRAVENOUS | Status: AC
  Filled 2023-02-20: qty 100

## 2023-02-20 MED ORDER — FENTANYL CITRATE (PF) 250 MCG/5ML IJ SOLN
INTRAMUSCULAR | Status: AC
  Filled 2023-02-20: qty 5

## 2023-02-20 MED ORDER — FENTANYL CITRATE (PF) 100 MCG/2ML IJ SOLN
INTRAMUSCULAR | Status: AC
  Filled 2023-02-20: qty 2

## 2023-02-20 MED ORDER — LABETALOL HCL 5 MG/ML IV SOLN
INTRAVENOUS | Status: AC
  Filled 2023-02-20: qty 4

## 2023-02-20 MED ORDER — OXYCODONE HCL 5 MG PO TABS
5.0000 mg | ORAL_TABLET | Freq: Two times a day (BID) | ORAL | Status: DC
  Administered 2023-02-20 – 2023-02-22 (×4): 5 mg via ORAL
  Filled 2023-02-20 (×4): qty 1

## 2023-02-20 MED ORDER — PROPOFOL 10 MG/ML IV BOLUS
INTRAVENOUS | Status: AC
  Filled 2023-02-20: qty 20

## 2023-02-20 MED ORDER — POVIDONE-IODINE 10 % EX SWAB
2.0000 | Freq: Once | CUTANEOUS | Status: AC
  Administered 2023-02-20: 2 via TOPICAL

## 2023-02-20 MED ORDER — LIDOCAINE 2% (20 MG/ML) 5 ML SYRINGE
INTRAMUSCULAR | Status: AC
  Filled 2023-02-20: qty 5

## 2023-02-20 MED ORDER — MORPHINE SULFATE (PF) 4 MG/ML IV SOLN: 4.0000 mg | INTRAVENOUS | Status: DC | PRN

## 2023-02-20 MED ORDER — 0.9 % SODIUM CHLORIDE (POUR BTL) OPTIME
TOPICAL | Status: DC | PRN
  Administered 2023-02-20: 1000 mL

## 2023-02-20 MED ORDER — CHLORHEXIDINE GLUCONATE 4 % EX SOLN: 60.0000 mL | Freq: Once | CUTANEOUS | Status: DC

## 2023-02-20 MED ORDER — ALBUMIN HUMAN 5 % IV SOLN: INTRAVENOUS | Status: DC | PRN

## 2023-02-20 MED ORDER — TRANEXAMIC ACID-NACL 1000-0.7 MG/100ML-% IV SOLN
1000.0000 mg | INTRAVENOUS | Status: AC
Start: 2023-02-20 — End: 2023-02-20
  Administered 2023-02-20: 1000 mg via INTRAVENOUS
  Filled 2023-02-20: qty 100

## 2023-02-20 MED ORDER — ORAL CARE MOUTH RINSE: 15.0000 mL | Freq: Once | OROMUCOSAL | Status: AC

## 2023-02-20 MED ORDER — MORPHINE SULFATE (PF) 2 MG/ML IV SOLN
2.0000 mg | INTRAVENOUS | Status: DC | PRN
  Administered 2023-02-20 (×3): 2 mg via INTRAVENOUS
  Filled 2023-02-20 (×3): qty 1

## 2023-02-20 MED ORDER — CEFAZOLIN SODIUM-DEXTROSE 2-4 GM/100ML-% IV SOLN
2.0000 g | Freq: Three times a day (TID) | INTRAVENOUS | Status: AC
  Administered 2023-02-20 – 2023-02-21 (×3): 2 g via INTRAVENOUS
  Filled 2023-02-20 (×3): qty 100

## 2023-02-20 MED ORDER — CHLORHEXIDINE GLUCONATE 0.12 % MT SOLN
15.0000 mL | Freq: Once | OROMUCOSAL | Status: AC
  Administered 2023-02-20: 15 mL via OROMUCOSAL
  Filled 2023-02-20: qty 15

## 2023-02-20 MED ORDER — HYDROCHLOROTHIAZIDE 12.5 MG PO TABS
12.5000 mg | ORAL_TABLET | Freq: Every day | ORAL | Status: DC
  Administered 2023-02-20 – 2023-02-22 (×3): 12.5 mg via ORAL
  Filled 2023-02-20 (×3): qty 1

## 2023-02-20 MED ORDER — ROCURONIUM BROMIDE 10 MG/ML (PF) SYRINGE
PREFILLED_SYRINGE | INTRAVENOUS | Status: DC | PRN
  Administered 2023-02-20: 20 mg via INTRAVENOUS
  Administered 2023-02-20: 50 mg via INTRAVENOUS
  Administered 2023-02-20 (×2): 10 mg via INTRAVENOUS

## 2023-02-20 MED ORDER — SUGAMMADEX SODIUM 200 MG/2ML IV SOLN
INTRAVENOUS | Status: DC | PRN
  Administered 2023-02-20: 200 mg via INTRAVENOUS

## 2023-02-20 MED ORDER — PROPOFOL 10 MG/ML IV BOLUS
INTRAVENOUS | Status: DC | PRN
  Administered 2023-02-20: 80 mg via INTRAVENOUS
  Administered 2023-02-20: 30 mg via INTRAVENOUS
  Administered 2023-02-20: 20 mg via INTRAVENOUS

## 2023-02-20 SURGICAL SUPPLY — 70 items
BAG COUNTER SPONGE SURGICOUNT (BAG) ×1 IMPLANT
BANDAGE ESMARK 6X9 LF (GAUZE/BANDAGES/DRESSINGS) ×1 IMPLANT
BIT DRILL SHORT 2.5 (BIT) IMPLANT
BIT DRL SHORT 2.5 (BIT) ×1
BNDG ELASTIC 4INX 5YD STR LF (GAUZE/BANDAGES/DRESSINGS) IMPLANT
BNDG ELASTIC 4X5.8 VLCR STR LF (GAUZE/BANDAGES/DRESSINGS) IMPLANT
BNDG ELASTIC 6INX 5YD STR LF (GAUZE/BANDAGES/DRESSINGS) IMPLANT
BNDG ELASTIC 6X5.8 VLCR STR LF (GAUZE/BANDAGES/DRESSINGS) IMPLANT
BNDG ESMARK 6X9 LF (GAUZE/BANDAGES/DRESSINGS)
BNDG GAUZE DERMACEA FLUFF 4 (GAUZE/BANDAGES/DRESSINGS) ×2 IMPLANT
BRUSH SCRUB EZ PLAIN DRY (MISCELLANEOUS) ×2 IMPLANT
COVER MAYO STAND STRL (DRAPES) ×1 IMPLANT
COVER SURGICAL LIGHT HANDLE (MISCELLANEOUS) ×1 IMPLANT
CUFF TRNQT CYL 34X4.125X (TOURNIQUET CUFF) ×1 IMPLANT
DRAPE C-ARM 42X72 X-RAY (DRAPES) ×1 IMPLANT
DRAPE C-ARMOR (DRAPES) ×1 IMPLANT
DRAPE HALF SHEET 40X57 (DRAPES) ×1 IMPLANT
DRAPE U-SHAPE 47X51 STRL (DRAPES) ×1 IMPLANT
DRIVER RETENTION T15 LONG (ORTHOPEDIC DISPOSABLE SUPPLIES) IMPLANT
DRSG EMULSION OIL 3X3 NADH (GAUZE/BANDAGES/DRESSINGS) IMPLANT
DRSG MEPITEL 4X7.2 (GAUZE/BANDAGES/DRESSINGS) IMPLANT
ELECT REM PT RETURN 9FT ADLT (ELECTROSURGICAL) ×1
ELECTRODE REM PT RTRN 9FT ADLT (ELECTROSURGICAL) ×1 IMPLANT
GAUZE PAD ABD 8X10 STRL (GAUZE/BANDAGES/DRESSINGS) IMPLANT
GAUZE SPONGE 4X4 12PLY STRL (GAUZE/BANDAGES/DRESSINGS) ×2 IMPLANT
GLOVE BIO SURGEON STRL SZ7.5 (GLOVE) ×1 IMPLANT
GLOVE BIO SURGEON STRL SZ8 (GLOVE) ×1 IMPLANT
GLOVE BIOGEL PI IND STRL 7.5 (GLOVE) ×1 IMPLANT
GLOVE BIOGEL PI IND STRL 8 (GLOVE) ×1 IMPLANT
GLOVE SURG ORTHO LTX SZ7.5 (GLOVE) ×2 IMPLANT
GOWN STRL REUS W/ TWL LRG LVL3 (GOWN DISPOSABLE) ×2 IMPLANT
GOWN STRL REUS W/ TWL XL LVL3 (GOWN DISPOSABLE) ×1 IMPLANT
K-WIRE ALPS MXV 1.6X6 ZI (WIRE) ×1
KIT BASIN OR (CUSTOM PROCEDURE TRAY) ×1 IMPLANT
KIT TURNOVER KIT B (KITS) ×1 IMPLANT
KWIRE ALPS MXV 1.6X6 ZI (WIRE) IMPLANT
MANIFOLD NEPTUNE II (INSTRUMENTS) ×1 IMPLANT
NDL HYPO 21X1.5 SAFETY (NEEDLE) IMPLANT
NEEDLE HYPO 21X1.5 SAFETY (NEEDLE) IMPLANT
NS IRRIG 1000ML POUR BTL (IV SOLUTION) ×1 IMPLANT
PACK GENERAL/GYN (CUSTOM PROCEDURE TRAY) ×1 IMPLANT
PACK ORTHO EXTREMITY (CUSTOM PROCEDURE TRAY) ×1 IMPLANT
PAD ARMBOARD 7.5X6 YLW CONV (MISCELLANEOUS) ×2 IMPLANT
PAD CAST 4YDX4 CTTN HI CHSV (CAST SUPPLIES) IMPLANT
PADDING CAST ABS COTTON 4X4 ST (CAST SUPPLIES) IMPLANT
PADDING CAST COTTON 6X4 STRL (CAST SUPPLIES) IMPLANT
PLATE TIB MED 16H LT (Plate) IMPLANT
SCREW LOCK MDS 3.5X28 (Screw) IMPLANT
SCREW LOCK MDS 3.5X40 (Screw) IMPLANT
SCREW LOCK MDS 3.5X42 (Screw) IMPLANT
SCREW NL MDS 3.5X26 (Screw) IMPLANT
SCREW NLOCK 3.5X22 (Screw) IMPLANT
SCREW NLOCK 3.5X24 (Screw) IMPLANT
SCREW NLOCK 3.5X26 (Screw) IMPLANT
SCREW NLOCK 3.5X28 (Screw) IMPLANT
SCREW NLOCK 3.5X40 (Screw) IMPLANT
SCREW NLOCK 3.5X60 (Screw) IMPLANT
SCREW NLOCK MDS 3.5X48 (Screw) IMPLANT
SPLINT PLASTER CAST FAST 5X30 (CAST SUPPLIES) IMPLANT
SPONGE T-LAP 18X18 ~~LOC~~+RFID (SPONGE) ×1 IMPLANT
STAPLER VISISTAT 35W (STAPLE) IMPLANT
SUCTION TUBE FRAZIER 10FR DISP (SUCTIONS) ×1 IMPLANT
SUT ETHILON 2 0 FS 18 (SUTURE) ×2 IMPLANT
SUT PDS AB 2-0 CT1 27 (SUTURE) IMPLANT
SUT VIC AB 2-0 CT1 TAPERPNT 27 (SUTURE) ×2 IMPLANT
TOWEL GREEN STERILE (TOWEL DISPOSABLE) ×2 IMPLANT
TOWEL GREEN STERILE FF (TOWEL DISPOSABLE) ×1 IMPLANT
TUBE CONNECTING 12X1/4 (SUCTIONS) ×1 IMPLANT
UNDERPAD 30X36 HEAVY ABSORB (UNDERPADS AND DIAPERS) ×1 IMPLANT
WATER STERILE IRR 1000ML POUR (IV SOLUTION) ×1 IMPLANT

## 2023-02-20 NOTE — Assessment & Plan Note (Signed)
Hypertensive post-op, asymptomatic.  Not taking amlodipine at home, but other meds as below. -Restart hydrochlorothiazide 12.5 mg daily, losartan 100 mg daily

## 2023-02-20 NOTE — Telephone Encounter (Signed)
Auth Submission: NO AUTH NEEDED Site of care: Site of care: [facility] Payer: MEDICARE A/B Medication & CPT/J Code(s) submitted: Feraheme (ferumoxytol) [id number] Route of submission (phone, fax, portal):  Phone # Fax # Auth type: Buy/Bill PB Units/visits requested: X2 DOSES Reference number:  Approval from: 02/20/23 to 03/13/23

## 2023-02-20 NOTE — Transfer of Care (Signed)
Immediate Anesthesia Transfer of Care Note  Patient: Deborah Webb  Procedure(s) Performed: OPEN REDUCTION INTERNAL FIXATION (ORIF) OF LEFT TIBIA AND FIBULA (Left: Ankle)  Patient Location: PACU  Anesthesia Type:General  Level of Consciousness: awake and drowsy  Airway & Oxygen Therapy: Patient connected to face mask oxygen  Post-op Assessment: Report given to RN and Post -op Vital signs reviewed and stable  Post vital signs: Reviewed and stable  Last Vitals:  Vitals Value Taken Time  BP 162/87 02/20/23 1100  Temp    Pulse 79 02/20/23 1105  Resp 16 02/20/23 1105  SpO2 96 % 02/20/23 1105  Vitals shown include unfiled device data.  Last Pain:  Vitals:   02/20/23 0728  TempSrc: Oral  PainSc: 4       Patients Stated Pain Goal: 0 (02/19/23 1600)  Complications: No notable events documented.

## 2023-02-20 NOTE — Progress Notes (Signed)
Daily Progress Note Intern Pager: [phone]  Patient name: Deborah Webb Medical record number: [id number] Date of birth: [birth year] Age: 72 y.o. Gender: female  Primary Care Provider: Corine Shelter, MD Consultants: Orthopedics Code Status: Full code  Pt Overview and Major Events to Date:  12/2: Admitted to FMTS 12/10: ORIF of left tib/fib  Assessment and Plan: Deniqua Shroff is a 72 year old female with PMHx CKDIIIb, dementia, seizures, insomnia, HTN and HLD who presented after a fall from standing height with left tibia-fibula fracture and left clavicular fracture.  Underwent ORIF today, clinically stable postop.  Adjusted pain regimen to increase oxycodone scheduled dosing.  Will check postop H&H.  Restarting anti-hypertensives. Assessment & Plan Fall with Ankle fracture and clavicular fracture S/p ORIF with Ortho today, doing well postop final manage pain aggressively.  Clavicular fracture is stable, treated nonoperatively with sling. -Ortho following, appreciate recs -Pain regimen: -Tylenol 1000 Q6h sch -Increase to Oxy scheduled 5 mg BID, 5 mg Q6h PRN -Bowel reg: Miralax daily, senna docusate daily -PT/OT -Vit D and calcium supplementation Anemia Hemoglobin stable this a.m., will obtain postop H&H. -f/u post op H&H -Daily CBC -PO ferrous sulfate every other day, consider IV iron transfusion outpatient Essential hypertension Hypertensive post-op, asymptomatic.  Not taking amlodipine at home, but other meds as below. -Restart hydrochlorothiazide 12.5 mg daily, losartan 100 mg daily  Chronic and Stable Problems:  CKD IIIb: Avoid nephrotoxins Dementia: Continue Namenda and Aricept Seizures: Continue home Lamictal Insomnia: Continue to old home Seroquel HLD: Continue statin, continue to hold aspirin   FEN/GI: Regular PPx: Lovenox Dispo: SNF pending PT evaluation  Subjective:  Evaluated patient post-op.  She states she is having significant LLE pain  at the surgical site.  She also feels cold and is shivering under her covers, likely post-anaesthesia effects.  She is not hungry and did not eat her ordered lunch, but agrees to drink some water and eat some apple sauce.  Objective: Temp:  [97 F (36.1 C)-99.9 F (37.7 C)] 97.9 F (36.6 C) (12/10 1411) Pulse Rate:  [67-79] 79 (12/10 1411) Resp:  [13-21] 18 (12/10 1226) BP: (131-175)/(63-92) 171/92 (12/10 1411) SpO2:  [94 %-100 %] 96 % (12/10 1411) Physical Exam: General: Elderly female, thin, deconditioned, resting in bed, uncomfortable but not in acute distress, alert to self, situation, place. HEENT: MMM. Cardiovascular: Regular rate and rhythm. Normal S1/S2. No murmurs, rubs, or gallops appreciated. 2+ radial pulses. Pulmonary: Clear bilaterally to ascultation. No increased WOB, no accessory muscle usage on room air. No wheezes, rales, or crackles. Abdominal: Normoactive bowel sounds, nondistended. No tenderness to deep or light palpation. No rebound or guarding. No HSM. Skin: Warm and dry. Extremities: No peripheral edema bilaterally.  Capillary refill <2 seconds.  Laboratory: Most recent CBC Lab Results  Component Value Date   WBC 7.2 02/20/2023   HGB 9.7 (L) 02/20/2023   HCT 29.6 (L) 02/20/2023   MCV 95.8 02/20/2023   PLT 247 02/20/2023   Most recent BMP    Latest Ref Rng & Units 02/20/2023    5:42 AM  BMP  Glucose 70 - 99 mg/dL 536   BUN 8 - 23 mg/dL 28   Creatinine 6.44 - 1.00 mg/dL 0.34   Sodium 742 - 595 mmol/L 139   Potassium 3.5 - 5.1 mmol/L 4.3   Chloride 98 - 111 mmol/L 102   CO2 22 - 32 mmol/L 29   Calcium 8.9 - 10.3 mg/dL 9.1     Other pertinent labs: None pertinent  Imaging/Diagnostic Tests: DG Ankle Complete Left Result Date: 02/20/2023 IMPRESSION: Intraoperative fluoroscopic guidance for left ankle ORIF.    Elberta Fortis, MD 02/20/2023, 2:13 PM  PGY-2, [facility] FPTS Intern pager: [phone], text pages welcome Secure  chat group [facility] Teaching Service

## 2023-02-20 NOTE — Assessment & Plan Note (Addendum)
Hemoglobin stable this a.m., will obtain postop H&H. -f/u post op H&H -Daily CBC -PO ferrous sulfate every other day, consider IV iron transfusion outpatient

## 2023-02-20 NOTE — Op Note (Addendum)
02/20/2023  4:12 PM  PATIENT:  Deborah Webb  1950-10-11 female   MEDICAL RECORD NUMBER: [id number]  PRE-OPERATIVE DIAGNOSIS:  LEFT TIBIA AND FIBULA FRACTURES  POST-OPERATIVE DIAGNOSIS:   1. LEFT TIBIA AND FIBULA FRACTURES 2. Stable syndesmosis  PROCEDURE:   OPEN REDUCTION INTERNAL FIXATION OF LEFT TIBIA. MANUAL APPLICATION OF STRESS LEFT ANKLE SYNDESMOSIS UNDER FLUOROSCOPY  SURGEON:  Doralee Albino. Carola Frost, M.D.  ASSISTANT:  PA Student.  ANESTHESIA:  General.  COMPLICATIONS:  None.  TOURNIQUET: None.  I/O: Total I/O In: 1450 [I.V.:1000; IV Piggyback:450] Out: 70 [Blood:70]  DISPOSITION:  To PACU.  CONDITION:  Stable.  DELAY START OF DVT PROPHYLAXIS BECAUSE OF BLEEDING RISK: NO   BRIEF INDICATIONS FOR PROCEDURE:  SPRUHA Webb is 72 y.o. who sustained a fall resulting in severely comminuted tibial shaft and fibula fractures and was sent here for further evaluation and management with the referring physician asserting the need for a  dedicated orthopedic traumatologist.  I did discuss with the patient the complexity of the injury and recommended a submuscular repair.  Risks discussed included fracture above and below the plate, failure to heal, infection, which could be limb threatening and DVT, PE, loss of motion, arthritis, among others.  After full discussion of these risks, she did provide consent to proceed.  BRIEF SUMMARY OF PROCEDURE:  The patient was taken to the operating room where general anesthesia was induced.  The left lower extremity was prepped and draped in the usual sterile fashion after chlorhexidine wash, Betadine scrub and paint. No  tourniquet was used during the procedure.  Following timeout, the x-ray machine was brought in and AP and lateral views performed in order to de-rotate the fracture and obtain the best possible alignment.    An incision was made medially over the distal tibia with careful dissection to avoid injury to the saphenous vein.   The periosteum was left intact as we continued deep dissection.  The fracture site was identified and recognized as shortened.  I was able to introduce the plate up the shaft from the metaphysis, bringing it across the fracture site and advancing to the proximal shaft.  Plate length was selected to allow sufficient proximal fixation.  A standard screws was placed through the plate distally and then to lock screws while manipulating the distal fragment to be in the correct alignment on the coronal plane after correction of length.  Proximally I placed a single screw proximal to the end of the plate and then engaged the distraction device to push the plate and distal fragment distally.  This enabled restoration of length.  I then placed a standard screw just proximal to the fracture in the shaft.  I then placed additional screws distally and a single screw proximally to further dial in the reduction on both AP and lateral views.  To maximally oppose the plate to the bone I did first remove the distal locking screws and tightened the standard screw before replacing the locked once again.  My assistant pull traction during the reduction maneuver to restore length.  Final images showed appropriate reductional replacement, trajectory, and length.  The fibula was well aligned as a result of the tibial repair and did not therefore require independent reduction and fixation.   I then performed an external rotation stress view of the ankle under live fluoroscopy.  No syndesmotic widening nor widening of the medial clear space was identified to suggest a syndesmotic injury.  Wounds were irrigated once more and then closed  in standard layered fashion using 2-0 Vicryl and 3-0 nylon.  A sterile gently compressive dressing was applied, and then a posterior and stirrup splint with the ankle extended just above neutral.  An assistant was present, assisting throughout.  Assistant was necessary for this technically demanding  case.  Wounds were irrigated thoroughly, closed in standard layered fashion.  The patient was taken to the PACU in stable condition after application of a splint.  PROGNOSIS: The patient will be nonweightbearing in the splint with ice and elevation.  We will plan to see her back in the office in 10-14 days for removal of sutures and transition to a Cam boot with unrestricted range of motion of the ankle at that time.  Weightbearing at 6 weeks.

## 2023-02-20 NOTE — Anesthesia Procedure Notes (Signed)
Procedure Name: Intubation Date/Time: 02/20/2023 8:46 AM  Performed by: Heron Sabins, CRNAPre-anesthesia Checklist: Patient identified, Emergency Drugs available, Suction available and Patient being monitored Patient Re-evaluated:Patient Re-evaluated prior to induction Oxygen Delivery Method: Circle System Utilized Preoxygenation: Pre-oxygenation with 100% oxygen Induction Type: IV induction Ventilation: Mask ventilation without difficulty Laryngoscope Size: Mac and 3 Grade View: Grade I Tube type: Oral Tube size: 7.5 mm Number of attempts: 1 Airway Equipment and Method: Stylet and Oral airway Placement Confirmation: ETT inserted through vocal cords under direct vision, positive ETCO2 and breath sounds checked- equal and bilateral Secured at: 23 cm Tube secured with: Tape Dental Injury: Teeth and Oropharynx as per pre-operative assessment

## 2023-02-20 NOTE — Assessment & Plan Note (Addendum)
S/p ORIF with Ortho today, doing well postop final manage pain aggressively.  Clavicular fracture is stable, treated nonoperatively with sling. -Ortho following, appreciate recs -Pain regimen: -Tylenol 1000 Q6h sch -Increase to Oxy scheduled 5 mg BID, 5 mg Q6h PRN -Bowel reg: Miralax daily, senna docusate daily -PT/OT -Vit D and calcium supplementation

## 2023-02-20 NOTE — Progress Notes (Signed)
No changes overnight.  The risks and benefits of left tibia and fibula repair were discussed with the patient, including the possibility of infection, nerve injury, vessel injury, wound breakdown, arthritis, symptomatic hardware, DVT/ PE, loss of motion, malunion, nonunion, and need for further surgery among others.  We also specifically discussed the possible need to stage surgery because of the elevated risk of soft tissue breakdown that could lead to amputation.  These risks were acknowledged and consent was provided to proceed.  Myrene Galas, MD [facility], [facility] [phone]

## 2023-02-20 NOTE — Anesthesia Postprocedure Evaluation (Signed)
Anesthesia Post Note  Patient: Deborah Webb  Procedure(s) Performed: OPEN REDUCTION INTERNAL FIXATION (ORIF) OF LEFT TIBIA AND FIBULA (Left: Ankle)     Patient location during evaluation: PACU Anesthesia Type: General Level of consciousness: awake and alert Pain management: pain level controlled Vital Signs Assessment: post-procedure vital signs reviewed and stable Respiratory status: spontaneous breathing, nonlabored ventilation, respiratory function stable and patient connected to nasal cannula oxygen Cardiovascular status: blood pressure returned to baseline and stable Postop Assessment: no apparent nausea or vomiting Anesthetic complications: no  No notable events documented.  Last Vitals:  Vitals:   02/20/23 1200 02/20/23 1226  BP: (!) 162/82 (!) 164/82  Pulse: 67 68  Resp: 13 18  Temp: (!) 36.1 C 36.6 C  SpO2: 94% 95%    Last Pain:  Vitals:   02/20/23 1226  TempSrc: Oral  PainSc:                  [name] L Gilmore List

## 2023-02-20 NOTE — Plan of Care (Signed)

## 2023-02-21 ENCOUNTER — Encounter (HOSPITAL_COMMUNITY): Payer: Self-pay | Admitting: Orthopedic Surgery

## 2023-02-21 ENCOUNTER — Inpatient Hospital Stay (HOSPITAL_COMMUNITY): Payer: Medicare Other

## 2023-02-21 DIAGNOSIS — S82832A Other fracture of upper and lower end of left fibula, initial encounter for closed fracture: Secondary | ICD-10-CM | POA: Diagnosis not present

## 2023-02-21 LAB — CBC
HCT: 25.9 % — ABNORMAL LOW (ref 36.0–46.0)
HCT: 27.3 % — ABNORMAL LOW (ref 36.0–46.0)
Hemoglobin: 8.4 g/dL — ABNORMAL LOW (ref 12.0–15.0)
Hemoglobin: 8.8 g/dL — ABNORMAL LOW (ref 12.0–15.0)
MCH: 31.1 pg (ref 26.0–34.0)
MCH: 31.3 pg (ref 26.0–34.0)
MCHC: 32.4 g/dL (ref 30.0–36.0)
MCHC: 32.2 g/dL (ref 30.0–36.0)
MCV: 95.9 fL (ref 80.0–100.0)
MCV: 97.2 fL (ref 80.0–100.0)
Platelets: 259 10*3/uL (ref 150–400)
Platelets: 279 10*3/uL (ref 150–400)
RBC: 2.7 MIL/uL — ABNORMAL LOW (ref 3.87–5.11)
RBC: 2.81 MIL/uL — ABNORMAL LOW (ref 3.87–5.11)
RDW: 14.7 % (ref 11.5–15.5)
RDW: 14.8 % (ref 11.5–15.5)
WBC: 8.6 10*3/uL (ref 4.0–10.5)
WBC: 9.6 10*3/uL (ref 4.0–10.5)
nRBC: 0 % (ref 0.0–0.2)
nRBC: 0 % (ref 0.0–0.2)

## 2023-02-21 LAB — BASIC METABOLIC PANEL
Anion gap: 5 (ref 5–15)
BUN: 22 mg/dL (ref 8–23)
CO2: 27 mmol/L (ref 22–32)
Calcium: 8.7 mg/dL — ABNORMAL LOW (ref 8.9–10.3)
Chloride: 103 mmol/L (ref 98–111)
Creatinine, Ser: 1.18 mg/dL — ABNORMAL HIGH (ref 0.44–1.00)
GFR, Estimated: 49 mL/min — ABNORMAL LOW (ref >60)
Glucose, Bld: 91 mg/dL (ref 70–99)
Potassium: 3.6 mmol/L (ref 3.5–5.1)
Sodium: 135 mmol/L (ref 135–145)

## 2023-02-21 LAB — MAGNESIUM: Magnesium: 2.2 mg/dL (ref 1.7–2.4)

## 2023-02-21 NOTE — Assessment & Plan Note (Addendum)
Continue to be hypertensive overnight with expected drop today following medications.  Will continue to monitor and consider increasing medications if she continues to be hypertensive. -Restart hydrochlorothiazide 12.5 mg daily, losartan 100 mg daily

## 2023-02-21 NOTE — Care Management Important Message (Signed)
Important Message  Patient Details  Name: Deborah Webb MRN: [id number] Date of Birth: [birth year]   Important Message Given:  Yes - Medicare IM     Sherilyn Banker 02/21/2023, 2:03 PM

## 2023-02-21 NOTE — NC FL2 (Signed)
[place] MEDICAID FL2 LEVEL OF CARE FORM     IDENTIFICATION  Patient Name: Deborah Webb Birthdate: 11-Jul-1950 Sex: female Admission Date (Current Location): 02/12/2023  [facility] and [place] Number:  Producer, television/film/video and Address:  [facility] [place], [address], [place], [place] [address]      Provider Number: [id number]  Attending Physician Name and Address:  Billey Co, MD  Relative Name and Phone Number:  Marsela, Cokley [phone]    Current Level of Care: Hospital Recommended Level of Care: Skilled Nursing Facility Prior Approval Number:    Date Approved/Denied:   PASRR Number:    Discharge Plan: SNF    Current Diagnoses: Patient Active Problem List   Diagnosis Date Noted   Iron deficiency anemia 02/19/2023   Anemia 02/16/2023   Fall 02/14/2023   Fall with Ankle fracture and clavicular fracture 02/12/2023   Closed fracture of left distal tibia 02/12/2023   Closed fracture of left distal fibula 02/12/2023   Osteoporosis 02/12/2023   Clavicular fracture 02/12/2023   CKD (chronic kidney disease) stage 4, GFR 15-29 ml/min (HCC) 04/26/2021   Essential hypertension 04/26/2021   Chest pain 04/25/2021   History of traumatic brain injury 12/30/2020   Chronic insomnia 12/30/2020   Partial seizure (HCC) 12/05/2016   Dilantin toxicity 08/19/2016   Hypokalemia 08/19/2016   Dementia (HCC) 08/19/2016   Aneurysm of ascending aorta (HCC) 08/19/2016    Orientation RESPIRATION BLADDER Height & Weight     Self, Time, Situation, Place  Normal Continent Weight: 188 lb 15 oz (85.7 kg) Height:  5\' 11"  (180.3 cm)  BEHAVIORAL SYMPTOMS/MOOD NEUROLOGICAL BOWEL NUTRITION STATUS    Convulsions/Seizures Continent Diet (see discharge summary)  AMBULATORY STATUS COMMUNICATION OF NEEDS Skin   Total Care Verbally Surgical wounds                       Personal Care Assistance Level of Assistance  Bathing, Feeding, Dressing Bathing  Assistance: Maximum assistance Feeding assistance: Limited assistance Dressing Assistance: Maximum assistance     Functional Limitations Info  Sight, Hearing, Speech Sight Info: Adequate Hearing Info: Adequate Speech Info: Adequate    SPECIAL CARE FACTORS FREQUENCY  PT (By licensed PT), OT (By licensed OT)     PT Frequency: 5x week OT Frequency: 5x week            Contractures Contractures Info: Not present    Additional Factors Info  Code Status, Allergies Code Status Info: full Allergies Info: NKA           Current Medications (02/21/2023):  This is the current hospital active medication list Current Facility-Administered Medications  Medication Dose Route Frequency Provider Last Rate Last Admin   acetaminophen (TYLENOL) tablet 1,000 mg  1,000 mg Oral Q6H Montez Morita, PA-C   1,000 mg at 02/21/23 1236   amLODipine (NORVASC) tablet 5 mg  5 mg Oral Daily Montez Morita, PA-C   5 mg at 02/21/23 5621   atorvastatin (LIPITOR) tablet 10 mg  10 mg Oral Rona Ravens, PA-C   10 mg at 02/21/23 3086   calcium-vitamin D (OSCAL WITH D) 500-5 MG-MCG per tablet 2 tablet  2 tablet Oral Q breakfast Montez Morita, PA-C   2 tablet at 02/21/23 5784   donepezil (ARICEPT) tablet 10 mg  10 mg Oral q AM Montez Morita, PA-C   10 mg at 02/21/23 0549   enoxaparin (LOVENOX) injection 40 mg  40 mg Subcutaneous Daily Renae Fickle,  Mellody Dance, PA-C   40 mg at 02/21/23 4010   ferrous sulfate tablet 325 mg  325 mg Oral Pecola Leisure, PA-C   325 mg at 02/20/23 1309   losartan (COZAAR) tablet 100 mg  100 mg Oral Daily Reome, Earle J, RPH   100 mg at 02/21/23 2725   And   hydrochlorothiazide (HYDRODIURIL) tablet 12.5 mg  12.5 mg Oral Daily Reome, Earle J, RPH   12.5 mg at 02/21/23 3664   lamoTRIgine (LAMICTAL) tablet 100 mg  100 mg Oral BID Montez Morita, PA-C   100 mg at 02/21/23 0905   memantine (NAMENDA) tablet 10 mg  10 mg Oral BID Montez Morita, PA-C   10 mg at 02/21/23 4034   morphine (PF) 2 MG/ML injection 2  mg  2 mg Intravenous Q4H PRN Elberta Fortis, MD   2 mg at 02/20/23 2356   oxyCODONE (Oxy IR/ROXICODONE) immediate release tablet 5 mg  5 mg Oral BID Elberta Fortis, MD   5 mg at 02/21/23 7425   oxyCODONE (Oxy IR/ROXICODONE) immediate release tablet 5 mg  5 mg Oral Q8H PRN Elberta Fortis, MD   5 mg at 02/20/23 1632   polyethylene glycol (MIRALAX / GLYCOLAX) packet 17 g  17 g Oral Daily Montez Morita, PA-C   17 g at 02/21/23 9563   senna-docusate (Senokot-S) tablet 1 tablet  1 tablet Oral QHS Montez Morita, PA-C   1 tablet at 02/20/23 2123     Discharge Medications: Please see discharge summary for a list of discharge medications.  Relevant Imaging Results:  Relevant Lab Results:   Additional Information SSN: [id number]  Lorri Frederick, LCSW

## 2023-02-21 NOTE — Assessment & Plan Note (Addendum)
S/p ORIF with Ortho, clavicle fracture stable.  Pain adequately managed beginning to work with PT today. -Ortho following, appreciate recs -Pain regimen: -Tylenol 1000 Q6h sch -Increase to Oxy scheduled 5 mg BID, 5 mg Q6h PRN -Consider spot dose with PT if needed -Bowel reg: Miralax daily, senna docusate daily -PT/OT -Vit D and calcium supplementation

## 2023-02-21 NOTE — TOC Initial Note (Signed)
Transition of Care [facility]) - Initial/Assessment Note    Patient Details  Name: Deborah Webb MRN: [id number] Date of Birth: 1950/09/11  Transition of Care [facility]) CM/SW Contact:    Lorri Frederick, LCSW Phone Number: 02/21/2023, 3:11 PM  Clinical Narrative:   CSW met with pt regarding PT recommendation for SNF.  Pt from home with [name], permission given to speak with him.  No current services.  Pt agreeable to SNF, no preference indicate, medicare choice document provided, permission given to send out referral in hub.   Need to upload additional info to [facility].  Sent out in hub for SNF.                 Expected Discharge Plan: Skilled Nursing Facility Barriers to Discharge: Continued Medical Work up, SNF Pending bed offer   Patient Goals and CMS Choice Patient states their goals for this hospitalization and ongoing recovery are:: do normal routines CMS Medicare.gov Compare Post Acute Care list provided to:: Patient Choice offered to / list presented to : Patient      Expected Discharge Plan and Services In-house Referral: Clinical Social Work   Post Acute Care Choice: Skilled Nursing Facility Living arrangements for the past 2 months: Single Family Home                                      Prior Living Arrangements/Services Living arrangements for the past 2 months: Single Family Home Lives with:: Spouse Patient language and need for interpreter reviewed:: Yes Do you feel safe going back to the place where you live?: Yes      Need for Family Participation in Patient Care: Yes (Comment) Care giver support system in place?: Yes (comment) Current home services: Other (comment) (none) Criminal Activity/Legal Involvement Pertinent to Current Situation/Hospitalization: No - Comment as needed  Activities of Daily Living   ADL Screening (condition at time of admission) Independently performs ADLs?: Yes (appropriate for developmental age) Is the patient  deaf or have difficulty hearing?: No Does the patient have difficulty seeing, even when wearing glasses/contacts?: No Does the patient have difficulty concentrating, remembering, or making decisions?: No  Permission Sought/Granted Permission sought to share information with : Family Supports Permission granted to share information with : Yes, Verbal Permission Granted  Share Information with NAME: [name]  Permission granted to share info w AGENCY: SNF        Emotional Assessment Appearance:: Appears stated age Attitude/Demeanor/Rapport: Engaged Affect (typically observed): Appropriate, Pleasant Orientation: : Oriented to Self, Oriented to Place, Oriented to  Time, Oriented to Situation      Admission diagnosis:  Ankle fracture [S82.899A] Fall, initial encounter [W19.XXXA] Closed fracture of distal end of left tibia, unspecified fracture morphology, initial encounter [S82.302A] Closed fracture of distal end of left fibula, unspecified fracture morphology, initial encounter [S82.832A] Closed nondisplaced fracture of acromial end of left clavicle, initial encounter [S42.035A] Patient Active Problem List   Diagnosis Date Noted   Iron deficiency anemia 02/19/2023   Anemia 02/16/2023   Fall 02/14/2023   Fall with Ankle fracture and clavicular fracture 02/12/2023   Closed fracture of left distal tibia 02/12/2023   Closed fracture of left distal fibula 02/12/2023   Osteoporosis 02/12/2023   Clavicular fracture 02/12/2023   CKD (chronic kidney disease) stage 4, GFR 15-29 ml/min (HCC) 04/26/2021   Essential hypertension 04/26/2021   Chest pain 04/25/2021   History  of traumatic brain injury 12/30/2020   Chronic insomnia 12/30/2020   Partial seizure (HCC) 12/05/2016   Dilantin toxicity 08/19/2016   Hypokalemia 08/19/2016   Dementia (HCC) 08/19/2016   Aneurysm of ascending aorta (HCC) 08/19/2016   PCP:  Corine Shelter, MD Pharmacy:   [facility] [phone] -  [address], [place] - [address] AT [facility] [address] [place] [address] Phone: [phone] Fax: [phone]  [facility] - [place], [place] - [address] [place] [address] Phone: [phone] Fax: [phone]     Social Determinants of Health (SDOH) Social History: SDOH Screenings   Food Insecurity: No Food Insecurity (02/12/2023)  Housing: Low Risk  (02/12/2023)  Transportation Needs: No Transportation Needs (02/12/2023)  Utilities: Not At Risk (02/12/2023)  Tobacco Use: Low Risk  (02/20/2023)   SDOH Interventions:     Readmission Risk Interventions     No data to display

## 2023-02-21 NOTE — Progress Notes (Signed)
Occupational Therapy Re-eval Patient Details Name: Deborah Webb MRN: [id number] DOB: 07/06/1950 Today's Date: 02/21/2023   History of present illness Pt is a 72 y/o female presenting on 12/2 after fall.  Found with L tib/fib and L clavicular fx. S/p ORIF of L tibia and manual application of stress L ankle syndesmosis 12/10. PMH includes: seizures, HTN, dementia, CKD 4.   OT comments  Patient supine in bed, seen with PT s/p L ankle surgery yesterday. She is fatigued and reports pain was worse after surgery yesterday, but improved today.  Requires increased assist today for transfers, increased time to process and sequence today with step by step cues.  Max assist +2 to/from recliner, declined staying OOB today. Encouargement for all tasks, pt initially wanting to brush her teeth in bed. Noted decreased physical assist when transferring to L side compared to R side, but needing support  to keep L LE NWB during transfer.  Improved tolerance for L UE ROM, shoulder flexion to 90*; continued education of removing sling in bed to reduce dependence and sustained elbow flexion.  Will follow acutely, downgraded goals today.  Updated dc plan to <3hrs/day inpatient setting at dc.       If plan is discharge home, recommend the following:  A lot of help with bathing/dressing/bathroom;Assist for transportation;Direct supervision/assist for financial management;Direct supervision/assist for medications management;Assistance with cooking/housework;A lot of help with walking and/or transfers   Equipment Recommendations  BSC/3in1;Wheelchair (measurements OT);Wheelchair cushion (measurements OT) (drop arm BSC)    Recommendations for Other Services      Precautions / Restrictions Precautions Precautions: Fall Precaution Comments: per Montez Morita 12/3 no shoulder ROM restrictions, sling for comfort Required Braces or Orthoses: Sling;Splint/Cast Splint/Cast: LLE Splint/Cast - Date Prophylactic Dressing  Applied (if applicable): 02/20/23 Restrictions Weight Bearing Restrictions: Yes LUE Weight Bearing: Non weight bearing LLE Weight Bearing: Non weight bearing Other Position/Activity Restrictions: L UE in sling for comfort       Mobility Bed Mobility Overal bed mobility: Needs Assistance Bed Mobility: Sit to Supine, Supine to Sit, Rolling Rolling: Max assist   Supine to sit: Mod assist Sit to supine: Mod assist   General bed mobility comments: max assist to roll towards R side to remove soiled pad,  transitions into long sitting with min guard but unable to sustain upright when shifting Legs to EOB.  Requires assist with L LE towards EOB, but able to manage R LE (pt using UE to support R LE at times).  returned back to supine with support for LEs    Transfers Overall transfer level: Needs assistance Equipment used: None (bed pad assist) Transfers: Bed to chair/wheelchair/BSC            Lateral/Scoot Transfers: Max assist, +2 physical assistance General transfer comment: towards R side into recliner and towards L side back to bed.  Continues to requires setup by step cueing, noted decreased cueing and slightly increased ease going towards L side compared to R side but requires more assist to maintain NWB to L LE towards L side and pt able to maintain when transferring towards R side. L UE in sling.  Several losses of balance with posterior support needed.     Balance Overall balance assessment: Needs assistance Sitting-balance support: Single extremity supported, No upper extremity supported, Feet supported (R LE) Sitting balance-Leahy Scale: Fair Sitting balance - Comments: occasional posterior lean and LOB needing posterior support       Standing balance comment: deferred  ADL either performed or assessed with clinical judgement   ADL Overall ADL's : Needs assistance/impaired     Grooming: Sitting;Supervision/safety            Upper Body Dressing : Minimal assistance;Sitting Upper Body Dressing Details (indicate cue type and reason): sling managemet Lower Body Dressing: Maximal assistance;+2 for physical assistance;Sitting/lateral leans   Toilet Transfer: Maximal assistance;+2 for physical assistance Toilet Transfer Details (indicate cue type and reason): lateral scoot to/from recliner         Functional mobility during ADLs: Maximal assistance;+2 for physical assistance      Extremity/Trunk Assessment              Vision       Perception     Praxis      Cognition Arousal: Alert Behavior During Therapy: WFL for tasks assessed/performed Overall Cognitive Status: History of cognitive impairments - at baseline Area of Impairment: Problem solving, Following commands, Safety/judgement, Awareness, Attention, Memory                   Current Attention Level: Sustained Memory: Decreased short-term memory Following Commands: Follows one step commands consistently, Follows one step commands with increased time, Follows multi-step commands inconsistently Safety/Judgement: Decreased awareness of safety, Decreased awareness of deficits Awareness: Emergent Problem Solving: Slow processing, Difficulty sequencing, Requires verbal cues, Decreased initiation General Comments: pt able to correctly voice NWB to L UE/LE, requires cueing intermittently during activity to adhere.  She demonstrates decreased attention and sequence, likely from surgery yesterday and increased fatigue. Benefits from step by step commands.        Exercises Exercises: General Upper Extremity General Exercises - Upper Extremity Shoulder Flexion: AROM, Left, Supine, 5 reps (to 90*)    Shoulder Instructions       General Comments VSS, RN present providing medication; Splint to L LE intact.  Sheet changed when briefly up in recliner.    Pertinent Vitals/ Pain       Pain Assessment Pain Assessment: Faces Faces Pain  Scale: Hurts whole lot Pain Location: L ankle with all mvmt of LLE Pain Descriptors / Indicators: Discomfort, Grimacing, Guarding Pain Intervention(s): Limited activity within patient's tolerance, Monitored during session, Repositioned, Patient requesting pain meds-RN notified, Ice applied  Home Living                                          Prior Functioning/Environment              Frequency  Min 1X/week        Progress Toward Goals  OT Goals(current goals can now be found in the care plan section)  Progress towards OT goals: Goals drowngraded-see care plan  Acute Rehab OT Goals Patient Stated Goal: feel better, less pain OT Goal Formulation: With patient Time For Goal Achievement: 03/07/23 Potential to Achieve Goals: Good  Plan      Co-evaluation    PT/OT/SLP Co-Evaluation/Treatment: Yes Reason for Co-Treatment: For patient/therapist safety;To address functional/ADL transfers;Necessary to address cognition/behavior during functional activity   OT goals addressed during session: ADL's and self-care      AM-PAC OT "6 Clicks" Daily Activity     Outcome Measure   Help from another person eating meals?: A Little Help from another person taking care of personal grooming?: A Little Help from another person toileting, which includes using toliet, bedpan, or urinal?: A Lot Help from  another person bathing (including washing, rinsing, drying)?: A Lot Help from another person to put on and taking off regular upper body clothing?: A Little Help from another person to put on and taking off regular lower body clothing?: A Lot 6 Click Score: 15    End of Session Equipment Utilized During Treatment: Gait belt;Other (comment) (sling)  OT Visit Diagnosis: Other abnormalities of gait and mobility (R26.89);Muscle weakness (generalized) (M62.81);Pain;History of falling (Z91.81) Pain - Right/Left: Left Pain - part of body: Arm;Leg   Activity Tolerance  Patient tolerated treatment well   Patient Left in bed;with call bell/phone within reach;with bed alarm set;with nursing/sitter in room   Nurse Communication Mobility status;Weight bearing status;Patient requests pain meds        Time: 1610-9604 OT Time Calculation (min): 32 min  Charges: OT General Charges $OT Visit: 1 Visit OT Evaluation $OT Re-eval: 1 Re-eval  Barry Brunner, OT Acute Rehabilitation Services Office [phone]   Chancy Milroy 02/21/2023, 10:21 AM

## 2023-02-21 NOTE — Assessment & Plan Note (Addendum)
Moderate drop in hemoglobin overnight will obtain afternoon follow-up and monitor. -P.m. CBC -Daily CBC -PO ferrous sulfate every other day, consider IV iron transfusion outpatient

## 2023-02-21 NOTE — Plan of Care (Signed)

## 2023-02-21 NOTE — Progress Notes (Signed)
RE:  Deborah Webb      Date of Birth: 01-Aug-2050      Date:   02/21/23       To Whom It May Concern:  Please be advised that the above-named patient will require a short-term nursing home stay - anticipated 30 days or less for rehabilitation and strengthening.  The plan is for return home.                 MD signature                Date

## 2023-02-21 NOTE — Progress Notes (Signed)
Daily Progress Note Intern Pager: [phone]  Patient name: Deborah Webb Medical record number: [id number] Date of birth: 17-May-1950 Age: 72 y.o. Gender: female  Primary Care Provider: Corine Shelter, MD Consultants: Orthopedics Code Status: Full  Pt Overview and Major Events to Date:  12/2: Admitted to FM TS 12/10: ORIF with left tib-fib  Assessment and Plan:  Deborah Webb is a 72 year old woman with past medical history of CKD stage III, dementia, seizures, insomnia, HTN and HLD who presented after a fall from standing height with left tib-fib fracture and left clavicular fracture.  She is now postsurgical day 1 following ORIF and is doing well with no concerns. Assessment & Plan Fall with Ankle fracture and clavicular fracture S/p ORIF with Ortho, clavicle fracture stable.  Pain adequately managed beginning to work with PT today. -Ortho following, appreciate recs -Pain regimen: -Tylenol 1000 Q6h sch -Increase to Oxy scheduled 5 mg BID, 5 mg Q6h PRN -Consider spot dose with PT if needed -Bowel reg: Miralax daily, senna docusate daily -PT/OT -Vit D and calcium supplementation Anemia Moderate drop in hemoglobin overnight will obtain afternoon follow-up and monitor. -P.m. CBC -Daily CBC -PO ferrous sulfate every other day, consider IV iron transfusion outpatient Essential hypertension Continue to be hypertensive overnight with expected drop today following medications.  Will continue to monitor and consider increasing medications if she continues to be hypertensive. -Restart hydrochlorothiazide 12.5 mg daily, losartan 100 mg daily  Chronic and Stable Problems:  CKD stage III: Avoid nephrotoxins Dementia: Continue Namenda and Aricept Seizures: Continue home Lamictal Insomnia: Continue to hold home Seroquel HLD: Continue statin, continue to hold aspirin   FEN/GI: Regular PPx: Lovenox Dispo:SNF pending clinical improvement .   Subjective:  Patient is  awake and alert this morning on exam.  She reports that her pain is well-controlled and that she is feeling optimistic.  PT was about to work with her when I saw her this morning.  She understands that if she feels that her pain limits her ability to work with PT she will ask for extra pain meds.  Objective: Temp:  [97 F (36.1 C)-98.5 F (36.9 C)] 98.5 F (36.9 C) (12/11 0744) Pulse Rate:  [67-83] 83 (12/11 0744) Resp:  [13-21] 18 (12/11 0744) BP: (136-175)/(73-92) 136/73 (12/11 0744) SpO2:  [94 %-100 %] 100 % (12/11 0744) Physical Exam: General: Alert, oriented no apparent distress Cardiovascular: RRR, no M/R/G Respiratory: CTAB, no increased work of breathing Abdomen: Flat, soft, nontender. Extremities: Maintains ability to move distal lower extremity on the left side.  No signs of swelling or erythema above or below the wraps.   Laboratory: Most recent CBC Lab Results  Component Value Date   WBC 8.6 02/21/2023   HGB 8.4 (L) 02/21/2023   HCT 25.9 (L) 02/21/2023   MCV 95.9 02/21/2023   PLT 259 02/21/2023   Most recent BMP    Latest Ref Rng & Units 02/21/2023    5:24 AM  BMP  Glucose 70 - 99 mg/dL 91   BUN 8 - 23 mg/dL 22   Creatinine 0.27 - 1.00 mg/dL 2.53   Sodium 664 - 403 mmol/L 135   Potassium 3.5 - 5.1 mmol/L 3.6   Chloride 98 - 111 mmol/L 103   CO2 22 - 32 mmol/L 27   Calcium 8.9 - 10.3 mg/dL 8.7     Gerrit Heck, DO 02/21/2023, 8:25 AM  PGY-1, [facility] FPTS Intern pager: [phone], text pages welcome Secure chat group [facility] Family  Medicine [facility] Teaching Service

## 2023-02-21 NOTE — Progress Notes (Signed)
Physical Therapy Treatment Patient Details Name: Deborah Webb MRN: [id number] DOB: March 27, 1950 Today's Date: 02/21/2023   History of Present Illness Pt is a 72 y/o female presenting on 12/2 after fall.  Found with L tib/fib and L clavicular fx. S/p ORIF of L tibia and manual application of stress L ankle syndesmosis 12/10. PMH includes: seizures, HTN, dementia, CKD 4.    PT Comments  Pt in bed upon arrival and agreeable to PT session. Pt is s/p L ankle surgery 12/10 with increased levels of fatigue. Pt currently requires ModA for bed mobility with increased difficulty with L LE management. Pt needs MaxAx2 to perform lateral scoots with repeated cues for proper hand placement and to adhere to WB precautions. Pt does well with step by step instructions and focusing on one aspect of the transfer at a time. Pt did slightly better transferring to the left back into the bed, however, pt needed physical assist to keep L LE off the ground. Pt declined staying in the recliner and was reluctant to perform exercises due to fear of increasing pain. Downgraded goals today to reflect pt's current mobility status. Will continue to follow acutely. Pt would benefit from <3hrs post acute rehab.    If plan is discharge home, recommend the following: Direct supervision/assist for financial management;Assist for transportation;Help with stairs or ramp for entrance;Supervision due to cognitive status;A lot of help with bathing/dressing/bathroom;Two people to help with walking and/or transfers   Can travel by private vehicle     No  Equipment Recommendations  Other (comment) (next venue)       Precautions / Restrictions Precautions Precautions: Fall Precaution Comments: per Montez Morita 12/3 no shoulder ROM restrictions, sling for comfort Required Braces or Orthoses: Sling;Splint/Cast Splint/Cast: LLE Splint/Cast - Date Prophylactic Dressing Applied (if applicable): 02/20/23 Restrictions Weight Bearing  Restrictions: Yes LUE Weight Bearing: Non weight bearing LLE Weight Bearing: Non weight bearing Other Position/Activity Restrictions: L UE in sling for comfort     Mobility  Bed Mobility Overal bed mobility: Needs Assistance Bed Mobility: Sit to Supine, Supine to Sit, Rolling Rolling: Max assist   Supine to sit: Mod assist Sit to supine: Mod assist   General bed mobility comments: Needs MaxA for roll to R to remove soiled pad. Pt able to move into long sitting, however, needs assistance to maintain balance when moving LE's. Needs assist with moving L LE towards EOB, occasionally uses UE to assist. ModA for return to supine for LE management    Transfers Overall transfer level: Needs assistance Equipment used: None (bed pad assist) Transfers: Bed to chair/wheelchair/BSC            Lateral/Scoot Transfers: Max assist, +2 physical assistance General transfer comment: towards R side into recliner and towards L side back to bed.  Needs step by step cues for technique, set up, and WB precautions. Slightly increased ease going towards L side compared to R side, however, pt needed more assist to maintain NWB to L side. Several losses of balance with posterior support needed.    Ambulation/Gait    General Gait Details: unable         Balance Overall balance assessment: Needs assistance Sitting-balance support: Single extremity supported, No upper extremity supported, Feet supported (R LE) Sitting balance-Leahy Scale: Fair Sitting balance - Comments: occasional posterior lean and LOB needing posterior support       Standing balance comment: deferred     Cognition Arousal: Alert Behavior During Therapy: WFL for tasks assessed/performed Overall  Cognitive Status: History of cognitive impairments - at baseline Area of Impairment: Problem solving, Following commands, Safety/judgement, Awareness, Attention, Memory        Current Attention Level: Sustained Memory: Decreased  short-term memory Following Commands: Follows one step commands consistently, Follows one step commands with increased time, Follows multi-step commands inconsistently Safety/Judgement: Decreased awareness of safety, Decreased awareness of deficits Awareness: Emergent Problem Solving: Slow processing, Difficulty sequencing, Requires verbal cues, Decreased initiation General Comments: pt able to correctly voice NWB to L UE/LE, requires occaional cues to adhere. Difficulty sequencing tasks w/ step by step commands helpful.        Exercises General Exercises - Lower Extremity Long Arc Quad: AROM, Left, 5 reps, Seated    General Comments General comments (skin integrity, edema, etc.): VSS on RA, splint to L LE intact, bed linens changed when pt was in the recliner      Pertinent Vitals/Pain Pain Assessment Pain Assessment: Faces Faces Pain Scale: Hurts whole lot Pain Location: L ankle with all mvmt of LLE Pain Descriptors / Indicators: Discomfort, Grimacing, Guarding Pain Intervention(s): Limited activity within patient's tolerance, Monitored during session, Repositioned, Patient requesting pain meds-RN notified    Home Living Family/patient expects to be discharged to:: Private residence Living Arrangements: Spouse/significant other Available Help at Discharge: Family;Available 24 hours/day Type of Home: House Home Access: Stairs to enter Entrance Stairs-Rails: None Entrance Stairs-Number of Steps: 4   Home Layout: One level Home Equipment: Crutches;Grab bars - tub/shower;Rolling Walker (2 wheels) Additional Comments: pt did not use AD prior to admission; husband unable to provide physical assist        PT Goals (current goals can now be found in the care plan section) Acute Rehab PT Goals PT Goal Formulation: With patient Time For Goal Achievement: 02/27/23 Potential to Achieve Goals: Good Progress towards PT goals: Progressing toward goals    Frequency    Min  1X/week           Co-evaluation   Reason for Co-Treatment: For patient/therapist safety;To address functional/ADL transfers;Necessary to address cognition/behavior during functional activity   OT goals addressed during session: ADL's and self-care      AM-PAC PT "6 Clicks" Mobility   Outcome Measure  Help needed turning from your back to your side while in a flat bed without using bedrails?: A Lot Help needed moving from lying on your back to sitting on the side of a flat bed without using bedrails?: Total Help needed moving to and from a bed to a chair (including a wheelchair)?: Total Help needed standing up from a chair using your arms (e.g., wheelchair or bedside chair)?: Total Help needed to walk in hospital room?: Total Help needed climbing 3-5 steps with a railing? : Total 6 Click Score: 7    End of Session Equipment Utilized During Treatment: Gait belt Activity Tolerance: Patient tolerated treatment well Patient left: with call bell/phone within reach;in bed;with bed alarm set;with nursing/sitter in room Nurse Communication: Mobility status PT Visit Diagnosis: Unsteadiness on feet (R26.81);Other abnormalities of gait and mobility (R26.89);Muscle weakness (generalized) (M62.81);Pain Pain - Right/Left: Left Pain - part of body: Ankle and joints of foot     Time: 5956-3875 PT Time Calculation (min) (ACUTE ONLY): 33 min  Charges:      PT General Charges $$ ACUTE PT VISIT: 1 Visit                     Hilton Cork, PT, DPT Secure Chat Preferred  Rehab Office (  [phone]    Arturo Morton Brion Aliment 02/21/2023, 12:45 PM

## 2023-02-21 NOTE — Progress Notes (Signed)
Orthopaedic Trauma Service Progress Note  Patient ID: Deborah Webb MRN: [id number] DOB/AGE: Dec 08, 1950 72 y.o.  Subjective:  Pain much improved  No complaints   ROS As above  Objective:   VITALS:   Vitals:   02/20/23 1411 02/20/23 2056 02/21/23 0537 02/21/23 0744  BP: (!) 171/92 (!) 160/85 (!) 148/79 136/73  Pulse: 79 82 81 83  Resp:  20 17 18   Temp: 97.9 F (36.6 C)  98 F (36.7 C) 98.5 F (36.9 C)  TempSrc: Oral  Oral Oral  SpO2: 96% 98% 95% 100%  Weight:      Height:        Estimated body mass index is 26.35 kg/m as calculated from the following:   Height as of this encounter: 5\' 11"  (1.803 m).   Weight as of this encounter: 85.7 kg.   Intake/Output      12/10 0701 12/11 0700 12/11 0701 12/12 0700   P.O.  240   I.V. (mL/kg) 1000 (11.7)    IV Piggyback 450    Total Intake(mL/kg) 1450 (16.9) 240 (2.8)   Urine (mL/kg/hr) 1350 (0.7)    Blood 70    Total Output 1420    Net +30 +240          LABS  Results for orders placed or performed during the hospital encounter of 02/12/23 (from the past 24 hour(s))  Hemoglobin and hematocrit, blood     Status: Abnormal   Collection Time: 02/20/23  2:47 PM  Result Value Ref Range   Hemoglobin 9.9 (L) 12.0 - 15.0 g/dL   HCT 21.3 (L) 08.6 - 57.8 %  Basic metabolic panel     Status: Abnormal   Collection Time: 02/21/23  5:24 AM  Result Value Ref Range   Sodium 135 135 - 145 mmol/L   Potassium 3.6 3.5 - 5.1 mmol/L   Chloride 103 98 - 111 mmol/L   CO2 27 22 - 32 mmol/L   Glucose, Bld 91 70 - 99 mg/dL   BUN 22 8 - 23 mg/dL   Creatinine, Ser 4.69 (H) 0.44 - 1.00 mg/dL   Calcium 8.7 (L) 8.9 - 10.3 mg/dL   GFR, Estimated 49 (L) >60 mL/min   Anion gap 5 5 - 15  Magnesium     Status: None   Collection Time: 02/21/23  5:24 AM  Result Value Ref Range   Magnesium 2.2 1.7 - 2.4 mg/dL  CBC     Status: Abnormal   Collection Time: 02/21/23   5:24 AM  Result Value Ref Range   WBC 8.6 4.0 - 10.5 K/uL   RBC 2.70 (L) 3.87 - 5.11 MIL/uL   Hemoglobin 8.4 (L) 12.0 - 15.0 g/dL   HCT 62.9 (L) 52.8 - 41.3 %   MCV 95.9 80.0 - 100.0 fL   MCH 31.1 26.0 - 34.0 pg   MCHC 32.4 30.0 - 36.0 g/dL   RDW 24.4 01.0 - 27.2 %   Platelets 259 150 - 400 K/uL   nRBC 0.0 0.0 - 0.2 %     PHYSICAL EXAM:   Gen: sitting up in bed, NAD, looks good Ext:        Left Lower Extremity Dressing is clean, dry and intact SLS in place and fitting well  Hinged brace is fitting well and is unlocked  Extremity is warm  No DCT  Compartments are soft  No pain out of proportion with passive stretching of his toes or ankle  DPN, SPN, TN sensory functions are intact  EHL, FHL, lesser toe motor functions intact    Assessment/Plan: 1 Day Post-Op     Anti-infectives (From admission, onward)    Start     Dose/Rate Route Frequency Ordered Stop   02/20/23 1600  ceFAZolin (ANCEF) IVPB 2g/100 mL premix        2 g 200 mL/hr over 30 Minutes Intravenous Every 8 hours 02/20/23 1228 02/21/23 1559   02/20/23 0745  ceFAZolin (ANCEF) IVPB 2g/100 mL premix        2 g 200 mL/hr over 30 Minutes Intravenous To ShortStay Surgical 02/19/23 0935 02/21/23 0745   02/20/23 0715  ceFAZolin (ANCEF) IVPB 2g/100 mL premix        2 g 200 mL/hr over 30 Minutes Intravenous On call to O.R. 02/20/23 2841 02/20/23 0847     .  POD/HD#: 1  72 y/o female s/p fall with L distal tibia and fibula fracture    - fall   - L distal tibia and fibula fracture, left medial mall fracture s/p ORIF                NwB L leg x 6-8 weeks   PT/OT   Ice and elevate  Likely SNF   - Pain management:             Multimodal    - ABL anemia/Hemodynamics             Stable  monitor   - Medical issues              Per primary    - DVT/PE prophylaxis:             Lovenox    - ID:              Periop abx   - Metabolic Bone Disease:             Fragility fracture             Will need  osteoporosis workup as outpatient    - FEN/GI prophylaxis/Foley/Lines:             Reg diet   - Dispo:             Therapy evals  Likely SNF  Stable from ortho standpoint  Follow up in 3 weeks    Mearl Latin, PA-C [phone] (C) 02/21/2023, 9:42 AM  [facility] [address] [place] [address] [phone] Val [phone] (F)    After 5pm and on the weekends please log on to Amion, go to orthopaedics and the look under the [facility] Call for the provider(s) on call. You can also call our office at [phone] and then follow the prompts to be connected to the call team.  Patient ID: Deborah Webb, female   DOB: 1950/07/24, 72 y.o.   MRN: [id number]

## 2023-02-22 ENCOUNTER — Ambulatory Visit: Payer: Medicare Other | Admitting: Neurology

## 2023-02-22 DIAGNOSIS — D509 Iron deficiency anemia, unspecified: Secondary | ICD-10-CM | POA: Diagnosis not present

## 2023-02-22 DIAGNOSIS — S82302A Unspecified fracture of lower end of left tibia, initial encounter for closed fracture: Secondary | ICD-10-CM | POA: Diagnosis not present

## 2023-02-22 DIAGNOSIS — S82832A Other fracture of upper and lower end of left fibula, initial encounter for closed fracture: Secondary | ICD-10-CM | POA: Diagnosis not present

## 2023-02-22 DIAGNOSIS — S82892A Other fracture of left lower leg, initial encounter for closed fracture: Secondary | ICD-10-CM | POA: Diagnosis not present

## 2023-02-22 LAB — BASIC METABOLIC PANEL
Anion gap: 7 (ref 5–15)
BUN: 28 mg/dL — ABNORMAL HIGH (ref 8–23)
CO2: 27 mmol/L (ref 22–32)
Calcium: 8.9 mg/dL (ref 8.9–10.3)
Chloride: 104 mmol/L (ref 98–111)
Creatinine, Ser: 1.25 mg/dL — ABNORMAL HIGH (ref 0.44–1.00)
GFR, Estimated: 46 mL/min — ABNORMAL LOW (ref >60)
Glucose, Bld: 96 mg/dL (ref 70–99)
Potassium: 4 mmol/L (ref 3.5–5.1)
Sodium: 138 mmol/L (ref 135–145)

## 2023-02-22 LAB — CBC
HCT: 26.5 % — ABNORMAL LOW (ref 36.0–46.0)
Hemoglobin: 8.7 g/dL — ABNORMAL LOW (ref 12.0–15.0)
MCH: 31.3 pg (ref 26.0–34.0)
MCHC: 32.8 g/dL (ref 30.0–36.0)
MCV: 95.3 fL (ref 80.0–100.0)
Platelets: 286 10*3/uL (ref 150–400)
RBC: 2.78 MIL/uL — ABNORMAL LOW (ref 3.87–5.11)
RDW: 14.6 % (ref 11.5–15.5)
WBC: 8.4 10*3/uL (ref 4.0–10.5)
nRBC: 0 % (ref 0.0–0.2)

## 2023-02-22 MED ORDER — GLYCERIN (LAXATIVE) 2 G RE SUPP
1.0000 | Freq: Once | RECTAL | Status: DC
  Filled 2023-02-22: qty 1

## 2023-02-22 MED ORDER — OXYCODONE HCL 5 MG PO TABS: 5.0000 mg | ORAL_TABLET | Freq: Three times a day (TID) | ORAL | 0 refills | Status: DC | PRN

## 2023-02-22 MED ORDER — OXYCODONE HCL 5 MG PO TABS: 5.0000 mg | ORAL_TABLET | Freq: Three times a day (TID) | ORAL | 0 refills | Status: AC | PRN

## 2023-02-22 MED ORDER — AMLODIPINE BESYLATE 5 MG PO TABS: 5.0000 mg | ORAL_TABLET | Freq: Every day | ORAL | Status: AC

## 2023-02-22 MED ORDER — FERROUS SULFATE 325 (65 FE) MG PO TABS: 325.0000 mg | ORAL_TABLET | ORAL | Status: AC

## 2023-02-22 MED ORDER — POLYETHYLENE GLYCOL 3350 17 G PO PACK: 17.0000 g | PACK | Freq: Two times a day (BID) | ORAL | Status: DC

## 2023-02-22 MED ORDER — SENNOSIDES-DOCUSATE SODIUM 8.6-50 MG PO TABS: 2.0000 | ORAL_TABLET | Freq: Every day | ORAL | Status: DC

## 2023-02-22 MED ORDER — ENOXAPARIN SODIUM 40 MG/0.4ML IJ SOSY: 40.0000 mg | PREFILLED_SYRINGE | Freq: Every day | INTRAMUSCULAR | Status: AC

## 2023-02-22 NOTE — Discharge Summary (Signed)
Family Medicine Teaching [facility] Discharge Summary  Patient name: Deborah Webb Medical record number: [id number] Date of birth: 1951/01/08 Age: 72 y.o. Gender: female Date of Admission: 02/12/2023  Date of Discharge: 02/22/2023 Admitting Physician: Lincoln Brigham, MD  Primary Care Provider: Corine Shelter, MD Consultants: Orthopedic surgery  Indication for Hospitalization: fall with fracture  Discharge Diagnoses/Problem List:  Principal Problem for Admission: Fall with ankle and clavicular fracture Other Problems addressed during stay:  Principal Problem:   Fall with Ankle fracture and clavicular fracture Active Problems:   Hypokalemia   Essential hypertension   Closed fracture of left distal tibia   Closed fracture of left distal fibula   Osteoporosis   Clavicular fracture   Anemia   Brief Hospital Course:  Deborah Webb is a 72 y.o.female with a history of dementia, hypertension, hyperlipidemia, seizures who was admitted to the family medicine teaching Service at [facility] for fall. Her hospital course is detailed below:  Fall  L tib/fib fx  L clavicular fx Presented after mechanical fall where she lost her balance resulting in  displaced fracture of the left tib/fib, nondisplaced medial malleolus fracture, nondisplaced left distal clavicular fracture, possible nondisplaced second metatarsal left foot fracture. CT head and C-spine negative.  Orthopedics recommended delayed surgical left tib-fib repair due to extent of edema and supportive care of left clavicle fracture.  Swelling improved and she underwent ORIF on 12/10.  Given fragility fracture, started on calcium and vitamin D.  Discharged to SNF for continued PT/OT.  IDA Started on daily iron supplementation.  Remained stable throughout admission.  Other chronic conditions were medically managed with home medications and formulary alternatives as necessary (hypertension, hyperlipidemia, dementia,  seizures, CKDIV)  PCP Follow-up Recommendations: Discontinued aspirin as it seems this was added for primary prevention, please re-evaluate need for aspirin. Given history of CKD, could consider starting Prolia as bisphosphonates are renally limited for her hip fracture. Repeat CBC in 6-8 weeks and consider IV iron infusion outpatient. Per Ortho, 10-14 day f/u outpatient, should be weightbearing in 6 weeks.   Disposition: SNF  Discharge Condition: Stable  Discharge Exam:  Vitals:   02/22/23 0925 02/22/23 1029  BP: 122/76 122/76  Pulse: 78   Resp: 16   Temp: (!) 97.5 F (36.4 C)   SpO2: 98%    Performed by Dr. Lonie Peak: General: Alert, oriented.  No apparent distress Cardiovascular: RRR, no M/R/G Respiratory: CTAB, no increased work of breathing Abdomen: Flat, soft, nontender Extremities: Reduction in swelling of the left leg distal to the knee.  Patient remains neurovascularly intact below the site of the surgery.  Soft cast in place for hospitalization.  No evidence of bruising or bleeding.  Significant Procedures: ORIF of left tib-fib fracture  Significant Labs and Imaging:  Recent Labs  Lab 02/21/23 0524 02/21/23 1602 02/22/23 0520  WBC 8.6 9.6 8.4  HGB 8.4* 8.8* 8.7*  HCT 25.9* 27.3* 26.5*  PLT 259 279 286   Recent Labs  Lab 02/21/23 0524 02/22/23 0520  NA 135 138  K 3.6 4.0  CL 103 104  CO2 27 27  GLUCOSE 91 96  BUN 22 28*  CREATININE 1.18* 1.25*  CALCIUM 8.7* 8.9  MG 2.2  --    Results/Tests Pending at Time of Discharge: none  Discharge Medications:  Allergies as of 02/22/2023   No Known Allergies      Medication List     STOP taking these medications    aspirin EC 81 MG tablet  metoprolol succinate 50 MG 24 hr tablet Commonly known as: TOPROL-XL   metoprolol tartrate 50 MG tablet Commonly known as: LOPRESSOR       TAKE these medications    amLODipine 5 MG tablet Commonly known as: NORVASC Take 1 tablet (5 mg total) by  mouth daily. What changed:  medication strength how much to take   atorvastatin 10 MG tablet Commonly known as: LIPITOR Take 10 mg by mouth every morning.   donepezil 10 MG tablet Commonly known as: ARICEPT Take 1 tablet (10 mg total) by mouth in the morning.   enoxaparin 40 MG/0.4ML injection Commonly known as: LOVENOX Inject 0.4 mLs (40 mg total) into the skin daily for 14 days. Start taking on: February 23, 2023   ferrous sulfate 325 (65 FE) MG tablet Take 1 tablet (325 mg total) by mouth every other day.   lamoTRIgine 100 MG tablet Commonly known as: LaMICtal Take 1 tablet (100 mg total) by mouth 2 (two) times daily.   losartan-hydrochlorothiazide 100-12.5 MG tablet Commonly known as: HYZAAR Take 1 tablet by mouth daily.   memantine 10 MG tablet Commonly known as: NAMENDA TAKE 1 TABLET TWICE DAILY   oxyCODONE 5 MG immediate release tablet Commonly known as: Oxy IR/ROXICODONE Take 1 tablet (5 mg total) by mouth every 8 (eight) hours as needed for severe pain (pain score 7-10).   QUEtiapine 25 MG tablet Commonly known as: SEROquel Take 1 tablet (25 mg total) by mouth at bedtime.   Vitamin D (Ergocalciferol) 1.25 MG (50000 UNIT) Caps capsule Commonly known as: DRISDOL Take 50,000 Units by mouth once a week.        Discharge Instructions: Please refer to Patient Instructions section of EMR for full details.  Patient was counseled important signs and symptoms that should prompt return to medical care, changes in medications, dietary instructions, activity restrictions, and follow up appointments.   Follow-Up Appointments:  Contact information for follow-up providers     Myrene Galas, MD. Schedule an appointment as soon as possible for a visit in 2 week(s).   Specialty: Orthopedic Surgery Contact information: [address] [place] [address] [phone]              Contact information for after-discharge care     Destination      [facility] .   Service: Skilled Nursing Contact information: [address] [place] [address] [phone]                     Gerrit Heck, DO 02/22/2023, 11:52 AM PGY-1, [facility]    Upper Level Addendum:   I have reviewed the above note, making necessary revisions as appropriate.   Elberta Fortis, DO PGY-2, [name] Family Medicine Residency

## 2023-02-22 NOTE — TOC Progression Note (Addendum)
Transition of Care [facility]) - Progression Note    Patient Details  Name: Deborah Webb MRN: [id number] Date of Birth: 1950-05-09  Transition of [facility] CM/SW Contact  Lorri Frederick, LCSW Phone Number: 02/22/2023, 8:38 AM  Clinical Narrative:   Medicare payer with inpt order on 12/2.  Additional info uploaded to [employer].   1045: Passr received: [phone] A  Bed offers provided to pt and [name], who is in the room.  They will review.   1120: CSW spoke with pt and husband: they want to accept offer at [facility]  CSW spoke with [facility] and they can receive pt today.  MD team notified.  Expected Discharge Plan: Skilled Nursing Facility Barriers to Discharge: Continued Medical Work up, SNF Pending bed offer  Expected Discharge Plan and Services In-house Referral: Clinical Social Work   Post Acute Care Choice: Skilled Nursing Facility Living arrangements for the past 2 months: Single Family Home                                       Social Determinants of Health (SDOH) Interventions SDOH Screenings   Food Insecurity: No Food Insecurity (02/12/2023)  Housing: Low Risk  (02/12/2023)  Transportation Needs: No Transportation Needs (02/12/2023)  Utilities: Not At Risk (02/12/2023)  Tobacco Use: Low Risk  (02/20/2023)    Readmission Risk Interventions     No data to display

## 2023-02-22 NOTE — TOC Transition Note (Signed)
Transition of Care [facility]) - Discharge Note   Patient Details  Name: Deborah Webb MRN: [id number] Date of Birth: Dec 12, 1950  [facility] CM/SW Contact:  Lorri Frederick, LCSW Phone Number: 02/22/2023, 2:42 PM   Clinical Narrative:   Pt discharging to [facility].  RN call report to [phone].      Final next level of care: Skilled Nursing Facility Barriers to Discharge: Barriers Resolved   Patient Goals and CMS Choice Patient states their goals for this hospitalization and ongoing recovery are:: do normal routines CMS Medicare.gov Compare Post Acute Care list provided to:: Patient Choice offered to / list presented to : Patient      Discharge Placement              Patient chooses bed at:  [facility]) Patient to be transferred to facility by: [facility] Name of family member notified: husband [name] Patient and family notified of of transfer: 02/22/23  Discharge Plan and Services Additional resources added to the After Visit Summary for   In-house Referral: Clinical Social Work   Post Acute Care Choice: Skilled Nursing Facility                               Social Drivers of Health (SDOH) Interventions SDOH Screenings   Food Insecurity: No Food Insecurity (02/12/2023)  Housing: Low Risk  (02/12/2023)  Transportation Needs: No Transportation Needs (02/12/2023)  Utilities: Not At Risk (02/12/2023)  Tobacco Use: Low Risk  (02/20/2023)     Readmission Risk Interventions     No data to display

## 2023-02-22 NOTE — Assessment & Plan Note (Addendum)
Hemoglobin is stable overnight, continue to monitor while admitted.  -Daily CBC -PO ferrous sulfate every other day, consider IV iron transfusion outpatient

## 2023-02-22 NOTE — Assessment & Plan Note (Addendum)
S/p ORIF with Ortho, clavicle fracture stable.  Pain adequately managed. PT recommends SNF placement. TOC consulted, working on placement. Patient is having issues with BM, will increase bowel regimen accordingly.  -Ortho following, appreciate recs -Pain regimen: -Tylenol 1000 Q6h sch -Increase to Oxy scheduled 5 mg BID, 5 mg Q8h PRN -Consider spot dose with PT if needed -Bowel reg: Miralax BID, senna docusate two tabs daily -PT/OT -Vit D and calcium supplementation

## 2023-02-22 NOTE — Progress Notes (Signed)
Report called and given to nurse at [facility].

## 2023-02-22 NOTE — Assessment & Plan Note (Addendum)
Daytime BPs yesterday were in acceptable range, one elevated reading around 8pm. Continue to monitor, no changes at this time.  -continue hydrochlorothiazide 12.5 mg daily, losartan 100 mg daily

## 2023-02-22 NOTE — Discharge Instructions (Addendum)
Dear Deborah Webb,   Thank you for letting us participate in your care! In this section, you will find a brief hospital admission summary of why you were admitted to the hospital, what happened during your admission, your diagnosis/diagnoses, and recommended follow up.  You were diagnosed with a left tibial and fibula fracture (ankle) retreated with surgery by the orthopedic surgeons.   POST-HOSPITAL & CARE INSTRUCTIONS We recommend following up with your PCP within 1 week from being discharged from the hospital. Please let PCP/Specialists know of any changes in medications that were made which you will be able to see in the medications section of this packet. Please also follow up with orthopedic surgeons  DOCTOR'S APPOINTMENTS & FOLLOW UP Future Appointments  Date Time Provider Department Center  02/22/2023 11:15 AM Glean Salvo, NP [facility] None     Thank you for choosing [facility]! Take care and be well!  Family Medicine Teaching Service Inpatient Team [place]  [facility]  [address] [place], [place] [address] [phone]      Orthopaedic Trauma Service Discharge Instructions   General Discharge Instructions  Orthopaedic Injuries:  Left ankle fracture treated with open reduction and internal fixation using plate and screws  WEIGHT BEARING STATUS: nonweightbearing left leg   RANGE OF MOTION/ACTIVITY: no ankle motion as you are in a splint. Ok to move knee  Bone health:   Review the following resource for additional information regarding bone health  [email or web address]  Wound Care: do not remove splint. Keep splint clean and dry    Diet: as you were eating previously.  Can use over the counter stool softeners and bowel preparations, such as Miralax, to help with bowel movements.  Narcotics can be constipating.  Be sure to drink plenty of fluids  PAIN MEDICATION USE AND EXPECTATIONS  You have  likely been given narcotic medications to help control your pain.  After a traumatic event that results in an fracture (broken bone) with or without surgery, it is ok to use narcotic pain medications to help control one's pain.  We understand that everyone responds to pain differently and each individual patient will be evaluated on a regular basis for the continued need for narcotic medications. Ideally, narcotic medication use should last no more than 6-8 weeks (coinciding with fracture healing).   As a patient it is your responsibility as well to monitor narcotic medication use and report the amount and frequency you use these medications when you come to your office visit.   We would also advise that if you are using narcotic medications, you should take a dose prior to therapy to maximize you participation.  IF YOU ARE ON NARCOTIC MEDICATIONS IT IS NOT PERMISSIBLE TO OPERATE A MOTOR VEHICLE (MOTORCYCLE/CAR/TRUCK/MOPED) OR HEAVY MACHINERY DO NOT MIX NARCOTICS WITH OTHER CNS (CENTRAL NERVOUS SYSTEM) DEPRESSANTS SUCH AS ALCOHOL   POST-OPERATIVE OPIOID TAPER INSTRUCTIONS: It is important to wean off of your opioid medication as soon as possible. If you do not need pain medication after your surgery it is ok to stop day one. Opioids include: Codeine, Hydrocodone(Norco, Vicodin), Oxycodone(Percocet, oxycontin) and hydromorphone amongst others.  Long term and even short term use of opiods can cause: Increased pain response Dependence Constipation Depression Respiratory depression And more.  Withdrawal symptoms can include Flu like symptoms Nausea, vomiting And more Techniques to manage these symptoms Hydrate well Eat regular healthy meals Stay active Use relaxation techniques(deep breathing, meditating, yoga) Do Not substitute Alcohol to  help with tapering If you have been on opioids for less than two weeks and do not have pain than it is ok to stop all together.  Plan to wean off of  opioids This plan should start within one week post op of your fracture surgery  Maintain the same interval or time between taking each dose and first decrease the dose.  Cut the total daily intake of opioids by one tablet each day Next start to increase the time between doses. The last dose that should be eliminated is the evening dose.    STOP SMOKING OR USING NICOTINE PRODUCTS!!!!  As discussed nicotine severely impairs your body's ability to heal surgical and traumatic wounds but also impairs bone healing.  Wounds and bone heal by forming microscopic blood vessels (angiogenesis) and nicotine is a vasoconstrictor (essentially, shrinks blood vessels).  Therefore, if vasoconstriction occurs to these microscopic blood vessels they essentially disappear and are unable to deliver necessary nutrients to the healing tissue.  This is one modifiable factor that you can do to dramatically increase your chances of healing your injury.    (This means no smoking, no nicotine gum, patches, etc)  DO NOT USE NONSTEROIDAL ANTI-INFLAMMATORY DRUGS (NSAID'S)  Using products such as Advil (ibuprofen), Aleve (naproxen), Motrin (ibuprofen) for additional pain control during fracture healing can delay and/or prevent the healing response.  If you would like to take over the counter (OTC) medication, Tylenol (acetaminophen) is ok.  However, some narcotic medications that are given for pain control contain acetaminophen as well. Therefore, you should not exceed more than 4000 mg of tylenol in a day if you do not have liver disease.  Also note that there are may OTC medicines, such as cold medicines and allergy medicines that my contain tylenol as well.  If you have any questions about medications and/or interactions please ask your doctor/PA or your pharmacist.      ICE AND ELEVATE INJURED/OPERATIVE EXTREMITY  Using ice and elevating the injured extremity above your heart can help with swelling and pain control.  Icing in  a pulsatile fashion, such as 20 minutes on and 20 minutes off, can be followed.    Do not place ice directly on skin. Make sure there is a barrier between to skin and the ice pack.    Using frozen items such as frozen peas works well as the conform nicely to the are that needs to be iced.  USE AN ACE WRAP OR TED HOSE FOR SWELLING CONTROL  In addition to icing and elevation, Ace wraps or TED hose are used to help limit and resolve swelling.  It is recommended to use Ace wraps or TED hose until you are informed to stop.    When using Ace Wraps start the wrapping distally (farthest away from the body) and wrap proximally (closer to the body)   Example: If you had surgery on your leg or thing and you do not have a splint on, start the ace wrap at the toes and work your way up to the thigh        If you had surgery on your upper extremity and do not have a splint on, start the ace wrap at your fingers and work your way up to the upper arm  IF YOU ARE IN A SPLINT OR CAST DO NOT REMOVE IT FOR ANY REASON   If your splint gets wet for any reason please contact the office immediately. You may shower in your splint or  cast as long as you keep it dry.  This can be done by wrapping in a cast cover or garbage back (or similar)  Do Not stick any thing down your splint or cast such as pencils, money, or hangers to try and scratch yourself with.  If you feel itchy take benadryl as prescribed on the bottle for itching  IF YOU ARE IN A CAM BOOT (BLACK BOOT)  You may remove boot periodically. Perform daily dressing changes as noted below.  Wash the liner of the boot regularly and wear a sock when wearing the boot. It is recommended that you sleep in the boot until told otherwise    Call office for the following: Temperature greater than 101F Persistent nausea and vomiting Severe uncontrolled pain Redness, tenderness, or signs of infection (pain, swelling, redness, odor or green/yellow discharge around the  site) Difficulty breathing, headache or visual disturbances Hives Persistent dizziness or light-headedness Extreme fatigue Any other questions or concerns you may have after discharge  In an emergency, call 911 or go to an Emergency Department at a nearby hospital  HELPFUL INFORMATION  If you had a block, it will wear off between 8-24 hrs postop typically.  This is period when your pain may go from nearly zero to the pain you would have had postop without the block.  This is an abrupt transition but nothing dangerous is happening.  You may take an extra dose of narcotic when this happens.  You should wean off your narcotic medicines as soon as you are able.  Most patients will be off or using minimal narcotics before their first postop appointment.   We suggest you use the pain medication the first night prior to going to bed, in order to ease any pain when the anesthesia wears off. You should avoid taking pain medications on an empty stomach as it will make you nauseous.  Do not drink alcoholic beverages or take illicit drugs when taking pain medications.  In most states it is against the law to drive while you are in a splint or sling.  And certainly against the law to drive while taking narcotics.  You may return to work/school in the next couple of days when you feel up to it.   Pain medication may make you constipated.  Below are a few solutions to try in this order: Decrease the amount of pain medication if you aren't having pain. Drink lots of decaffeinated fluids. Drink prune juice and/or each dried prunes  If the first 3 don't work start with additional solutions Take Colace - an over-the-counter stool softener Take Senokot - an over-the-counter laxative Take Miralax - a stronger over-the-counter laxative     CALL THE OFFICE WITH ANY QUESTIONS OR CONCERNS: [phone]   VISIT OUR WEBSITE FOR ADDITIONAL INFORMATION: [email or web address]

## 2023-02-22 NOTE — Progress Notes (Signed)
Daily Progress Note Intern Pager: [phone]  Patient name: Deborah Webb Medical record number: [id number] Date of birth: [birth year] Age: 72 y.o. Gender: female  Primary Care Provider: Corine Shelter, MD Consultants: Orthopedics Code Status: Full  Pt Overview and Major Events to Date:  12/2: Admitted to [facility] 12/10: ORIF of left tib-fib fracture  Assessment and Plan:  Ryana Lautz is a 72 yo woman presenting after a fall from standing resulting in left clavicle fracture and tib-fib fracture. She is POD 2 following ORIF of tib-fib fracture and doing well. Medically stable, awaiting placement at SNF.  Assessment & Plan Fall with Ankle fracture and clavicular fracture S/p ORIF with Ortho, clavicle fracture stable.  Pain adequately managed. PT recommends SNF placement. TOC consulted, working on placement. Patient is having issues with BM, will increase bowel regimen accordingly.  -Ortho following, appreciate recs -Pain regimen: -Tylenol 1000 Q6h sch -Increase to Oxy scheduled 5 mg BID, 5 mg Q8h PRN -Consider spot dose with PT if needed -Bowel reg: Miralax BID, senna docusate two tabs daily -PT/OT -Vit D and calcium supplementation Anemia Hemoglobin is stable overnight, continue to monitor while admitted.  -Daily CBC -PO ferrous sulfate every other day, consider IV iron transfusion outpatient Essential hypertension Daytime BPs yesterday were in acceptable range, one elevated reading around 8pm. Continue to monitor, no changes at this time.  -continue hydrochlorothiazide 12.5 mg daily, losartan 100 mg daily  Chronic and Stable Problems:  CKD stage III: avoid nephrotoxins Dementia: continue namenda and aricept Seizures: continue lamictal Insomnia: continue to hold home seroquel HLD: continue statin, continue to hold aspirin   FEN/GI: Regular PPx: lovenox Dispo:SNF  placement .  Subjective:  Patient is awake and alert, resting comfortably in bed on exam. She  reports her pain is well controlled, and she does not feel she needs more pain medications to manage PT. She notes that she has not had a BM since Tuesday, and requested something to aid that.   Objective: Temp:  [98.1 F (36.7 C)-98.5 F (36.9 C)] 98.3 F (36.8 C) (12/11 2015) Pulse Rate:  [74-83] 77 (12/11 2015) Resp:  [17-18] 17 (12/11 2015) BP: (116-140)/(61-75) 140/75 (12/11 2015) SpO2:  [97 %-100 %] 100 % (12/11 2015) Physical Exam: General: Alert, oriented.  No apparent distress Cardiovascular: RRR, no M/R/G Respiratory: CTAB, no increased work of breathing Abdomen: Flat, soft, nontender Extremities: Reduction in swelling of the left leg distal to the knee.  Patient remains neurovascularly intact below the site of the surgery.  Soft cast in place for hospitalization.  No evidence of bruising or bleeding.  Laboratory: Most recent CBC Lab Results  Component Value Date   WBC 8.4 02/22/2023   HGB 8.7 (L) 02/22/2023   HCT 26.5 (L) 02/22/2023   MCV 95.3 02/22/2023   PLT 286 02/22/2023   Most recent BMP    Latest Ref Rng & Units 02/22/2023    5:20 AM  BMP  Glucose 70 - 99 mg/dL 96   BUN 8 - 23 mg/dL 28   Creatinine 0.27 - 1.00 mg/dL 2.53   Sodium 664 - 403 mmol/L 138   Potassium 3.5 - 5.1 mmol/L 4.0   Chloride 98 - 111 mmol/L 104   CO2 22 - 32 mmol/L 27   Calcium 8.9 - 10.3 mg/dL 8.9     Gerrit Heck, DO 02/22/2023, 7:29 AM  PGY-1, [facility]PTS Intern pager: [phone], text pages welcome Secure chat group [facility] Teaching Service

## 2023-04-16 DIAGNOSIS — S82832D Other fracture of upper and lower end of left fibula, subsequent encounter for closed fracture with routine healing: Secondary | ICD-10-CM | POA: Diagnosis not present

## 2023-04-16 DIAGNOSIS — N184 Chronic kidney disease, stage 4 (severe): Secondary | ICD-10-CM | POA: Diagnosis not present

## 2023-04-16 DIAGNOSIS — S82302D Unspecified fracture of lower end of left tibia, subsequent encounter for closed fracture with routine healing: Secondary | ICD-10-CM | POA: Diagnosis not present

## 2023-04-16 DIAGNOSIS — S82899D Other fracture of unspecified lower leg, subsequent encounter for closed fracture with routine healing: Secondary | ICD-10-CM | POA: Diagnosis not present

## 2023-04-16 DIAGNOSIS — I10 Essential (primary) hypertension: Secondary | ICD-10-CM | POA: Diagnosis not present

## 2023-04-16 DIAGNOSIS — S42009D Fracture of unspecified part of unspecified clavicle, subsequent encounter for fracture with routine healing: Secondary | ICD-10-CM | POA: Diagnosis not present

## 2023-04-16 DIAGNOSIS — F0390 Unspecified dementia without behavioral disturbance: Secondary | ICD-10-CM | POA: Diagnosis not present

## 2023-04-16 DIAGNOSIS — D509 Iron deficiency anemia, unspecified: Secondary | ICD-10-CM | POA: Diagnosis not present

## 2023-04-16 DIAGNOSIS — F5104 Psychophysiologic insomnia: Secondary | ICD-10-CM | POA: Diagnosis not present

## 2023-04-22 ENCOUNTER — Encounter: Payer: Self-pay | Admitting: Pulmonary Disease

## 2023-04-23 ENCOUNTER — Telehealth: Payer: Self-pay | Admitting: Pharmacy Technician

## 2023-04-23 NOTE — Telephone Encounter (Signed)
 error

## 2023-04-24 DIAGNOSIS — N184 Chronic kidney disease, stage 4 (severe): Secondary | ICD-10-CM | POA: Diagnosis not present

## 2023-04-24 DIAGNOSIS — F5104 Psychophysiologic insomnia: Secondary | ICD-10-CM | POA: Diagnosis not present

## 2023-04-24 DIAGNOSIS — S82899D Other fracture of unspecified lower leg, subsequent encounter for closed fracture with routine healing: Secondary | ICD-10-CM | POA: Diagnosis not present

## 2023-04-24 DIAGNOSIS — F0390 Unspecified dementia without behavioral disturbance: Secondary | ICD-10-CM | POA: Diagnosis not present

## 2023-04-24 DIAGNOSIS — S82302D Unspecified fracture of lower end of left tibia, subsequent encounter for closed fracture with routine healing: Secondary | ICD-10-CM | POA: Diagnosis not present

## 2023-04-24 DIAGNOSIS — S82832D Other fracture of upper and lower end of left fibula, subsequent encounter for closed fracture with routine healing: Secondary | ICD-10-CM | POA: Diagnosis not present

## 2023-04-24 DIAGNOSIS — I10 Essential (primary) hypertension: Secondary | ICD-10-CM | POA: Diagnosis not present

## 2023-04-24 DIAGNOSIS — D509 Iron deficiency anemia, unspecified: Secondary | ICD-10-CM | POA: Diagnosis not present

## 2023-04-24 DIAGNOSIS — S42009D Fracture of unspecified part of unspecified clavicle, subsequent encounter for fracture with routine healing: Secondary | ICD-10-CM | POA: Diagnosis not present

## 2023-04-26 ENCOUNTER — Telehealth: Payer: Self-pay

## 2023-04-26 ENCOUNTER — Other Ambulatory Visit: Payer: Self-pay

## 2023-04-26 DIAGNOSIS — F4323 Adjustment disorder with mixed anxiety and depressed mood: Secondary | ICD-10-CM | POA: Diagnosis not present

## 2023-04-26 NOTE — Telephone Encounter (Signed)
Auth Submission: NO AUTH NEEDED Site of care: Site of care: [facility] Payer: [employer] medicare Medication & CPT/J Code(s) submitted: Venofer (Iron Sucrose) J1756 Route of submission (phone, fax, portal):  Phone # Fax # Auth type: Buy/Bill PB Units/visits requested: 200mg  x 5 doses Reference number:  Approval from: 04/26/23 to 10/24/23   Selena Batten called the patient and confirmed that Deborah Webb is the preferred insurance. [employer] will not cover Feraheme until Venofer has been tried and Medicare A/B will not cover Venofer at all.

## 2023-04-30 DIAGNOSIS — S82832D Other fracture of upper and lower end of left fibula, subsequent encounter for closed fracture with routine healing: Secondary | ICD-10-CM | POA: Diagnosis not present

## 2023-04-30 DIAGNOSIS — N184 Chronic kidney disease, stage 4 (severe): Secondary | ICD-10-CM | POA: Diagnosis not present

## 2023-04-30 DIAGNOSIS — S42009D Fracture of unspecified part of unspecified clavicle, subsequent encounter for fracture with routine healing: Secondary | ICD-10-CM | POA: Diagnosis not present

## 2023-04-30 DIAGNOSIS — E559 Vitamin D deficiency, unspecified: Secondary | ICD-10-CM | POA: Diagnosis not present

## 2023-04-30 DIAGNOSIS — F5104 Psychophysiologic insomnia: Secondary | ICD-10-CM | POA: Diagnosis not present

## 2023-04-30 DIAGNOSIS — S82899D Other fracture of unspecified lower leg, subsequent encounter for closed fracture with routine healing: Secondary | ICD-10-CM | POA: Diagnosis not present

## 2023-04-30 DIAGNOSIS — Z131 Encounter for screening for diabetes mellitus: Secondary | ICD-10-CM | POA: Diagnosis not present

## 2023-04-30 DIAGNOSIS — S82302D Unspecified fracture of lower end of left tibia, subsequent encounter for closed fracture with routine healing: Secondary | ICD-10-CM | POA: Diagnosis not present

## 2023-04-30 DIAGNOSIS — F0390 Unspecified dementia without behavioral disturbance: Secondary | ICD-10-CM | POA: Diagnosis not present

## 2023-04-30 DIAGNOSIS — D509 Iron deficiency anemia, unspecified: Secondary | ICD-10-CM | POA: Diagnosis not present

## 2023-04-30 DIAGNOSIS — E785 Hyperlipidemia, unspecified: Secondary | ICD-10-CM | POA: Diagnosis not present

## 2023-04-30 DIAGNOSIS — I10 Essential (primary) hypertension: Secondary | ICD-10-CM | POA: Diagnosis not present

## 2023-05-04 DIAGNOSIS — R52 Pain, unspecified: Secondary | ICD-10-CM | POA: Diagnosis not present

## 2023-05-08 DIAGNOSIS — D509 Iron deficiency anemia, unspecified: Secondary | ICD-10-CM | POA: Diagnosis not present

## 2023-05-08 DIAGNOSIS — E559 Vitamin D deficiency, unspecified: Secondary | ICD-10-CM | POA: Diagnosis not present

## 2023-05-08 DIAGNOSIS — S42009D Fracture of unspecified part of unspecified clavicle, subsequent encounter for fracture with routine healing: Secondary | ICD-10-CM | POA: Diagnosis not present

## 2023-05-08 DIAGNOSIS — K5900 Constipation, unspecified: Secondary | ICD-10-CM | POA: Diagnosis not present

## 2023-05-08 DIAGNOSIS — F0393 Unspecified dementia, unspecified severity, with mood disturbance: Secondary | ICD-10-CM | POA: Diagnosis not present

## 2023-05-08 DIAGNOSIS — I10 Essential (primary) hypertension: Secondary | ICD-10-CM | POA: Diagnosis not present

## 2023-05-08 DIAGNOSIS — F5104 Psychophysiologic insomnia: Secondary | ICD-10-CM | POA: Diagnosis not present

## 2023-05-08 DIAGNOSIS — R52 Pain, unspecified: Secondary | ICD-10-CM | POA: Diagnosis not present

## 2023-05-08 DIAGNOSIS — S82832D Other fracture of upper and lower end of left fibula, subsequent encounter for closed fracture with routine healing: Secondary | ICD-10-CM | POA: Diagnosis not present

## 2023-05-08 DIAGNOSIS — E785 Hyperlipidemia, unspecified: Secondary | ICD-10-CM | POA: Diagnosis not present

## 2023-05-08 DIAGNOSIS — G40909 Epilepsy, unspecified, not intractable, without status epilepticus: Secondary | ICD-10-CM | POA: Diagnosis not present

## 2023-05-08 DIAGNOSIS — S82302D Unspecified fracture of lower end of left tibia, subsequent encounter for closed fracture with routine healing: Secondary | ICD-10-CM | POA: Diagnosis not present

## 2023-05-08 DIAGNOSIS — F0390 Unspecified dementia without behavioral disturbance: Secondary | ICD-10-CM | POA: Diagnosis not present

## 2023-05-08 DIAGNOSIS — S82899D Other fracture of unspecified lower leg, subsequent encounter for closed fracture with routine healing: Secondary | ICD-10-CM | POA: Diagnosis not present

## 2023-05-08 DIAGNOSIS — N184 Chronic kidney disease, stage 4 (severe): Secondary | ICD-10-CM | POA: Diagnosis not present

## 2023-05-09 DIAGNOSIS — S82252D Displaced comminuted fracture of shaft of left tibia, subsequent encounter for closed fracture with routine healing: Secondary | ICD-10-CM | POA: Diagnosis not present

## 2023-05-09 DIAGNOSIS — S82452D Displaced comminuted fracture of shaft of left fibula, subsequent encounter for closed fracture with routine healing: Secondary | ICD-10-CM | POA: Diagnosis not present

## 2023-05-17 ENCOUNTER — Encounter: Payer: Self-pay | Admitting: Pulmonary Disease
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
## 2023-05-22 ENCOUNTER — Ambulatory Visit (INDEPENDENT_AMBULATORY_CARE_PROVIDER_SITE_OTHER): Payer: Medicare Other | Admitting: Neurology

## 2023-05-22 ENCOUNTER — Encounter: Payer: Self-pay | Admitting: Neurology

## 2023-05-22 VITALS — BP 144/85 | HR 75 | Ht 69.0 in | Wt 178.0 lb

## 2023-05-22 DIAGNOSIS — R569 Unspecified convulsions: Secondary | ICD-10-CM

## 2023-05-22 MED ORDER — MEMANTINE HCL 10 MG PO TABS: 10.0000 mg | ORAL_TABLET | Freq: Two times a day (BID) | ORAL | 3 refills | Status: AC

## 2023-05-22 MED ORDER — DONEPEZIL HCL 10 MG PO TABS: 10.0000 mg | ORAL_TABLET | Freq: Every morning | ORAL | 1 refills | Status: DC

## 2023-05-22 MED ORDER — LAMOTRIGINE 100 MG PO TABS: 100.0000 mg | ORAL_TABLET | Freq: Two times a day (BID) | ORAL | 3 refills | Status: DC

## 2023-05-22 MED ORDER — QUETIAPINE FUMARATE 25 MG PO TABS
25.0000 mg | ORAL_TABLET | Freq: Every day | ORAL | 1 refills | Status: DC
Start: 2023-05-22 — End: 2023-12-19

## 2023-05-22 NOTE — Patient Instructions (Signed)
 Great to see you both today.  We will continue current medications.  Check labs today.  Please call for seizures.  Follow-up in 6 months.  Thanks!!

## 2023-05-23 LAB — CBC WITH DIFFERENTIAL/PLATELET
Basophils Absolute: 0 10*3/uL (ref 0.0–0.2)
Basos: 0 %
EOS (ABSOLUTE): 0 10*3/uL (ref 0.0–0.4)
Eos: 0 %
Hematocrit: 38.9 % (ref 34.0–46.6)
Hemoglobin: 13 g/dL (ref 11.1–15.9)
Immature Grans (Abs): 0 10*3/uL (ref 0.0–0.1)
Immature Granulocytes: 0 %
Lymphocytes Absolute: 1.4 10*3/uL (ref 0.7–3.1)
Lymphs: 19 %
MCH: 30.4 pg (ref 26.6–33.0)
MCHC: 33.4 g/dL (ref 31.5–35.7)
MCV: 91 fL (ref 79–97)
Monocytes Absolute: 0.5 10*3/uL (ref 0.1–0.9)
Monocytes: 6 %
Neutrophils Absolute: 5.5 10*3/uL (ref 1.4–7.0)
Neutrophils: 75 %
Platelets: 228 10*3/uL (ref 150–450)
RBC: 4.27 x10E6/uL (ref 3.77–5.28)
RDW: 12.6 % (ref 11.7–15.4)
WBC: 7.3 10*3/uL (ref 3.4–10.8)

## 2023-05-23 LAB — COMPREHENSIVE METABOLIC PANEL
ALT: 17 IU/L (ref 0–32)
AST: 16 IU/L (ref 0–40)
Albumin: 4.6 g/dL (ref 3.8–4.8)
Alkaline Phosphatase: 100 IU/L (ref 44–121)
BUN/Creatinine Ratio: 17 (ref 12–28)
BUN: 22 mg/dL (ref 8–27)
Bilirubin Total: 0.2 mg/dL (ref 0.0–1.2)
CO2: 23 mmol/L (ref 20–29)
Calcium: 9.8 mg/dL (ref 8.7–10.3)
Chloride: 100 mmol/L (ref 96–106)
Creatinine, Ser: 1.31 mg/dL — ABNORMAL HIGH (ref 0.57–1.00)
Globulin, Total: 2.9 g/dL (ref 1.5–4.5)
Glucose: 83 mg/dL (ref 70–99)
Potassium: 4 mmol/L (ref 3.5–5.2)
Sodium: 142 mmol/L (ref 134–144)
Total Protein: 7.5 g/dL (ref 6.0–8.5)
eGFR: 43 mL/min/{1.73_m2} — ABNORMAL LOW (ref >59)

## 2023-05-23 LAB — LAMOTRIGINE LEVEL: Lamotrigine Lvl: 21.1 ug/mL (ref 2.0–20.0)

## 2023-05-24 ENCOUNTER — Telehealth: Payer: Self-pay | Admitting: Neurology

## 2023-05-24 ENCOUNTER — Ambulatory Visit

## 2023-05-24 VITALS — BP 180/90 | HR 57 | Temp 97.5°F | Resp 20 | Ht 71.0 in | Wt 177.8 lb

## 2023-05-24 DIAGNOSIS — D509 Iron deficiency anemia, unspecified: Secondary | ICD-10-CM

## 2023-05-24 DIAGNOSIS — R569 Unspecified convulsions: Secondary | ICD-10-CM

## 2023-05-24 MED ORDER — ACETAMINOPHEN 325 MG PO TABS
650.0000 mg | ORAL_TABLET | Freq: Once | ORAL | Status: AC
  Administered 2023-05-24: 650 mg via ORAL
  Filled 2023-05-24: qty 2

## 2023-05-24 MED ORDER — IRON SUCROSE 20 MG/ML IV SOLN
200.0000 mg | Freq: Once | INTRAVENOUS | Status: AC
  Administered 2023-05-24: 200 mg via INTRAVENOUS
  Filled 2023-05-24: qty 10

## 2023-05-24 MED ORDER — DIPHENHYDRAMINE HCL 25 MG PO CAPS
25.0000 mg | ORAL_CAPSULE | Freq: Once | ORAL | Status: AC
  Administered 2023-05-24: 25 mg via ORAL
  Filled 2023-05-24: qty 1

## 2023-05-24 NOTE — Telephone Encounter (Signed)
 Please call her husband, ask her to come for repeat Lamictal level sometime next week 1st thing in AM before taking Lamictal to get baseline trough. This level was elevated at 21. No signs of toxicity on exam. Rest  of blood work is stable. HGB is back to normal, continues with mild elevated of creatinine 1.31.  Orders Placed This Encounter  Procedures   Lamotrigine level

## 2023-05-24 NOTE — Progress Notes (Signed)
 Diagnosis: Iron Deficiency Anemia  Provider:  Chilton Greathouse MD  Procedure: IV Push  IV Type: Peripheral, IV Location: L Forearm  Venofer (Iron Sucrose), Dose: 200 mg  Post Infusion IV Care: Observation period completed and Peripheral IV Discontinued  Discharge: Condition: Good, Destination: Home . AVS Provided  Performed by:  Loney Hering, LPN

## 2023-05-24 NOTE — Telephone Encounter (Signed)
 Call to patient and husband, they are in agreement to come next week for labs prior to taking lamictal.

## 2023-05-25 ENCOUNTER — Other Ambulatory Visit (INDEPENDENT_AMBULATORY_CARE_PROVIDER_SITE_OTHER): Payer: Self-pay

## 2023-05-25 DIAGNOSIS — R569 Unspecified convulsions: Secondary | ICD-10-CM | POA: Diagnosis not present

## 2023-05-25 DIAGNOSIS — Z0289 Encounter for other administrative examinations: Secondary | ICD-10-CM

## 2023-05-29 LAB — LAMOTRIGINE LEVEL: Lamotrigine Lvl: 18.6 ug/mL (ref 2.0–20.0)

## 2023-05-31 ENCOUNTER — Ambulatory Visit

## 2023-05-31 VITALS — BP 170/90 | HR 56 | Temp 98.0°F | Resp 16 | Ht 71.0 in | Wt 181.0 lb

## 2023-05-31 DIAGNOSIS — D509 Iron deficiency anemia, unspecified: Secondary | ICD-10-CM | POA: Diagnosis not present

## 2023-05-31 MED ORDER — DIPHENHYDRAMINE HCL 25 MG PO CAPS
25.0000 mg | ORAL_CAPSULE | Freq: Once | ORAL | Status: AC
Start: 2023-05-31 — End: 2023-05-31
  Administered 2023-05-31: 25 mg via ORAL
  Filled 2023-05-31: qty 1

## 2023-05-31 MED ORDER — ACETAMINOPHEN 325 MG PO TABS
650.0000 mg | ORAL_TABLET | Freq: Once | ORAL | Status: AC
  Administered 2023-05-31: 650 mg via ORAL
  Filled 2023-05-31: qty 2

## 2023-05-31 MED ORDER — SODIUM CHLORIDE 0.9 % IV BOLUS
250.0000 mL | Freq: Once | INTRAVENOUS | Status: DC
  Filled 2023-05-31: qty 250

## 2023-05-31 MED ORDER — IRON SUCROSE 20 MG/ML IV SOLN
200.0000 mg | Freq: Once | INTRAVENOUS | Status: AC
  Administered 2023-05-31: 200 mg via INTRAVENOUS
  Filled 2023-05-31: qty 10

## 2023-05-31 NOTE — Progress Notes (Signed)
 Diagnosis: Iron Deficiency Anemia  Provider:  Chilton Greathouse MD  Procedure: IV Push  IV Type: Peripheral, IV Location: L Antecubital  Venofer (Iron Sucrose), Dose: 200 mg  Post Infusion IV Care: Observation period completed. 15 minute observation per patient request.  Discharge: Condition: Good, Destination: Home . AVS Provided  Performed by:  Wyvonne Lenz, RN

## 2023-06-04 ENCOUNTER — Ambulatory Visit (INDEPENDENT_AMBULATORY_CARE_PROVIDER_SITE_OTHER)

## 2023-06-04 VITALS — BP 189/86 | HR 56 | Temp 97.4°F | Resp 16 | Ht 71.0 in | Wt 181.8 lb

## 2023-06-04 DIAGNOSIS — D509 Iron deficiency anemia, unspecified: Secondary | ICD-10-CM

## 2023-06-04 MED ORDER — IRON SUCROSE 20 MG/ML IV SOLN
200.0000 mg | Freq: Once | INTRAVENOUS | Status: AC
  Administered 2023-06-04: 200 mg via INTRAVENOUS

## 2023-06-04 MED ORDER — DIPHENHYDRAMINE HCL 25 MG PO CAPS
25.0000 mg | ORAL_CAPSULE | Freq: Once | ORAL | Status: AC
  Administered 2023-06-04: 25 mg via ORAL

## 2023-06-04 MED ORDER — ACETAMINOPHEN 325 MG PO TABS
650.0000 mg | ORAL_TABLET | Freq: Once | ORAL | Status: AC
  Administered 2023-06-04: 650 mg via ORAL

## 2023-06-04 NOTE — Progress Notes (Signed)
 Diagnosis: Iron Deficiency Anemia  Provider:  Chilton Greathouse MD  Procedure: IV Push  IV Type: Peripheral, IV Location: L Antecubital  Venofer (Iron Sucrose), Dose: 200 mg  Post Infusion IV Care: Observation period completed, 15 min per patient request  Discharge: Condition: Good, Destination: Home . AVS Provided  Performed by:  Marilynn Rail, RN

## 2023-06-06 ENCOUNTER — Ambulatory Visit

## 2023-06-06 VITALS — BP 217/103 | HR 51 | Temp 97.5°F | Resp 18 | Ht 71.0 in | Wt 181.6 lb

## 2023-06-06 DIAGNOSIS — D509 Iron deficiency anemia, unspecified: Secondary | ICD-10-CM

## 2023-06-06 MED ORDER — ACETAMINOPHEN 325 MG PO TABS
650.0000 mg | ORAL_TABLET | Freq: Once | ORAL | Status: AC
  Administered 2023-06-06: 650 mg via ORAL
  Filled 2023-06-06: qty 2

## 2023-06-06 MED ORDER — DIPHENHYDRAMINE HCL 25 MG PO CAPS
25.0000 mg | ORAL_CAPSULE | Freq: Once | ORAL | Status: AC
  Administered 2023-06-06: 25 mg via ORAL
  Filled 2023-06-06: qty 1

## 2023-06-06 MED ORDER — IRON SUCROSE 20 MG/ML IV SOLN
200.0000 mg | Freq: Once | INTRAVENOUS | Status: AC
  Administered 2023-06-06: 200 mg via INTRAVENOUS
  Filled 2023-06-06: qty 10

## 2023-06-06 NOTE — Progress Notes (Signed)
Diagnosis: Iron Deficiency Anemia  Provider:  Chilton Greathouse MD  Procedure: IV Push  IV Type: Peripheral, IV Location: R Forearm  Venofer (Iron Sucrose), Dose: 200 mg  Post Infusion IV Care: Observation period completed and Peripheral IV Discontinued  Discharge: Condition: Good, Destination: Home . AVS Declined  Performed by:  Rico Ala, LPN

## 2023-06-08 ENCOUNTER — Emergency Department (HOSPITAL_COMMUNITY)

## 2023-06-08 ENCOUNTER — Other Ambulatory Visit: Payer: Self-pay

## 2023-06-08 ENCOUNTER — Emergency Department (HOSPITAL_COMMUNITY)
Admission: EM | Admit: 2023-06-08 | Discharge: 2023-06-08 | Disposition: A | Discharge status: Home / Self Care | Attending: Emergency Medicine | Admitting: Emergency Medicine

## 2023-06-08 ENCOUNTER — Ambulatory Visit

## 2023-06-08 VITALS — BP 198/96 | HR 68 | Temp 98.0°F | Resp 18 | Ht 71.0 in | Wt 178.0 lb

## 2023-06-08 DIAGNOSIS — I159 Secondary hypertension, unspecified: Secondary | ICD-10-CM | POA: Insufficient documentation

## 2023-06-08 DIAGNOSIS — I10 Essential (primary) hypertension: Secondary | ICD-10-CM | POA: Diagnosis not present

## 2023-06-08 DIAGNOSIS — D509 Iron deficiency anemia, unspecified: Secondary | ICD-10-CM | POA: Diagnosis not present

## 2023-06-08 LAB — COMPREHENSIVE METABOLIC PANEL WITH GFR
ALT: 17 U/L (ref 0–44)
AST: 21 U/L (ref 15–41)
Albumin: 4.2 g/dL (ref 3.5–5.0)
Alkaline Phosphatase: 84 U/L (ref 38–126)
Anion gap: 12 (ref 5–15)
BUN: 19 mg/dL (ref 8–23)
CO2: 26 mmol/L (ref 22–32)
Calcium: 9.9 mg/dL (ref 8.9–10.3)
Chloride: 104 mmol/L (ref 98–111)
Creatinine, Ser: 1.26 mg/dL — ABNORMAL HIGH (ref 0.44–1.00)
GFR, Estimated: 45 mL/min — ABNORMAL LOW (ref >60)
Glucose, Bld: 89 mg/dL (ref 70–99)
Potassium: 4.1 mmol/L (ref 3.5–5.1)
Sodium: 142 mmol/L (ref 135–145)
Total Bilirubin: 0.7 mg/dL (ref 0.0–1.2)
Total Protein: 7.6 g/dL (ref 6.5–8.1)

## 2023-06-08 LAB — URINALYSIS, ROUTINE W REFLEX MICROSCOPIC
Bilirubin Urine: NEGATIVE
Glucose, UA: NEGATIVE mg/dL
Ketones, ur: NEGATIVE mg/dL
Nitrite: NEGATIVE
Protein, ur: NEGATIVE mg/dL
Specific Gravity, Urine: 1.01 (ref 1.005–1.030)
pH: 6.5 (ref 5.0–8.0)

## 2023-06-08 LAB — URINALYSIS, MICROSCOPIC (REFLEX)

## 2023-06-08 LAB — CBC
HCT: 43.1 % (ref 36.0–46.0)
Hemoglobin: 13.6 g/dL (ref 12.0–15.0)
MCH: 30.7 pg (ref 26.0–34.0)
MCHC: 31.6 g/dL (ref 30.0–36.0)
MCV: 97.3 fL (ref 80.0–100.0)
Platelets: 195 10*3/uL (ref 150–400)
RBC: 4.43 MIL/uL (ref 3.87–5.11)
RDW: 15.1 % (ref 11.5–15.5)
WBC: 7.6 10*3/uL (ref 4.0–10.5)
nRBC: 0 % (ref 0.0–0.2)

## 2023-06-08 MED ORDER — ACETAMINOPHEN 325 MG PO TABS
650.0000 mg | ORAL_TABLET | Freq: Once | ORAL | Status: AC
  Administered 2023-06-08: 650 mg via ORAL
  Filled 2023-06-08: qty 2

## 2023-06-08 MED ORDER — IRON SUCROSE 20 MG/ML IV SOLN
200.0000 mg | Freq: Once | INTRAVENOUS | Status: AC
  Administered 2023-06-08: 200 mg via INTRAVENOUS
  Filled 2023-06-08: qty 10

## 2023-06-08 MED ORDER — HYDRALAZINE HCL 25 MG PO TABS: 25.0000 mg | ORAL_TABLET | Freq: Two times a day (BID) | ORAL | 2 refills | Status: AC

## 2023-06-08 MED ORDER — DIPHENHYDRAMINE HCL 25 MG PO CAPS
25.0000 mg | ORAL_CAPSULE | Freq: Once | ORAL | Status: AC
  Administered 2023-06-08: 25 mg via ORAL
  Filled 2023-06-08: qty 1

## 2023-06-08 MED ORDER — HYDRALAZINE HCL 20 MG/ML IJ SOLN
10.0000 mg | Freq: Once | INTRAMUSCULAR | Status: AC
  Administered 2023-06-08: 10 mg via INTRAVENOUS
  Filled 2023-06-08: qty 1

## 2023-06-08 NOTE — ED Triage Notes (Signed)
 The pt is c/o her bp being high  for 5 days I think  she is alert and oriented  x 4  apparently she has had periods of confusion and was seen by her doctor earlier today  no pain anywhere

## 2023-06-08 NOTE — ED Provider Notes (Signed)
 [place] EMERGENCY DEPARTMENT AT [facility] Provider Note   CSN: [id number] Arrival date & time: 06/08/23  1711     History Chief Complaint  Patient presents with   Hypertension    HPI Deborah Webb is a 73 y.o. female presenting for elevated BP. Completely asymptomatic. Has been having elevated BP readings today in particular. Only detected because she had to go for iron infusion.  Denies fevers chills nausea.  Patient's recorded medical, surgical, social, medication list and allergies were reviewed in the Snapshot window as part of the initial history.   Review of Systems   Review of Systems  Constitutional:  Negative for chills and fever.  HENT:  Negative for ear pain and sore throat.   Eyes:  Negative for pain and visual disturbance.  Respiratory:  Negative for cough and shortness of breath.   Cardiovascular:  Negative for chest pain and palpitations.  Gastrointestinal:  Negative for abdominal pain and vomiting.  Genitourinary:  Negative for dysuria and hematuria.  Musculoskeletal:  Negative for arthralgias and back pain.  Skin:  Negative for color change and rash.  Neurological:  Negative for seizures and syncope.  All other systems reviewed and are negative.   Physical Exam Updated Vital Signs BP (!) 172/89   Pulse (!) 54   Temp 97.7 F (36.5 C)   Resp 17   Ht 5\' 11"  (1.803 m)   Wt 82.1 kg   SpO2 100%   BMI 25.24 kg/m  Physical Exam Constitutional:      General: She is not in acute distress.    Appearance: She is not ill-appearing or toxic-appearing.  HENT:     Head: Normocephalic and atraumatic.  Eyes:     Extraocular Movements: Extraocular movements intact.     Pupils: Pupils are equal, round, and reactive to light.  Cardiovascular:     Rate and Rhythm: Normal rate.  Pulmonary:     Effort: No respiratory distress.  Abdominal:     General: Abdomen is flat.  Musculoskeletal:        General: No swelling, deformity or signs of  injury.     Cervical back: Normal range of motion. No rigidity.  Skin:    General: Skin is warm and dry.  Neurological:     General: No focal deficit present.     Mental Status: She is alert and oriented to person, place, and time.  Psychiatric:        Mood and Affect: Mood normal.      ED Course/ Medical Decision Making/ A&P    Procedures Procedures   Medications Ordered in ED Medications  hydrALAZINE (APRESOLINE) injection 10 mg (10 mg Intravenous Given 06/08/23 2011)   Medical Decision Making:   Deborah Webb is a 73 y.o. female who presented to the ED today with elevated blood pressure readings detailed above.    Additional history discussed with patient's family/caregivers.  Patient placed on continuous vitals and telemetry monitoring while in ED which was reviewed periodically.  Complete initial physical exam performed, notably the patient  was hemodynamically stable no acute distress.    Reviewed and confirmed nursing documentation for past medical history, family history, social history.    Initial Assessment:   With the patient's presentation of elevated blood pressure readings, most likely diagnosis is hypertensive urgency. Other diagnoses associated with hypertensive emergency were considered including (but not limited to) intracranial hemorrhage, acute renal artery stenosis, acute kidney injury, myocardial stress, ophthalmologic emergencies. These are considered less  likely due to history of present illness and physical exam findings.   This is most consistent with an acute life/limb threatening illness complicated by underlying chronic conditions. Will evaluate for hypertensive emergency as below.  Initial Plan:  Given blood pressure readings in the 220s and reported confusion earlier in the day, will treat with hydralazine IV and plan for serial observations Screening labs including CBC and Metabolic panel to evaluate for infectious or metabolic etiology of  disease.  Urinalysis with reflex culture ordered to evaluate for UTI or relevant urologic/nephrologic pathology.  CXR to evaluate for structural/infectious intrathoracic pathology.  EKG to evaluate for cardiac pathology. Objective evaluation as below reviewed. Considered further administration of antihypertensives in ED, per consensus guidelines for [facility] of emergency physicians, acute treatment of hypertensive urgency alone in the emergency department is not recommended.  If patient has evidence of hypertensive emergency on objective laboratory evaluation, will reevaluate.  Will monitor blood pressure while patient awaiting above laboratory studies.  Initial Study Results:   Laboratory  All laboratory results reviewed without evidence of clinically relevant pathology.    EKG EKG was reviewed independently. Rate, rhythm, axis, intervals all examined and without medically relevant abnormality. ST segments without concerns for elevations.    Radiology:  All images reviewed independently. Agree with radiology report at this time.   DG Chest 2 View Result Date: 06/08/2023 CLINICAL DATA:  Hypertension. EXAM: CHEST - 2 VIEW COMPARISON:  February 12, 2023. FINDINGS: The heart size and mediastinal contours are within normal limits. Both lungs are clear. The visualized skeletal structures are unremarkable. IMPRESSION: No active cardiopulmonary disease. Electronically Signed   By: Lupita Raider M.D.   On: 06/08/2023 18:23    Reassessment and Plan:   Patient ultimately observed in the emergency room for 5 and half hours. Blood pressure is normalized with approximately 20% reduction.  I recommended that they follow-up with her primary care doctor for further recommendations on blood pressure management.  However they state that their primary care doctor is out with a severe orthopedic injury and will not return to office until July.  Recommended a follow-up with one of the other providers in  clinic but they have been told that they would not be able to get seen until July. Discussed management in the interim, they could follow-up with her cardiologist though they also stated it would take time to get in with them.  Given severe hypertension in the outpatient setting I do believe patient would benefit from more aggressive management. Will trial hydralazine p.o. twice daily given patient's already existing medication schedule.  She may have midday hypertension but this should provide relatively appropriate coverage for high blood pressure until she is able to get on more long-term therapy with her PCP. They are in agreement with treatment.  No evidence of acute hypertensive crisis otherwise patient ambulatory tolerating p.o. intake in no acute distress. Disposition:  I have considered need for hospitalization, however, considering all of the above, I believe this patient is stable for discharge at this time.  Patient/family educated about specific return precautions for given chief complaint and symptoms.  Patient/family educated about follow-up with PCP.     Patient/family expressed understanding of return precautions and need for follow-up. Patient spoken to regarding all imaging and laboratory results and appropriate follow up for these results. All education provided in verbal form with additional information in written form. Time was allowed for answering of patient questions. Patient discharged.    Emergency Department Medication  Summary:   Medications  hydrALAZINE (APRESOLINE) injection 10 mg (10 mg Intravenous Given 06/08/23 2011)          Clinical Impression:  1. Secondary hypertension      Discharge   Final Clinical Impression(s) / ED Diagnoses Final diagnoses:  Secondary hypertension    Rx / DC Orders ED Discharge Orders          Ordered    hydrALAZINE (APRESOLINE) 25 MG tablet  2 times daily        06/08/23 2247              Glyn Ade,  MD 06/08/23 2250

## 2023-06-08 NOTE — Progress Notes (Signed)
 Diagnosis: Iron Deficiency Anemia  Provider:  Chilton Greathouse MD  Procedure: IV Infusion  IV Type: Peripheral, IV Location: L Forearm  Venofer (Iron Sucrose), Dose: 200 mg IVP  Pt has high BP but denies any symptoms. Pt and her husband verbalized taking all her BP meds at home. Pt's husband called to primary care to established her care. Advised pt  to go to the ER for further evaluation for high BP. Both verbalized understanding and agreed to go to ER.  Post Infusion IV Care: Observation period completed and Peripheral IV Discontinued  Discharge: Condition: Good, Destination: Home . AVS Provided  Performed by:  Garnette Czech, RN

## 2023-06-11 DIAGNOSIS — F03918 Unspecified dementia, unspecified severity, with other behavioral disturbance: Secondary | ICD-10-CM | POA: Diagnosis not present

## 2023-06-11 DIAGNOSIS — E782 Mixed hyperlipidemia: Secondary | ICD-10-CM | POA: Diagnosis not present

## 2023-06-11 DIAGNOSIS — I10 Essential (primary) hypertension: Secondary | ICD-10-CM | POA: Diagnosis not present

## 2023-06-11 DIAGNOSIS — D509 Iron deficiency anemia, unspecified: Secondary | ICD-10-CM | POA: Diagnosis not present

## 2023-06-20 DIAGNOSIS — S82452D Displaced comminuted fracture of shaft of left fibula, subsequent encounter for closed fracture with routine healing: Secondary | ICD-10-CM | POA: Diagnosis not present

## 2023-06-20 DIAGNOSIS — S82252D Displaced comminuted fracture of shaft of left tibia, subsequent encounter for closed fracture with routine healing: Secondary | ICD-10-CM | POA: Diagnosis not present

## 2023-06-21 DIAGNOSIS — I10 Essential (primary) hypertension: Secondary | ICD-10-CM | POA: Diagnosis not present

## 2023-07-10 DIAGNOSIS — D509 Iron deficiency anemia, unspecified: Secondary | ICD-10-CM | POA: Diagnosis not present

## 2023-07-10 DIAGNOSIS — Z1211 Encounter for screening for malignant neoplasm of colon: Secondary | ICD-10-CM | POA: Diagnosis not present

## 2023-11-22 DIAGNOSIS — I10 Essential (primary) hypertension: Secondary | ICD-10-CM | POA: Diagnosis not present

## 2023-12-11 DIAGNOSIS — I10 Essential (primary) hypertension: Secondary | ICD-10-CM | POA: Diagnosis not present

## 2023-12-11 DIAGNOSIS — E782 Mixed hyperlipidemia: Secondary | ICD-10-CM | POA: Diagnosis not present

## 2023-12-19 ENCOUNTER — Other Ambulatory Visit: Payer: Self-pay

## 2023-12-19 MED ORDER — QUETIAPINE FUMARATE 25 MG PO TABS: 25.0000 mg | ORAL_TABLET | Freq: Every day | ORAL | 1 refills | Status: DC

## 2023-12-19 NOTE — Progress Notes (Unsigned)
 Refill requested by pharmacy

## 2023-12-21 ENCOUNTER — Telehealth: Payer: Self-pay

## 2023-12-21 ENCOUNTER — Other Ambulatory Visit (HOSPITAL_COMMUNITY): Payer: Self-pay

## 2023-12-21 DIAGNOSIS — E559 Vitamin D deficiency, unspecified: Secondary | ICD-10-CM | POA: Diagnosis not present

## 2023-12-21 DIAGNOSIS — D509 Iron deficiency anemia, unspecified: Secondary | ICD-10-CM | POA: Diagnosis not present

## 2023-12-21 DIAGNOSIS — Z1331 Encounter for screening for depression: Secondary | ICD-10-CM | POA: Diagnosis not present

## 2023-12-21 DIAGNOSIS — F03918 Unspecified dementia, unspecified severity, with other behavioral disturbance: Secondary | ICD-10-CM | POA: Diagnosis not present

## 2023-12-21 DIAGNOSIS — E782 Mixed hyperlipidemia: Secondary | ICD-10-CM | POA: Diagnosis not present

## 2023-12-21 DIAGNOSIS — I10 Essential (primary) hypertension: Secondary | ICD-10-CM | POA: Diagnosis not present

## 2023-12-21 DIAGNOSIS — Z131 Encounter for screening for diabetes mellitus: Secondary | ICD-10-CM | POA: Diagnosis not present

## 2023-12-21 DIAGNOSIS — Z0000 Encounter for general adult medical examination without abnormal findings: Secondary | ICD-10-CM | POA: Diagnosis not present

## 2023-12-21 NOTE — Telephone Encounter (Signed)
 Pharmacy Patient Advocate Encounter   Received notification from Fax that prior authorization for Quetiapine  is required/requested.   Insurance verification completed.   The patient is insured through [employer].   Per test claim: Refill too soon. PA is not needed at this time. Medication was filled 12/19/2023. Next eligible fill date is 03/03/2024.

## 2024-01-01 ENCOUNTER — Encounter: Payer: Self-pay | Admitting: Neurology

## 2024-01-01 ENCOUNTER — Ambulatory Visit: Admitting: Neurology

## 2024-01-01 VITALS — BP 149/89 | HR 50 | Ht 71.0 in | Wt 183.0 lb

## 2024-01-01 DIAGNOSIS — F03A0 Unspecified dementia, mild, without behavioral disturbance, psychotic disturbance, mood disturbance, and anxiety: Secondary | ICD-10-CM

## 2024-01-01 DIAGNOSIS — Z87820 Personal history of traumatic brain injury: Secondary | ICD-10-CM | POA: Diagnosis not present

## 2024-01-01 DIAGNOSIS — R569 Unspecified convulsions: Secondary | ICD-10-CM

## 2024-01-01 MED ORDER — QUETIAPINE FUMARATE 25 MG PO TABS: 50.0000 mg | ORAL_TABLET | Freq: Every day | ORAL | 1 refills | Status: AC

## 2024-01-01 MED ORDER — LAMOTRIGINE 100 MG PO TABS: 100.0000 mg | ORAL_TABLET | Freq: Two times a day (BID) | ORAL | 3 refills | Status: AC

## 2024-01-01 NOTE — Patient Instructions (Signed)
 Try taking Seroquel  25 mg 1.5 tablets at bedtime to see if helps with sleeping. Continue other medications. Make sure you are taking Aricept  in the morning.
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
## 2024-01-02 DIAGNOSIS — M25572 Pain in left ankle and joints of left foot: Secondary | ICD-10-CM | POA: Diagnosis not present

## 2024-01-02 DIAGNOSIS — S82452D Displaced comminuted fracture of shaft of left fibula, subsequent encounter for closed fracture with routine healing: Secondary | ICD-10-CM | POA: Diagnosis not present

## 2024-01-02 DIAGNOSIS — T8484XA Pain due to internal orthopedic prosthetic devices, implants and grafts, initial encounter: Secondary | ICD-10-CM | POA: Diagnosis not present

## 2024-01-02 DIAGNOSIS — S82252D Displaced comminuted fracture of shaft of left tibia, subsequent encounter for closed fracture with routine healing: Secondary | ICD-10-CM | POA: Diagnosis not present

## 2024-01-10 DIAGNOSIS — Z860100 Personal history of colon polyps, unspecified: Secondary | ICD-10-CM | POA: Diagnosis not present

## 2024-01-11 DIAGNOSIS — E782 Mixed hyperlipidemia: Secondary | ICD-10-CM | POA: Diagnosis not present

## 2024-01-11 DIAGNOSIS — I10 Essential (primary) hypertension: Secondary | ICD-10-CM | POA: Diagnosis not present

## 2024-01-20 DIAGNOSIS — I10 Essential (primary) hypertension: Secondary | ICD-10-CM | POA: Diagnosis not present

## 2024-01-23 DIAGNOSIS — Z1211 Encounter for screening for malignant neoplasm of colon: Secondary | ICD-10-CM | POA: Diagnosis not present

## 2024-01-23 DIAGNOSIS — K635 Polyp of colon: Secondary | ICD-10-CM | POA: Diagnosis not present

## 2024-01-23 DIAGNOSIS — Z860100 Personal history of colon polyps, unspecified: Secondary | ICD-10-CM | POA: Diagnosis not present

## 2024-01-29 ENCOUNTER — Other Ambulatory Visit: Payer: Self-pay

## 2024-01-29 MED ORDER — DONEPEZIL HCL 10 MG PO TABS: 10.0000 mg | ORAL_TABLET | Freq: Every morning | ORAL | 3 refills | Status: AC

## 2024-02-10 DIAGNOSIS — I10 Essential (primary) hypertension: Secondary | ICD-10-CM | POA: Diagnosis not present

## 2024-02-10 DIAGNOSIS — E782 Mixed hyperlipidemia: Secondary | ICD-10-CM | POA: Diagnosis not present

## 2024-08-05 ENCOUNTER — Ambulatory Visit: Admitting: Neurology
